# Patient Record
Sex: Female | Born: 1935 | Race: White | Hispanic: No | Marital: Married | Smoking: Former smoker
Health system: Southern US, Community
[De-identification: ages and names within clinical notes are randomized; demographics above are authoritative.]

## PROBLEM LIST (undated history)

## (undated) DIAGNOSIS — H269 Unspecified cataract: Secondary | ICD-10-CM

## (undated) DIAGNOSIS — I10 Essential (primary) hypertension: Secondary | ICD-10-CM

## (undated) DIAGNOSIS — F419 Anxiety disorder, unspecified: Secondary | ICD-10-CM

## (undated) DIAGNOSIS — R404 Transient alteration of awareness: Secondary | ICD-10-CM

## (undated) DIAGNOSIS — F0280 Dementia in other diseases classified elsewhere without behavioral disturbance: Secondary | ICD-10-CM

## (undated) DIAGNOSIS — G309 Alzheimer's disease, unspecified: Secondary | ICD-10-CM

## (undated) DIAGNOSIS — F02818 Dementia in other diseases classified elsewhere, unspecified severity, with other behavioral disturbance: Secondary | ICD-10-CM

## (undated) DIAGNOSIS — Z1211 Encounter for screening for malignant neoplasm of colon: Secondary | ICD-10-CM

## (undated) DIAGNOSIS — F329 Major depressive disorder, single episode, unspecified: Secondary | ICD-10-CM

## (undated) DIAGNOSIS — G4700 Insomnia, unspecified: Secondary | ICD-10-CM

## (undated) DIAGNOSIS — F32A Depression, unspecified: Secondary | ICD-10-CM

## (undated) DIAGNOSIS — M1990 Unspecified osteoarthritis, unspecified site: Secondary | ICD-10-CM

## (undated) DIAGNOSIS — F0281 Dementia in other diseases classified elsewhere with behavioral disturbance: Secondary | ICD-10-CM

## (undated) HISTORY — DX: Dementia in other diseases classified elsewhere with behavioral disturbance: F02.81

## (undated) HISTORY — PX: SMALL INTESTINE SURGERY: SHX150

## (undated) HISTORY — DX: Dementia in other diseases classified elsewhere, unspecified severity, with other behavioral disturbance: F02.818

## (undated) HISTORY — DX: Transient alteration of awareness: R40.4

## (undated) HISTORY — PX: CHOLECYSTECTOMY: SHX55

## (undated) HISTORY — PX: OTHER SURGICAL HISTORY: SHX169

## (undated) HISTORY — DX: Insomnia, unspecified: G47.00

## (undated) HISTORY — DX: Unspecified osteoarthritis, unspecified site: M19.90

## (undated) HISTORY — DX: Major depressive disorder, single episode, unspecified: F32.9

## (undated) HISTORY — DX: Anxiety disorder, unspecified: F41.9

## (undated) HISTORY — DX: Unspecified cataract: H26.9

## (undated) HISTORY — DX: Encounter for screening for malignant neoplasm of colon: Z12.11

## (undated) HISTORY — DX: Depression, unspecified: F32.A

## (undated) HISTORY — DX: Essential (primary) hypertension: I10

---

## 2011-06-15 DIAGNOSIS — M2040 Other hammer toe(s) (acquired), unspecified foot: Secondary | ICD-10-CM | POA: Diagnosis not present

## 2011-06-15 DIAGNOSIS — Q828 Other specified congenital malformations of skin: Secondary | ICD-10-CM | POA: Diagnosis not present

## 2011-06-15 DIAGNOSIS — R262 Difficulty in walking, not elsewhere classified: Secondary | ICD-10-CM | POA: Diagnosis not present

## 2011-06-15 DIAGNOSIS — S93609A Unspecified sprain of unspecified foot, initial encounter: Secondary | ICD-10-CM | POA: Diagnosis not present

## 2011-06-15 DIAGNOSIS — M25579 Pain in unspecified ankle and joints of unspecified foot: Secondary | ICD-10-CM | POA: Diagnosis not present

## 2011-07-19 DIAGNOSIS — F0280 Dementia in other diseases classified elsewhere without behavioral disturbance: Secondary | ICD-10-CM | POA: Diagnosis not present

## 2011-07-19 DIAGNOSIS — G309 Alzheimer's disease, unspecified: Secondary | ICD-10-CM | POA: Diagnosis not present

## 2011-07-27 DIAGNOSIS — N3941 Urge incontinence: Secondary | ICD-10-CM | POA: Diagnosis not present

## 2011-07-27 DIAGNOSIS — R3915 Urgency of urination: Secondary | ICD-10-CM | POA: Diagnosis not present

## 2011-07-27 DIAGNOSIS — N390 Urinary tract infection, site not specified: Secondary | ICD-10-CM | POA: Diagnosis not present

## 2011-07-27 DIAGNOSIS — R300 Dysuria: Secondary | ICD-10-CM | POA: Diagnosis not present

## 2011-07-27 DIAGNOSIS — N8110 Cystocele, unspecified: Secondary | ICD-10-CM | POA: Diagnosis not present

## 2011-08-08 DIAGNOSIS — R300 Dysuria: Secondary | ICD-10-CM | POA: Diagnosis not present

## 2011-08-08 DIAGNOSIS — M25529 Pain in unspecified elbow: Secondary | ICD-10-CM | POA: Diagnosis not present

## 2011-08-08 DIAGNOSIS — R3915 Urgency of urination: Secondary | ICD-10-CM | POA: Diagnosis not present

## 2011-08-08 DIAGNOSIS — I498 Other specified cardiac arrhythmias: Secondary | ICD-10-CM | POA: Diagnosis not present

## 2011-08-08 DIAGNOSIS — S0003XA Contusion of scalp, initial encounter: Secondary | ICD-10-CM | POA: Diagnosis not present

## 2011-08-08 DIAGNOSIS — S0990XA Unspecified injury of head, initial encounter: Secondary | ICD-10-CM | POA: Diagnosis not present

## 2011-08-08 DIAGNOSIS — N3941 Urge incontinence: Secondary | ICD-10-CM | POA: Diagnosis not present

## 2011-08-08 DIAGNOSIS — I440 Atrioventricular block, first degree: Secondary | ICD-10-CM | POA: Diagnosis not present

## 2011-08-08 DIAGNOSIS — R9431 Abnormal electrocardiogram [ECG] [EKG]: Secondary | ICD-10-CM | POA: Diagnosis not present

## 2011-08-08 DIAGNOSIS — W19XXXA Unspecified fall, initial encounter: Secondary | ICD-10-CM | POA: Diagnosis not present

## 2011-08-08 DIAGNOSIS — N390 Urinary tract infection, site not specified: Secondary | ICD-10-CM | POA: Diagnosis not present

## 2011-08-08 DIAGNOSIS — S1093XA Contusion of unspecified part of neck, initial encounter: Secondary | ICD-10-CM | POA: Diagnosis not present

## 2011-08-08 DIAGNOSIS — M5030 Other cervical disc degeneration, unspecified cervical region: Secondary | ICD-10-CM | POA: Diagnosis not present

## 2011-08-10 DIAGNOSIS — S069X9A Unspecified intracranial injury with loss of consciousness of unspecified duration, initial encounter: Secondary | ICD-10-CM | POA: Diagnosis not present

## 2011-09-07 DIAGNOSIS — F0280 Dementia in other diseases classified elsewhere without behavioral disturbance: Secondary | ICD-10-CM | POA: Diagnosis not present

## 2011-09-21 DIAGNOSIS — N8110 Cystocele, unspecified: Secondary | ICD-10-CM | POA: Diagnosis not present

## 2011-09-21 DIAGNOSIS — R3915 Urgency of urination: Secondary | ICD-10-CM | POA: Diagnosis not present

## 2011-09-21 DIAGNOSIS — N3941 Urge incontinence: Secondary | ICD-10-CM | POA: Diagnosis not present

## 2011-10-27 DIAGNOSIS — H26499 Other secondary cataract, unspecified eye: Secondary | ICD-10-CM | POA: Diagnosis not present

## 2011-12-18 DIAGNOSIS — G309 Alzheimer's disease, unspecified: Secondary | ICD-10-CM | POA: Diagnosis not present

## 2011-12-18 DIAGNOSIS — F0280 Dementia in other diseases classified elsewhere without behavioral disturbance: Secondary | ICD-10-CM | POA: Diagnosis not present

## 2011-12-25 DIAGNOSIS — L259 Unspecified contact dermatitis, unspecified cause: Secondary | ICD-10-CM | POA: Diagnosis not present

## 2012-01-08 DIAGNOSIS — N8110 Cystocele, unspecified: Secondary | ICD-10-CM | POA: Diagnosis not present

## 2012-01-08 DIAGNOSIS — N390 Urinary tract infection, site not specified: Secondary | ICD-10-CM | POA: Diagnosis not present

## 2012-01-08 DIAGNOSIS — R3915 Urgency of urination: Secondary | ICD-10-CM | POA: Diagnosis not present

## 2012-01-08 DIAGNOSIS — N3941 Urge incontinence: Secondary | ICD-10-CM | POA: Diagnosis not present

## 2012-01-08 DIAGNOSIS — R300 Dysuria: Secondary | ICD-10-CM | POA: Diagnosis not present

## 2012-01-30 DIAGNOSIS — R3915 Urgency of urination: Secondary | ICD-10-CM | POA: Diagnosis not present

## 2012-01-30 DIAGNOSIS — N3941 Urge incontinence: Secondary | ICD-10-CM | POA: Diagnosis not present

## 2012-02-13 DIAGNOSIS — F0280 Dementia in other diseases classified elsewhere without behavioral disturbance: Secondary | ICD-10-CM | POA: Diagnosis not present

## 2012-02-26 DIAGNOSIS — L98499 Non-pressure chronic ulcer of skin of other sites with unspecified severity: Secondary | ICD-10-CM | POA: Diagnosis not present

## 2012-02-26 DIAGNOSIS — D485 Neoplasm of uncertain behavior of skin: Secondary | ICD-10-CM | POA: Diagnosis not present

## 2012-02-26 DIAGNOSIS — L259 Unspecified contact dermatitis, unspecified cause: Secondary | ICD-10-CM | POA: Diagnosis not present

## 2012-02-26 DIAGNOSIS — L568 Other specified acute skin changes due to ultraviolet radiation: Secondary | ICD-10-CM | POA: Diagnosis not present

## 2012-03-15 DIAGNOSIS — S62309A Unspecified fracture of unspecified metacarpal bone, initial encounter for closed fracture: Secondary | ICD-10-CM | POA: Diagnosis not present

## 2012-03-18 DIAGNOSIS — IMO0002 Reserved for concepts with insufficient information to code with codable children: Secondary | ICD-10-CM | POA: Diagnosis not present

## 2012-04-01 DIAGNOSIS — S62309A Unspecified fracture of unspecified metacarpal bone, initial encounter for closed fracture: Secondary | ICD-10-CM | POA: Diagnosis not present

## 2012-04-15 DIAGNOSIS — N3941 Urge incontinence: Secondary | ICD-10-CM | POA: Diagnosis not present

## 2012-04-15 DIAGNOSIS — R3915 Urgency of urination: Secondary | ICD-10-CM | POA: Diagnosis not present

## 2012-05-14 DIAGNOSIS — G309 Alzheimer's disease, unspecified: Secondary | ICD-10-CM | POA: Diagnosis not present

## 2012-05-14 DIAGNOSIS — F0280 Dementia in other diseases classified elsewhere without behavioral disturbance: Secondary | ICD-10-CM | POA: Diagnosis not present

## 2012-07-24 DIAGNOSIS — R351 Nocturia: Secondary | ICD-10-CM | POA: Diagnosis not present

## 2012-07-24 DIAGNOSIS — N3941 Urge incontinence: Secondary | ICD-10-CM | POA: Diagnosis not present

## 2012-07-27 DIAGNOSIS — IMO0001 Reserved for inherently not codable concepts without codable children: Secondary | ICD-10-CM | POA: Diagnosis not present

## 2012-07-27 DIAGNOSIS — F329 Major depressive disorder, single episode, unspecified: Secondary | ICD-10-CM | POA: Diagnosis not present

## 2012-07-27 DIAGNOSIS — R41841 Cognitive communication deficit: Secondary | ICD-10-CM | POA: Diagnosis not present

## 2012-07-27 DIAGNOSIS — F0280 Dementia in other diseases classified elsewhere without behavioral disturbance: Secondary | ICD-10-CM | POA: Diagnosis not present

## 2012-07-27 DIAGNOSIS — G309 Alzheimer's disease, unspecified: Secondary | ICD-10-CM | POA: Diagnosis not present

## 2012-07-31 DIAGNOSIS — I10 Essential (primary) hypertension: Secondary | ICD-10-CM | POA: Diagnosis not present

## 2012-07-31 DIAGNOSIS — F411 Generalized anxiety disorder: Secondary | ICD-10-CM | POA: Diagnosis not present

## 2012-07-31 DIAGNOSIS — G319 Degenerative disease of nervous system, unspecified: Secondary | ICD-10-CM | POA: Diagnosis not present

## 2012-07-31 DIAGNOSIS — K5900 Constipation, unspecified: Secondary | ICD-10-CM | POA: Diagnosis not present

## 2012-08-06 DIAGNOSIS — IMO0001 Reserved for inherently not codable concepts without codable children: Secondary | ICD-10-CM | POA: Diagnosis not present

## 2012-08-12 DIAGNOSIS — IMO0001 Reserved for inherently not codable concepts without codable children: Secondary | ICD-10-CM | POA: Diagnosis not present

## 2012-08-12 DIAGNOSIS — R41841 Cognitive communication deficit: Secondary | ICD-10-CM | POA: Diagnosis not present

## 2012-08-12 DIAGNOSIS — I10 Essential (primary) hypertension: Secondary | ICD-10-CM | POA: Diagnosis not present

## 2012-08-12 DIAGNOSIS — R32 Unspecified urinary incontinence: Secondary | ICD-10-CM | POA: Diagnosis not present

## 2012-08-12 DIAGNOSIS — F329 Major depressive disorder, single episode, unspecified: Secondary | ICD-10-CM | POA: Diagnosis not present

## 2012-08-12 DIAGNOSIS — R404 Transient alteration of awareness: Secondary | ICD-10-CM | POA: Diagnosis not present

## 2012-08-12 DIAGNOSIS — F0280 Dementia in other diseases classified elsewhere without behavioral disturbance: Secondary | ICD-10-CM | POA: Diagnosis not present

## 2012-08-15 ENCOUNTER — Ambulatory Visit (INDEPENDENT_AMBULATORY_CARE_PROVIDER_SITE_OTHER): Payer: Medicare Other | Admitting: Family Medicine

## 2012-08-15 VITALS — BP 162/64 | HR 62 | Temp 97.1°F | Resp 16 | Ht 60.0 in | Wt 137.0 lb

## 2012-08-15 DIAGNOSIS — S058X9A Other injuries of unspecified eye and orbit, initial encounter: Secondary | ICD-10-CM | POA: Diagnosis not present

## 2012-08-15 DIAGNOSIS — H01009 Unspecified blepharitis unspecified eye, unspecified eyelid: Secondary | ICD-10-CM | POA: Diagnosis not present

## 2012-08-15 DIAGNOSIS — H01003 Unspecified blepharitis right eye, unspecified eyelid: Secondary | ICD-10-CM

## 2012-08-15 MED ORDER — ERYTHROMYCIN 5 MG/GM OP OINT: TOPICAL_OINTMENT | OPHTHALMIC | Status: DC

## 2012-08-15 NOTE — Progress Notes (Signed)
  Subjective:    Patient ID: Charlotte Kelly, female    DOB: 11-28-1929, 78 y.o.   MRN: [id number]  HPI Charlotte Kelly is a 79 y.o. female Hx of dementia. Here with caregiver, and husband.   Complains of R eye redness, itchy - starting past 2 days. Noticed by caregivers this  New to the area. Primary care: [facility].   No change in vision subjectively. Difficultwith testing with dementis.   Review of Systems No fever, no discharge. No reported change in visual acuity or photophobia.     Objective:   Physical Exam  Vitals reviewed. Constitutional: She appears well-developed and well-nourished.  HENT:  Head: Normocephalic and atraumatic.  Right Ear: External ear normal.  Left Ear: External ear normal.  Mouth/Throat: Oropharynx is clear and moist.  No facial rash. No nasal rash.   Eyes: EOM are normal. Pupils are equal, round, and reactive to light. No foreign bodies found. Right eye exhibits no discharge, no exudate and no hordeolum. No foreign body present in the right eye. Right conjunctiva is injected (minimal injection.). Right conjunctiva has no hemorrhage.        Assessment & Plan:  Charlotte Kelly is a 79 y.o. female Blepharitis of right eye - Plan: erythromycin (ROMYCIN) ophthalmic ointment, Ambulatory referral to Ophthalmology  Corneal abrasion, right, initial encounter - Plan: Ambulatory referral to Ophthalmology   Suspected intial allergic irritation vs blepharitis of R eyelid then abrasion with scratching.  Tolerated testing ok, cooperative with exam.  Will have followed up with opthalmology tomorrow, or rtc with me to recheck this weekend if unable to get there d/t weather. Caregiver and husband verified understanding.   Meds ordered this encounter  Medications  . donepezil (ARICEPT) 10 MG tablet    Sig: Take 10 mg by mouth at bedtime as needed.  Marland Kitchen lisinopril (PRINIVIL,ZESTRIL) 10 MG tablet    Sig: Take 10 mg by mouth daily.  . memantine (NAMENDA) 10 MG  tablet    Sig: Take 10 mg by mouth 2 (two) times daily.  . sertraline (ZOLOFT) 100 MG tablet    Sig: Take 100 mg by mouth daily.  . trospium (SANCTURA) 20 MG tablet    Sig: Take 20 mg by mouth 2 (two) times daily.  Marland Kitchen erythromycin [facility]) ophthalmic ointment    Sig: 1cm ribbon to lower conjunctival sac every 6 hours for 1 week.    Dispense:  3.5 g    Refill:  0  .

## 2012-08-15 NOTE — Patient Instructions (Signed)
  Discussed with husband - see a/p. Unable to print before left office.

## 2012-08-21 DIAGNOSIS — R351 Nocturia: Secondary | ICD-10-CM | POA: Diagnosis not present

## 2012-08-21 DIAGNOSIS — N3941 Urge incontinence: Secondary | ICD-10-CM | POA: Diagnosis not present

## 2012-08-23 DIAGNOSIS — Z961 Presence of intraocular lens: Secondary | ICD-10-CM | POA: Diagnosis not present

## 2012-08-23 DIAGNOSIS — S058X9A Other injuries of unspecified eye and orbit, initial encounter: Secondary | ICD-10-CM | POA: Diagnosis not present

## 2012-08-27 ENCOUNTER — Encounter: Payer: Self-pay | Admitting: Nurse Practitioner

## 2012-08-27 ENCOUNTER — Ambulatory Visit (INDEPENDENT_AMBULATORY_CARE_PROVIDER_SITE_OTHER): Payer: Medicare Other | Admitting: Nurse Practitioner

## 2012-08-27 VITALS — BP 122/86 | HR 68 | Temp 97.6°F | Ht 60.0 in | Wt 140.8 lb

## 2012-08-27 DIAGNOSIS — F411 Generalized anxiety disorder: Secondary | ICD-10-CM

## 2012-08-27 DIAGNOSIS — G4700 Insomnia, unspecified: Secondary | ICD-10-CM | POA: Diagnosis not present

## 2012-08-27 DIAGNOSIS — F0281 Dementia in other diseases classified elsewhere with behavioral disturbance: Secondary | ICD-10-CM

## 2012-08-27 DIAGNOSIS — F419 Anxiety disorder, unspecified: Secondary | ICD-10-CM | POA: Insufficient documentation

## 2012-08-27 MED ORDER — QUETIAPINE FUMARATE 25 MG PO TABS: ORAL_TABLET | ORAL | Status: DC

## 2012-08-27 NOTE — Progress Notes (Signed)
  Subjective:    Patient ID: Charlotte Kelly, female    DOB: [birth year], 79 y.o.   MRN: [id number]  HPI 79 year old female was started on Risperdal due to behaviors and sleep distances however after 1-2 weeks into taking medication pt became more confused and could not finish a thought but did help her sleep. Now that she is off medication she is clearer during the day but is not sleeping at night.  Still with crying spells, meltdowns and agitation during the day. PRN ativan given which helps with this.    Review of Systems  Constitutional: Negative.   Respiratory: Negative.   Cardiovascular: Negative.   Gastrointestinal: Negative for diarrhea and constipation (has been taking colace daily with good results).       Acid reflux   Neurological: Negative for speech difficulty, light-headedness and headaches. Dizziness: when pt has to be redirected she has a tendency to hit and increased agitation   Psychiatric/Behavioral: Positive for behavioral problems (when pt has to be redirected she has a tendency to be aggressive with caregiver and husband. Also with increased agitation ), confusion and agitation. The patient is nervous/anxious.        Objective:   Physical Exam  Constitutional: She appears well-developed and well-nourished. No distress.  HENT:  Head: Normocephalic and atraumatic.  Eyes: Pupils are equal, round, and reactive to light.  Neck: Normal range of motion. Neck supple.  Cardiovascular: Normal rate and regular rhythm.   Skin: She is not diaphoretic.  Psychiatric: Her speech is normal. Her mood appears anxious. Cognition and memory are impaired. She expresses inappropriate judgment. She exhibits abnormal recent memory and abnormal remote memory.          Assessment & Plan:  Dementia with behaviors- Risperdal was stopped due to side effects. At this time will start Seroquel 25mg  qhs and encourage a good sleep wake cycle. To cont to use ativan PRN

## 2012-08-27 NOTE — Patient Instructions (Signed)
Pt to follow up in 2 weeks with Dr Renato Gails if possible  To take Seroquel 73mins-1 hour before bedtime.

## 2012-09-02 ENCOUNTER — Other Ambulatory Visit: Payer: Self-pay | Admitting: Geriatric Medicine

## 2012-09-02 ENCOUNTER — Other Ambulatory Visit: Payer: Self-pay | Admitting: Nurse Practitioner

## 2012-09-02 MED ORDER — DONEPEZIL HCL 10 MG PO TABS: ORAL_TABLET | ORAL | Status: DC

## 2012-09-06 ENCOUNTER — Other Ambulatory Visit: Payer: Self-pay | Admitting: *Deleted

## 2012-09-06 DIAGNOSIS — F3289 Other specified depressive episodes: Secondary | ICD-10-CM

## 2012-09-06 DIAGNOSIS — F0280 Dementia in other diseases classified elsewhere without behavioral disturbance: Secondary | ICD-10-CM

## 2012-09-06 DIAGNOSIS — I10 Essential (primary) hypertension: Secondary | ICD-10-CM

## 2012-09-06 DIAGNOSIS — F329 Major depressive disorder, single episode, unspecified: Secondary | ICD-10-CM

## 2012-09-12 ENCOUNTER — Ambulatory Visit (INDEPENDENT_AMBULATORY_CARE_PROVIDER_SITE_OTHER): Payer: Medicare Other | Admitting: Internal Medicine

## 2012-09-12 ENCOUNTER — Encounter: Payer: Self-pay | Admitting: Internal Medicine

## 2012-09-12 VITALS — BP 116/68 | HR 49 | Temp 98.1°F | Resp 14 | Ht 59.0 in | Wt 136.0 lb

## 2012-09-12 DIAGNOSIS — F02818 Dementia in other diseases classified elsewhere, unspecified severity, with other behavioral disturbance: Secondary | ICD-10-CM | POA: Diagnosis not present

## 2012-09-12 DIAGNOSIS — F0281 Dementia in other diseases classified elsewhere with behavioral disturbance: Secondary | ICD-10-CM | POA: Diagnosis not present

## 2012-09-12 DIAGNOSIS — IMO0001 Reserved for inherently not codable concepts without codable children: Secondary | ICD-10-CM

## 2012-09-12 DIAGNOSIS — F411 Generalized anxiety disorder: Secondary | ICD-10-CM

## 2012-09-12 DIAGNOSIS — M791 Myalgia, unspecified site: Secondary | ICD-10-CM

## 2012-09-12 DIAGNOSIS — N390 Urinary tract infection, site not specified: Secondary | ICD-10-CM

## 2012-09-12 DIAGNOSIS — F419 Anxiety disorder, unspecified: Secondary | ICD-10-CM

## 2012-09-12 DIAGNOSIS — G4700 Insomnia, unspecified: Secondary | ICD-10-CM

## 2012-09-12 MED ORDER — LISINOPRIL 10 MG PO TABS: 10.0000 mg | ORAL_TABLET | Freq: Every day | ORAL | Status: DC

## 2012-09-12 NOTE — Assessment & Plan Note (Signed)
On zoloft for periods of tearfulness and to help decrease her anxiety levels, improve her mood.  Also on prn ativan at [facility]--dosage not known (somehow not entered upon arrival today).

## 2012-09-12 NOTE — Assessment & Plan Note (Signed)
Improved with seroquel 25mg  po qhs.

## 2012-09-12 NOTE — Progress Notes (Signed)
Patient ID: Charlotte Kelly, female   DOB: 1929/10/26, 79 y.o.   MRN: [id number]  Urine dipstick with 75 wbc, cloudy, neg nitrite, 30 protein, 5-10 blood--doubt UTI, but urine c+s was sent due to increased incontinence and worsened confusion.

## 2012-09-12 NOTE — Assessment & Plan Note (Signed)
Moderate to severe stage.  Is still able to do her adls with prompting, eat on her own, does communicate some verbally and able to ambulate without assistive device though unsteady.  Is on aricept and namenda.  Is on seroquel for her insomnia with benefit.  Is on ativan for her periods of agitation, but this is making her sleepy and unlikely to help her balance.  Discussed that if her cognition is not acutely worse from UTI (if urine cx comes back negative), would add depakote and try to avoid use of ativan.  Hopefully, depakote will make her more redirectable during these periods

## 2012-09-12 NOTE — Patient Instructions (Addendum)
Please bring a copy of your living will and HCPOA.    Use heat and topical agents for right rib muscle pain.    We will let you know the urine culture results.  If they are negative, I may add depakote to her regimen and try to get her off the ativan.

## 2012-09-12 NOTE — Progress Notes (Signed)
Patient ID: Charlotte Kelly, female   DOB: [birth year], 79 y.o.   MRN: [id number] Code Status:  Living will--no extraordinary efforts if cannot be reasonably prolonged, will bring copy of living will and HCPOA which is daughter;  They think she has DNR code status.    No Known Allergies  Chief Complaint  Patient presents with  . Urinary Tract Infection    right side and back pain  . Memory Loss    agitated, combative, resistant, and depressed    HPI: Patient is a 79 y.o. Caucasian female seen in the office today for possible UTI and dementia behaviors.  Lives at [facility] with her husband.    Back hurts around the left side and into the back.  Pt unable to provide good history.  Had been rummaging through shelves up high in the kitchen yesterday off to the right side.  Pain just started last night and this am.    Dislikes water, and aide encourages her.  Does ok if it has a little splenda in it.    Has had a few bouts of bowel and bladder incontinence.    Denies abdominal pain.  Colace working well for bowels--has large stools.    Seroquel 25mg  is helping sleep considerably.  Still working on time table to get her to sleep.  Going to give about 30 mins before expect her to go to bed.  Will try to fight through it.  Aide notes strong constitution.    Does not feel sad, but is sometimes tearful.  Happened over weekend.    Combativeness remains problematic at times.  [facility] not do well at [employer]--laid down on ground.  Needed two girls to help her get to the car.  Is angry and agitated at times.  Ativan has been helping her be more redirectable.  Wants to take people's purses, walkers, etc.  Ativan only lasts about 3.5 hrs.   No paranoia.  Has had a couple of episodes of hallucinations.  Was first diagnosed 4+ years ago with dementia.  Was part of a drug trial and was on placebo for it.  Was then eligible for drug--got two infusions---apiluximab.  Now in another trial since last year...took  bid--did not tolerate it.  Had not been on any meds except aricept (started at outset) and namenda started about a year ago.  Has just made urine sample.  Pt has living will.  Asked them to provide a copy please.  Daughter works at [employer].    Balance is poor.  Wonder about vertigo.    Review of Systems:  Review of Systems  Constitutional: Negative for fever, chills and weight loss.  HENT: Negative for congestion.   Eyes: Negative for blurred vision.  Respiratory: Negative for cough and shortness of breath.   Cardiovascular: Negative for chest pain, palpitations and leg swelling.  Gastrointestinal: Negative for heartburn, nausea, vomiting and abdominal pain.  Genitourinary: Positive for urgency, frequency and flank pain. Negative for dysuria.       Increased incontinence  Musculoskeletal: Positive for myalgias and back pain.       Pain over ribs on right  Skin: Negative for rash.  Neurological: Negative for dizziness, tremors, focal weakness, weakness and headaches.  Endo/Heme/Allergies: Does not bruise/bleed easily.  Psychiatric/Behavioral: Positive for depression, hallucinations and memory loss. The patient is nervous/anxious and has insomnia.        Insomnia improved with seroquel     Past Medical History  Diagnosis Date  .  Arthritis   . Depression   . Cataract   . Dementia in conditions classified elsewhere with behavioral disturbance(294.11)   . Anxiety   . Insomnia    Past Surgical History  Procedure Laterality Date  . Gallblader     Social History:   reports that she has quit smoking. She does not have any smokeless tobacco history on file. She reports that she does not drink alcohol or use illicit drugs.  Family History  Problem Relation Age of Onset  . Heart failure Father     Medications: Patient's Medications  New Prescriptions   No medications on file  Previous Medications   ASPIRIN 81 MG TABLET    Take 81 mg by mouth daily. Take one tablet  once daily to prevent stroke   B COMPLEX-C (SUPER B COMPLEX PO)    Take by mouth. Take one tablet once daily   CHOLECALCIFEROL (VITAMIN D) 2000 UNITS TABLET    Take 2,000 Units by mouth daily. Take one tablet once daily for vitamin d   CRANBERRY 250 MG CAPS    Take by mouth. Take one tablet once daily to prevent UTI   DOCUSATE SODIUM (COLACE) 100 MG CAPSULE    Take 100 mg by mouth 2 (two) times daily. Take one tablet once daily for irregularity   DONEPEZIL (ARICEPT) 10 MG TABLET    Take one tablet by mouth daily for memory.   FISH OIL-OMEGA-3 FATTY ACIDS 1000 MG CAPSULE    Take 2 g by mouth daily.   MEMANTINE (NAMENDA) 10 MG TABLET    Take 10 mg by mouth 2 (two) times daily.   MULTIVITAMIN-IRON-MINERALS-FOLIC ACID (CENTRUM) CHEWABLE TABLET    Chew 1 tablet by mouth daily. Take one tablet once daily for supplement   QUETIAPINE (SEROQUEL) 25 MG TABLET    Take 1 tablet at night by mouth for sleep and behaviors   SERTRALINE (ZOLOFT) 100 MG TABLET    Take 100 mg by mouth daily.   SERTRALINE (ZOLOFT) 25 MG TABLET    Take 25 mg by mouth daily. Take one tablet once daily for depression   TROSPIUM (SANCTURA) 20 MG TABLET    Take 20 mg by mouth 2 (two) times daily.   VITAMIN B-12 (CYANOCOBALAMIN) 500 MCG TABLET    Take 500 mcg by mouth daily. Take one tablet once daily for vitamin B12  Modified Medications   Modified Medication Previous Medication   LISINOPRIL (PRINIVIL,ZESTRIL) 10 MG TABLET lisinopril (PRINIVIL,ZESTRIL) 10 MG tablet      Take 1 tablet (10 mg total) by mouth daily.    Take 10 mg by mouth daily.  Discontinued Medications   No medications on file     Physical Exam:  Filed Vitals:   09/12/12 1148  BP: 116/68  Pulse: 49  Temp: 98.1 F (36.7 C)  TempSrc: Oral  Resp: 14  Height: 4\' 11"  (1.499 m)  Weight: 136 lb (61.689 kg)  SpO2: 97%   Physical Exam  Constitutional: She appears well-developed and well-nourished. No distress.  HENT:  Head: Normocephalic and atraumatic.  Eyes:  EOM are normal. Pupils are equal, round, and reactive to light.  Cardiovascular: Normal rate, regular rhythm, normal heart sounds and intact distal pulses.  Exam reveals no gallop and no friction rub.   No murmur heard. Pulmonary/Chest: Effort normal and breath sounds normal. No respiratory distress.  Abdominal: Soft. Bowel sounds are normal. She exhibits no distension and no mass. There is no tenderness.  Genitourinary:  No clear  CVA tenderness  Musculoskeletal: Normal range of motion.  Tender over right lower ribs   Neurological: She is alert. No cranial nerve deficit. Coordination abnormal.  Skin: Skin is warm and dry. No rash noted. She is not diaphoretic.  Psychiatric: Her mood appears not anxious. Her affect is inappropriate. She is actively hallucinating. Thought content is not paranoid and not delusional. Cognition and memory are impaired. She expresses impulsivity and inappropriate judgment. She does not exhibit a depressed mood. She exhibits abnormal recent memory.  Appears to see things at times Speech not always clear Pleasant today, not agitated during visit, but is ready to get up and go quite often She is inattentive.    Labs reviewed: Cbc nl TSH 5.050 Glucose 114 Cr 0.7   Assessment/Plan Dementia in conditions classified elsewhere with behavioral disturbance(294.11) Moderate to severe stage.  Is still able to do her adls with prompting, eat on her own, does communicate some verbally and able to ambulate without assistive device though unsteady.  Is on aricept and namenda.  Is on seroquel for her insomnia with benefit.  Is on ativan for her periods of agitation, but this is making her sleepy and unlikely to help her balance.  Discussed that if her cognition is not acutely worse from UTI (if urine cx comes back negative), would add depakote and try to avoid use of ativan.  Hopefully, depakote will make her more redirectable during these periods  Anxiety On zoloft for  periods of tearfulness and to help decrease her anxiety levels, improve her mood.  Also on prn ativan at [facility]--dosage not known (somehow not entered upon arrival today).    Insomnia Improved with seroquel 25mg  po qhs.  Muscle pain:  Over right ribs--advised to use heat, icy hot/aspercreme/muscle rub and tylenol as needed not to exceed 3g  husband will bring in living will/hcpoa paperwork for her.    Labs/tests ordered:  None at present.

## 2012-09-13 ENCOUNTER — Other Ambulatory Visit: Payer: Self-pay | Admitting: Nurse Practitioner

## 2012-09-16 LAB — URINE CULTURE

## 2012-09-17 ENCOUNTER — Other Ambulatory Visit: Payer: Self-pay | Admitting: Geriatric Medicine

## 2012-09-17 ENCOUNTER — Other Ambulatory Visit: Payer: Medicare Other

## 2012-09-17 DIAGNOSIS — F0280 Dementia in other diseases classified elsewhere without behavioral disturbance: Secondary | ICD-10-CM

## 2012-09-17 DIAGNOSIS — F329 Major depressive disorder, single episode, unspecified: Secondary | ICD-10-CM

## 2012-09-17 DIAGNOSIS — I10 Essential (primary) hypertension: Secondary | ICD-10-CM | POA: Diagnosis not present

## 2012-09-17 MED ORDER — QUETIAPINE FUMARATE 25 MG PO TABS: ORAL_TABLET | ORAL | Status: DC

## 2012-09-17 MED ORDER — SACCHAROMYCES BOULARDII 250 MG PO CAPS: ORAL_CAPSULE | ORAL | Status: DC

## 2012-09-17 MED ORDER — CIPROFLOXACIN HCL 500 MG PO TABS: ORAL_TABLET | ORAL | Status: DC

## 2012-09-18 LAB — COMPREHENSIVE METABOLIC PANEL
ALT: 17 IU/L (ref 0–32)
AST: 16 IU/L (ref 0–40)
Albumin/Globulin Ratio: 1.3 (ref 1.1–2.5)
CO2: 25 mmol/L (ref 19–28)
Calcium: 9.3 mg/dL (ref 8.6–10.2)
Creatinine, Ser: 0.84 mg/dL (ref 0.57–1.00)
GFR calc non Af Amer: 66 mL/min/{1.73_m2} (ref >59)
Globulin, Total: 3.1 g/dL (ref 1.5–4.5)
Glucose: 137 mg/dL — ABNORMAL HIGH (ref 65–99)
Total Protein: 7.2 g/dL (ref 6.0–8.5)

## 2012-09-18 LAB — TSH: TSH: 3.93 u[IU]/mL (ref 0.450–4.500)

## 2012-09-18 LAB — CBC WITH DIFFERENTIAL/PLATELET
Basophils Absolute: 0 10*3/uL (ref 0.0–0.2)
Basos: 0 % (ref 0–3)
HCT: 40.3 % (ref 34.0–46.6)
Hemoglobin: 12.9 g/dL (ref 11.1–15.9)
Lymphocytes Absolute: 1.8 10*3/uL (ref 0.7–3.1)
Lymphs: 26 % (ref 14–46)
MCH: 29.7 pg (ref 26.6–33.0)
MCHC: 32 g/dL (ref 31.5–35.7)
Monocytes: 7 % (ref 4–12)
Neutrophils Absolute: 4.6 10*3/uL (ref 1.4–7.0)
RBC: 4.35 x10E6/uL (ref 3.77–5.28)

## 2012-09-18 LAB — LIPID PANEL
Cholesterol, Total: 233 mg/dL — ABNORMAL HIGH (ref 100–199)
HDL: 37 mg/dL — ABNORMAL LOW (ref >39)
LDL Calculated: 149 mg/dL — ABNORMAL HIGH (ref 0–99)
Triglycerides: 236 mg/dL — ABNORMAL HIGH (ref 0–149)
VLDL Cholesterol Cal: 47 mg/dL — ABNORMAL HIGH (ref 5–40)

## 2012-09-19 ENCOUNTER — Encounter: Payer: Self-pay | Admitting: Nurse Practitioner

## 2012-09-19 ENCOUNTER — Ambulatory Visit (INDEPENDENT_AMBULATORY_CARE_PROVIDER_SITE_OTHER): Payer: Medicare Other | Admitting: Nurse Practitioner

## 2012-09-19 VITALS — BP 144/86 | HR 72 | Temp 98.1°F | Resp 16 | Ht 60.0 in | Wt 135.0 lb

## 2012-09-19 DIAGNOSIS — E785 Hyperlipidemia, unspecified: Secondary | ICD-10-CM

## 2012-09-19 DIAGNOSIS — N390 Urinary tract infection, site not specified: Secondary | ICD-10-CM | POA: Insufficient documentation

## 2012-09-19 DIAGNOSIS — F0281 Dementia in other diseases classified elsewhere with behavioral disturbance: Secondary | ICD-10-CM | POA: Diagnosis not present

## 2012-09-19 DIAGNOSIS — F411 Generalized anxiety disorder: Secondary | ICD-10-CM

## 2012-09-19 DIAGNOSIS — I10 Essential (primary) hypertension: Secondary | ICD-10-CM | POA: Diagnosis not present

## 2012-09-19 DIAGNOSIS — G4700 Insomnia, unspecified: Secondary | ICD-10-CM

## 2012-09-19 DIAGNOSIS — F02818 Dementia in other diseases classified elsewhere, unspecified severity, with other behavioral disturbance: Secondary | ICD-10-CM

## 2012-09-19 DIAGNOSIS — F419 Anxiety disorder, unspecified: Secondary | ICD-10-CM

## 2012-09-19 DIAGNOSIS — Z1211 Encounter for screening for malignant neoplasm of colon: Secondary | ICD-10-CM

## 2012-09-19 HISTORY — DX: Encounter for screening for malignant neoplasm of colon: Z12.11

## 2012-09-19 MED ORDER — SIMVASTATIN 20 MG PO TABS: 20.0000 mg | ORAL_TABLET | Freq: Every day | ORAL | Status: DC

## 2012-09-19 MED ORDER — NITROFURANTOIN MONOHYD MACRO 100 MG PO CAPS: 100.0000 mg | ORAL_CAPSULE | Freq: Every day | ORAL | Status: DC

## 2012-09-19 NOTE — Assessment & Plan Note (Signed)
Cont cipro until treatments complete. Then will start macrodantin 100mg  hs and increase hydration to prevent future UTI

## 2012-09-19 NOTE — Assessment & Plan Note (Signed)
Improved now that she is on Seroquel

## 2012-09-19 NOTE — Patient Instructions (Addendum)
After finish cipro use macrodantin 100mg  at night to prevent future urinary track infection Increase water for good hydration and to flush bladder Labs were not fasting- to come back and redraw fasting lab   Cardiac Diet This diet can help prevent heart disease and stroke. Many factors influence your heart health, including eating and exercise habits. Coronary risk rises a lot with abnormal blood fat (lipid) levels. Cardiac meal planning includes limiting unhealthy fats, increasing healthy fats, and making other small dietary changes. General guidelines are as follows:  Adjust calorie intake to reach and maintain desirable body weight.  Limit total fat intake to less than 30% of total calories. Saturated fat should be less than 7% of calories.  Saturated fats are found in animal products and in some vegetable products. Saturated vegetable fats are found in coconut oil, cocoa butter, palm oil, and palm kernel oil. Read labels carefully to avoid these products as much as possible. Use butter in moderation. Choose tub margarines and oils that have 2 grams of fat or less. Good cooking oils are canola and olive oils.  Practice low-fat cooking techniques. Do not fry food. Instead, broil, bake, boil, steam, grill, roast on a rack, stir-fry, or microwave it. Other fat reducing suggestions include:  Remove the skin from poultry.  Remove all visible fat from meats.  Skim the fat off stews, soups, and gravies before serving them.  Steam vegetables in water or broth instead of sauting them in fat.  Avoid foods with trans fat (or hydrogenated oils), such as commercially fried foods and commercially baked goods. Commercial shortening and deep-frying fats will contain trans fat.  Increase intake of fruits, vegetables, whole grains, and legumes to replace foods high in fat.  Increase consumption of nuts, legumes, and seeds to at least 4 servings weekly. One serving of a legume equals  cup, and 1  serving of nuts or seeds equals  cup.  Choose whole grains more often. Have 3 servings per day (a serving is 1 ounce [oz]).  Eat 4 to 5 servings of vegetables per day. A serving of vegetables is 1 cup of raw leafy vegetables;  cup of raw or cooked cut-up vegetables;  cup of vegetable juice.  Eat 4 to 5 servings of fruit per day. A serving of fruit is 1 medium whole fruit;  cup of dried fruit;  cup of fresh, frozen, or canned fruit;  cup of 100% fruit juice.  Increase your intake of dietary fiber to 20 to 30 grams per day. Insoluble fiber may help lower your risk of heart disease and may help curb your appetite. Soluble fiber binds cholesterol to be removed from the blood. Foods high in soluble fiber are dried beans, citrus fruits, oats, apples, bananas, broccoli, Brussels sprouts, and eggplant.  Try to include foods fortified with plant sterols or stanols, such as yogurt, breads, juices, or margarines. Choose several fortified foods to achieve a daily intake of 2 to 3 grams of plant sterols or stanols.  Foods with omega-3 fats can help reduce your risk of heart disease. Aim to have a 3.5 oz portion of fatty fish twice per week, such as salmon, mackerel, albacore tuna, sardines, lake trout, or herring. If you wish to take a fish oil supplement, choose one that contains 1 gram of both DHA and EPA.  Limit processed meats to 2 servings (3 oz portion) weekly.  Limit the sodium in your diet to 1500 milligrams (mg) per day. If you have high blood pressure,  talk to a registered dietitian about a DASH (Dietary Approaches to Stop Hypertension) eating plan.  Limit sweets and beverages with added sugar, such as soda, to no more than 5 servings per week. One serving is:   1 tablespoon sugar.  1 tablespoon jelly or jam.   cup sorbet.  1 cup lemonade.   cup regular soda. CHOOSING FOODS Starches  Allowed: Breads: All kinds (wheat, rye, raisin, white, oatmeal, [place], [place], and English  muffin bread). Low-fat rolls: English muffins, frankfurter and hamburger buns, bagels, pita bread, tortillas (not fried). Pancakes, waffles, biscuits, and muffins made with recommended oil.  Avoid: Products made with saturated or trans fats, oils, or whole milk products. Butter rolls, cheese breads, croissants. Commercial doughnuts, muffins, sweet rolls, biscuits, waffles, pancakes, store-bought mixes. Crackers  Allowed: Low-fat crackers and snacks: Animal, graham, rye, saltine (with recommended oil, no lard), oyster, and matzo crackers. Bread sticks, melba toast, rusks, flatbread, pretzels, and light popcorn.  Avoid: High-fat crackers: cheese crackers, butter crackers, and those made with coconut, palm oil, or trans fat (hydrogenated oils). Buttered popcorn. Cereals  Allowed: Hot or cold whole-grain cereals.  Avoid: Cereals containing coconut, hydrogenated vegetable fat, or animal fat. Potatoes / Pasta / Rice  Allowed: All kinds of potatoes, rice, and pasta (such as macaroni, spaghetti, and noodles).  Avoid: Pasta or rice prepared with cream sauce or high-fat cheese. Chow mein noodles, [place] fries. Vegetables  Allowed: All vegetables and vegetable juices.  Avoid: Fried vegetables. Vegetables in cream, butter, or high-fat cheese sauces. Limit coconut. Fruit in cream or custard. Protein  Allowed: Limit your intake of meat, seafood, and poultry to no more than 6 oz (cooked weight) per day. All lean, well-trimmed beef, veal, pork, and lamb. All chicken and [place] without skin. All fish and shellfish. Wild game: wild duck, rabbit, pheasant, and venison. Egg whites or low-cholesterol egg substitutes may be used as desired. Meatless dishes: recipes with dried beans, peas, lentils, and tofu (soybean curd). Seeds and nuts: all seeds and most nuts.  Avoid: Prime grade and other heavily marbled and fatty meats, such as short ribs, spare ribs, rib eye roast or steak, frankfurters, sausage, bacon,  and high-fat luncheon meats, mutton. Caviar. Commercially fried fish. Domestic duck, goose, venison sausage. Organ meats: liver, gizzard, heart, chitterlings, brains, kidney, sweetbreads. Dairy  Allowed: Low-fat cheeses: nonfat or low-fat cottage cheese (1% or 2% fat), cheeses made with part skim milk, such as mozzarella, farmers, string, or ricotta. (Cheeses should be labeled no more than 2 to 6 grams fat per oz.). Skim (or 1%) milk: liquid, powdered, or evaporated. Buttermilk made with low-fat milk. Drinks made with skim or low-fat milk or cocoa. Chocolate milk or cocoa made with skim or low-fat (1%) milk. Nonfat or low-fat yogurt.  Avoid: Whole milk cheeses, including colby, cheddar, [place], [address], [place], [place], [place], [address], [place], and blue. Creamed cottage cheese, cream cheese. Whole milk and whole milk products, including buttermilk or yogurt made from whole milk, drinks made from whole milk. Condensed milk, evaporated whole milk, and 2% milk. Soups and Combination Foods  Allowed: Low-fat low-sodium soups: broth, dehydrated soups, homemade broth, soups with the fat removed, homemade cream soups made with skim or low-fat milk. Low-fat spaghetti, lasagna, chili, and Spanish rice if low-fat ingredients and low-fat cooking techniques are used.  Avoid: Cream soups made with whole milk, cream, or high-fat cheese. All other soups. Desserts and Sweets  Allowed: Sherbet, fruit ices, gelatins, meringues, and angel food cake. Homemade desserts with recommended  fats, oils, and milk products. Jam, jelly, honey, marmalade, sugars, and syrups. Pure sugar candy, such as gum drops, hard candy, jelly beans, marshmallows, mints, and small amounts of dark chocolate.  Avoid: Commercially prepared cakes, pies, cookies, frosting, pudding, or mixes for these products. Desserts containing whole milk products, chocolate, coconut, lard, palm oil, or palm kernel oil. Ice cream or ice cream drinks. Candy  that contains chocolate, coconut, butter, hydrogenated fat, or unknown ingredients. Buttered syrups. Fats and Oils  Allowed: Vegetable oils: safflower, sunflower, corn, soybean, cottonseed, sesame, canola, olive, or peanut. Non-hydrogenated margarines. Salad dressing or mayonnaise: homemade or commercial, made with a recommended oil. Low or nonfat salad dressing or mayonnaise.  Limit added fats and oils to 6 to 8 tsp per day (includes fats used in cooking, baking, salads, and spreads on bread). Remember to count the "hidden fats" in foods.  Avoid: Solid fats and shortenings: butter, lard, salt pork, bacon drippings. Gravy containing meat fat, shortening, or suet. Cocoa butter, coconut. Coconut oil, palm oil, palm kernel oil, or hydrogenated oils: these ingredients are often used in bakery products, nondairy creamers, whipped toppings, candy, and commercially fried foods. Read labels carefully. Salad dressings made of unknown oils, sour cream, or cheese, such as blue cheese and [place]. Cream, all kinds: half-and-half, light, heavy, or whipping. Sour cream or cream cheese (even if "light" or low-fat). Nondairy cream substitutes: coffee creamers and sour cream substitutes made with palm, palm kernel, hydrogenated oils, or coconut oil. Beverages  Allowed: Coffee (regular or decaffeinated), tea. Diet carbonated beverages, mineral water. Alcohol: Check with your caregiver. Moderation is recommended.  Avoid: Whole milk, regular sodas, and juice drinks with added sugar. Condiments  Allowed: All seasonings and condiments. Cocoa powder. "Cream" sauces made with recommended ingredients.  Avoid: Carob powder made with hydrogenated fats. SAMPLE MENU Breakfast   cup orange juice   cup oatmeal  1 slice toast  1 tsp margarine  1 cup skim milk Lunch  [place] sandwich with 2 oz [place], 2 slices bread  Lettuce and tomato slices  Fresh fruit  Carrot sticks  Coffee or tea Snack  Fresh fruit  or low-fat crackers Dinner  3 oz lean ground beef  1 baked potato  1 tsp margarine   cup asparagus  Lettuce salad  1 tbs non-creamy dressing   cup peach slices  1 cup skim milk Document Released: 03/07/2008 Document Revised: 11/28/2011 Document Reviewed: 08/22/2011 [facility] Patient Information 2013 [place], [place].

## 2012-09-19 NOTE — Assessment & Plan Note (Signed)
Labs were not fasting-- husband will bring her back for fasting labs in the future before next visit.

## 2012-09-19 NOTE — Assessment & Plan Note (Signed)
Behaviors have Improved now she has been treated for UTI. Will cont current medications

## 2012-09-19 NOTE — Assessment & Plan Note (Signed)
Patient is stable; continue current regimen. Will monitor and make changes as necessary.  

## 2012-09-19 NOTE — Progress Notes (Signed)
Patient ID: Charlotte Kelly, female   DOB: Jul 31, 1929, 79 y.o.   MRN: [id number] Code status: unknown -- needs to clarify with daughter   No Known Allergies  Chief Complaint  Patient presents with  . Annual Exam  . acute UTI's--wants a preventive    HPI: Patient is a 79 y.o. female seen in the office today for extended visit. Currently being treated for UTI- aid reports she has chronic UTIs and since she has bene treated it has been like night and day. Able to comprehend information better. More cooperative. Sleeping better with Seroquel. Would like to have something preventative  Taking lorazepam 0.5 mg 2-3 times daily as needed Currently without complaints Review of Systems: Review of Systems  Unable to perform ROS: dementia  MMSE score of 5 today    Past Medical History  Diagnosis Date  . Arthritis   . Depression   . Cataract   . Dementia in conditions classified elsewhere with behavioral disturbance(294.11)   . Anxiety   . Insomnia   . Insomnia, unspecified   . Other alteration of consciousness   . Hypertension    Past Surgical History  Procedure Laterality Date  . Gallblader    . Small intestine surgery     Social History:   reports that she has quit smoking. She does not have any smokeless tobacco history on file. She reports that she does not drink alcohol or use illicit drugs.  Family History  Problem Relation Age of Onset  . Heart failure Father     Medications: Patient's Medications  New Prescriptions   NITROFURANTOIN, MACROCRYSTAL-MONOHYDRATE, (MACROBID) 100 MG CAPSULE    Take 1 capsule (100 mg total) by mouth at bedtime.  Previous Medications   ASPIRIN 81 MG TABLET    Take 81 mg by mouth daily. Take one tablet once daily to prevent stroke   B COMPLEX-C (SUPER B COMPLEX PO)    Take by mouth. Take one tablet once daily   CHOLECALCIFEROL (VITAMIN D) 2000 UNITS TABLET    Take 2,000 Units by mouth daily. Take one tablet once daily for vitamin d   CIPROFLOXACIN  (CIPRO) 500 MG TABLET    Take one tablet by mouth twice daily for 7 days.   CRANBERRY 250 MG CAPS    Take by mouth. Take one tablet once daily to prevent UTI   DOCUSATE SODIUM (COLACE) 100 MG CAPSULE    Take 100 mg by mouth 2 (two) times daily. Take one tablet once daily for irregularity   DONEPEZIL (ARICEPT) 10 MG TABLET    Take one tablet by mouth daily for memory.   FISH OIL-OMEGA-3 FATTY ACIDS 1000 MG CAPSULE    Take 2 g by mouth daily.   LISINOPRIL (PRINIVIL,ZESTRIL) 10 MG TABLET    Take 1 tablet (10 mg total) by mouth daily.   LORAZEPAM (ATIVAN) 0.5 MG TABLET    Take 0.5 mg by mouth every 8 (eight) hours as needed for anxiety.   MEMANTINE (NAMENDA) 10 MG TABLET    Take 10 mg by mouth 2 (two) times daily.   MULTIVITAMIN-IRON-MINERALS-FOLIC ACID (CENTRUM) CHEWABLE TABLET    Chew 1 tablet by mouth daily. Take one tablet once daily for supplement   QUETIAPINE (SEROQUEL) 25 MG TABLET    Take one tablet by mouth at night for sleep and behaviors.   SACCHAROMYCES BOULARDII (FLORASTOR) 250 MG CAPSULE    Take  One tablet by mouth twice daily for 10 days to prevent antibiotic associated diarrhea.  SERTRALINE (ZOLOFT) 100 MG TABLET    Take 100 mg by mouth daily.   SERTRALINE (ZOLOFT) 25 MG TABLET    Take 25 mg by mouth daily. Take one tablet once daily for depression   TROSPIUM (SANCTURA) 20 MG TABLET    Take 20 mg by mouth 2 (two) times daily.   VITAMIN B-12 (CYANOCOBALAMIN) 500 MCG TABLET    Take 500 mcg by mouth daily. Take one tablet once daily for vitamin B12  Modified Medications   No medications on file  Discontinued Medications   No medications on file     Physical Exam: Physical Exam  Vitals reviewed. Constitutional: She appears well-developed and well-nourished.  HENT:  Head: Normocephalic and atraumatic.  Right Ear: External ear normal.  Left Ear: External ear normal.  Nose: Nose normal.  Mouth/Throat: Oropharynx is clear and moist. No oropharyngeal exudate.  Eyes: Conjunctivae  and EOM are normal. Pupils are equal, round, and reactive to light.  Neck: Normal range of motion. Neck supple.  Cardiovascular: Normal rate, regular rhythm, normal heart sounds and intact distal pulses.   Pulmonary/Chest: Effort normal and breath sounds normal.  Abdominal: Soft. Bowel sounds are normal. She exhibits no distension. There is no tenderness.  Genitourinary: Guaiac negative stool (also will send home stool test).  Pelvic exam declined   Musculoskeletal: Normal range of motion. She exhibits no edema and no tenderness.  Neurological: She is alert. She has normal reflexes.  Oriented to self only  Skin: Skin is warm and dry.  Psychiatric: She has a normal mood and affect.    Filed Vitals:   09/19/12 0937  BP: 144/86  Pulse: 72  Temp: 98.1 F (36.7 C)  TempSrc: Oral  Resp: 16  Height: 5' (1.524 m)  Weight: 135 lb (61.236 kg)      Labs reviewed: Basic Metabolic Panel:  Recent Labs  16/10/96 0948  NA 144  K 4.8  CL 103  CO2 25  GLUCOSE 137*  BUN 17  CREATININE 0.84  CALCIUM 9.3  TSH 3.930   Liver Function Tests:  Recent Labs  09/17/12 0948  AST 16  ALT 17  ALKPHOS 107  BILITOT 0.3  PROT 7.2   CBC:  Recent Labs  09/17/12 0948  WBC 7.0  NEUTROABS 4.6  HGB 12.9  HCT 40.3  MCV 93   Lipid Panel:  Recent Labs  09/17/12 0948  HDL 37*  LDLCALC 149*  TRIG 236*  CHOLHDL 6.3*    Assessment/Plan HTN (hypertension) Patient is stable; continue current regimen. Will monitor and make changes as necessary.   Dementia in conditions classified elsewhere with behavioral disturbance(294.11) Behaviors have Improved now she has been treated for UTI. Will cont current medications  UTI (urinary tract infection) Cont cipro until treatments complete. Then will start macrodantin 100mg  hs and increase hydration to prevent future UTI   Anxiety Patient is stable; continue current regimen. Will monitor and make changes as  necessary.   Insomnia Improved now that she is on Seroquel   Other and unspecified hyperlipidemia Labs were not fasting-- husband will bring her back for fasting labs in the future before next visit.

## 2012-09-26 ENCOUNTER — Other Ambulatory Visit: Payer: Self-pay | Admitting: Nurse Practitioner

## 2012-09-26 DIAGNOSIS — Z1211 Encounter for screening for malignant neoplasm of colon: Secondary | ICD-10-CM | POA: Diagnosis not present

## 2012-09-26 DIAGNOSIS — R195 Other fecal abnormalities: Secondary | ICD-10-CM | POA: Diagnosis not present

## 2012-09-26 MED ORDER — DIVALPROEX SODIUM 125 MG PO CPSP: 250.0000 mg | ORAL_CAPSULE | Freq: Three times a day (TID) | ORAL | Status: DC

## 2012-09-26 MED ORDER — VALPROIC ACID 250 MG PO CAPS: 250.0000 mg | ORAL_CAPSULE | Freq: Three times a day (TID) | ORAL | Status: DC

## 2012-09-26 NOTE — Progress Notes (Signed)
pts caregiver comes to the clinic to drop off stool sample and reports Charlotte Kelly has completed cipro and is now more alert but also more combative and agitated. Going into places that she is not supposed to go ie peoples apartments and is not able to be redirected. Dr Bufford Spikes recommended Depakote sprinkles and they would like to try treatment with this at this time. Will give Rx and follow up in clinic in 4 weeks for follow up and labs

## 2012-09-30 ENCOUNTER — Encounter: Payer: Self-pay | Admitting: *Deleted

## 2012-10-01 ENCOUNTER — Other Ambulatory Visit: Payer: Medicare Other

## 2012-10-01 DIAGNOSIS — E785 Hyperlipidemia, unspecified: Secondary | ICD-10-CM | POA: Diagnosis not present

## 2012-10-02 LAB — LIPID PANEL
Chol/HDL Ratio: 4.8 ratio units — ABNORMAL HIGH (ref 0.0–4.4)
LDL Calculated: 115 mg/dL — ABNORMAL HIGH (ref 0–99)

## 2012-10-23 ENCOUNTER — Other Ambulatory Visit: Payer: Self-pay | Admitting: *Deleted

## 2012-10-23 MED ORDER — LORAZEPAM 0.5 MG PO TABS: ORAL_TABLET | ORAL | Status: DC

## 2012-10-28 ENCOUNTER — Other Ambulatory Visit: Payer: Self-pay | Admitting: Nurse Practitioner

## 2012-10-28 ENCOUNTER — Ambulatory Visit: Payer: Medicare Other | Admitting: Nurse Practitioner

## 2012-11-06 ENCOUNTER — Encounter: Payer: Self-pay | Admitting: Nurse Practitioner

## 2012-11-06 ENCOUNTER — Ambulatory Visit (INDEPENDENT_AMBULATORY_CARE_PROVIDER_SITE_OTHER): Payer: Medicare Other | Admitting: Nurse Practitioner

## 2012-11-06 VITALS — BP 120/64 | HR 57 | Temp 98.1°F | Resp 14 | Ht 60.0 in | Wt 128.0 lb

## 2012-11-06 DIAGNOSIS — F02818 Dementia in other diseases classified elsewhere, unspecified severity, with other behavioral disturbance: Secondary | ICD-10-CM

## 2012-11-06 DIAGNOSIS — G4700 Insomnia, unspecified: Secondary | ICD-10-CM

## 2012-11-06 DIAGNOSIS — F411 Generalized anxiety disorder: Secondary | ICD-10-CM

## 2012-11-06 DIAGNOSIS — F0281 Dementia in other diseases classified elsewhere with behavioral disturbance: Secondary | ICD-10-CM

## 2012-11-06 DIAGNOSIS — I10 Essential (primary) hypertension: Secondary | ICD-10-CM | POA: Diagnosis not present

## 2012-11-06 DIAGNOSIS — F419 Anxiety disorder, unspecified: Secondary | ICD-10-CM

## 2012-11-06 MED ORDER — QUETIAPINE FUMARATE 25 MG PO TABS: ORAL_TABLET | ORAL | Status: DC

## 2012-11-06 MED ORDER — DIVALPROEX SODIUM 125 MG PO CPSP: 250.0000 mg | ORAL_CAPSULE | Freq: Three times a day (TID) | ORAL | Status: DC

## 2012-11-06 NOTE — Assessment & Plan Note (Signed)
Behaviors have worsen- see anxiety for detail on changes in medications

## 2012-11-06 NOTE — Progress Notes (Signed)
Patient ID: Charlotte Kelly, female   DOB: [birth year], 79 y.o.   MRN: [id number]   No Known Allergies  Chief Complaint  Patient presents with  . Follow up  medication    HPI: Patient is a 79 y.o. female seen in the office today for medication follow up. Reports behaviors have been worse. When Depakote was originally started things were going good but now the medication is only lasting for a short period of time and she is having outburst in between doses. Behaviors include taking off her closes in the facility lobby, throwing food and drinks at other people, being combative towards caregivers. Caregiver and pts husband report there is always an 11 am outburst. Pt has also been seeing Erick Blinks MD who is a geriatric physician who does home visit Originally when she was taking her medication she was too tired so [facility] decreased it to 375 mg twice daily per caregiver. She is taking Depakote when she wakes up and then again around 1 and she takes ativan scheduled. She is also taking 1 1/2 tablet seroquel at night which keeps her asleep all night.   Review of Systems:  Review of Systems  Constitutional: Negative for malaise/fatigue.  Respiratory: Negative for cough and shortness of breath.   Cardiovascular: Negative for chest pain and leg swelling.  Gastrointestinal: Negative for abdominal pain, diarrhea and constipation.  Genitourinary: Negative for dysuria and urgency.  Musculoskeletal: Negative for myalgias.  Neurological: Negative for weakness.  Psychiatric/Behavioral: Positive for memory loss. Negative for hallucinations. The patient is nervous/anxious. The patient does not have insomnia.      Past Medical History  Diagnosis Date  . Arthritis   . Depression   . Cataract   . Dementia in conditions classified elsewhere with behavioral disturbance(294.11)   . Anxiety   . Insomnia   . Insomnia, unspecified   . Other alteration of consciousness   . Hypertension    Past Surgical  History  Procedure Laterality Date  . Gallblader    . Small intestine surgery     Social History:   reports that she has quit smoking. She does not have any smokeless tobacco history on file. She reports that she does not drink alcohol or use illicit drugs.  Family History  Problem Relation Age of Onset  . Heart failure Father     Medications: Patient's Medications  New Prescriptions   DIVALPROEX (DEPAKOTE SPRINKLES) 125 MG CAPSULE    Take 2 capsules (250 mg total) by mouth 3 (three) times daily.   QUETIAPINE (SEROQUEL) 25 MG TABLET    1/2 tablet mid morning and 1 and 1/2 at bedtime for behaviors and sleep  Previous Medications   ASPIRIN 81 MG TABLET    Take 81 mg by mouth daily. Take one tablet once daily to prevent stroke   B COMPLEX-C (SUPER B COMPLEX PO)    Take by mouth. Take one tablet once daily   CHOLECALCIFEROL (VITAMIN D) 2000 UNITS TABLET    Take 2,000 Units by mouth daily. Take one tablet once daily for vitamin d   CRANBERRY 250 MG CAPS    Take by mouth. Take one tablet once daily to prevent UTI   DOCUSATE SODIUM (COLACE) 100 MG CAPSULE    Take 100 mg by mouth 2 (two) times daily. Take one tablet once daily for irregularity   DONEPEZIL (ARICEPT) 10 MG TABLET    Take one tablet by mouth daily for memory.   FISH OIL-OMEGA-3 FATTY ACIDS 1000 MG CAPSULE  Take 2 g by mouth daily.   LISINOPRIL (PRINIVIL,ZESTRIL) 10 MG TABLET    Take 1 tablet (10 mg total) by mouth daily.   MEMANTINE (NAMENDA) 10 MG TABLET    Take 10 mg by mouth 2 (two) times daily.   MULTIVITAMIN-IRON-MINERALS-FOLIC ACID (CENTRUM) CHEWABLE TABLET    Chew 1 tablet by mouth daily. Take one tablet once daily for supplement   NITROFURANTOIN, MACROCRYSTAL-MONOHYDRATE, (MACROBID) 100 MG CAPSULE    Take 1 capsule (100 mg total) by mouth at bedtime.   SACCHAROMYCES BOULARDII (FLORASTOR) 250 MG CAPSULE    Take  One tablet by mouth twice daily for 10 days to prevent antibiotic associated diarrhea.   SERTRALINE (ZOLOFT)  100 MG TABLET    Take 100 mg by mouth daily.   SERTRALINE (ZOLOFT) 25 MG TABLET    TAKE 1 TABLET BY MOUTH DAILY WITH 100MG  TABLET   TROSPIUM (SANCTURA) 20 MG TABLET    Take 20 mg by mouth 2 (two) times daily.   VITAMIN B-12 (CYANOCOBALAMIN) 500 MCG TABLET    Take 500 mcg by mouth daily. Take one tablet once daily for vitamin B12  Modified Medications   Modified Medication Previous Medication   LORAZEPAM (ATIVAN) 0.5 MG TABLET LORazepam (ATIVAN) 0.5 MG tablet      Take 0.5 mg by mouth every 8 (eight) hours as needed. Take one tablet every 8 hours as needed for anxiety.    Take one tablet every 8 hours as needed for anxiety.  Discontinued Medications   CIPROFLOXACIN (CIPRO) 500 MG TABLET    Take one tablet by mouth twice daily for 7 days.   QUETIAPINE (SEROQUEL) 25 MG TABLET    Take one tablet by mouth at night for sleep and behaviors.   QUETIAPINE (SEROQUEL) 25 MG TABLET    Take 25 mg by mouth at bedtime. Take one and one half tablet by mouth at night for sleep and behaviors.   VALPROIC ACID (DEPAKENE) 250 MG CAPSULE    Take 1 capsule (250 mg total) by mouth 3 (three) times daily.   VALPROIC ACID (DEPAKENE) 250 MG CAPSULE    250 mg. Take 1 capsule three times daily     Physical Exam:  Filed Vitals:   11/06/12 1302  BP: 120/64  Pulse: 57  Temp: 98.1 F (36.7 C)  TempSrc: Oral  Resp: 14  Height: 5' (1.524 m)  Weight: 128 lb (58.06 kg)  SpO2: 98%   Physical Exam  Nursing note and vitals reviewed. Constitutional: She appears well-developed and well-nourished. No distress.  Eyes: EOM are normal. Pupils are equal, round, and reactive to light.  Neck: Normal range of motion. Neck supple.  Cardiovascular: Normal rate, regular rhythm and normal heart sounds.   Pulmonary/Chest: Effort normal and breath sounds normal.  Abdominal: Soft. Bowel sounds are normal. She exhibits no distension. There is no tenderness.  Musculoskeletal: Normal range of motion. She exhibits no edema and no  tenderness.  Neurological: She is alert.  Skin: Skin is warm and dry. She is not diaphoretic. No erythema. No pallor.  Psychiatric: She is hyperactive. She exhibits abnormal recent memory and abnormal remote memory.    Labs reviewed: Basic Metabolic Panel:  Recent Labs  21/30/86 0948  NA 144  K 4.8  CL 103  CO2 25  GLUCOSE 137*  BUN 17  CREATININE 0.84  CALCIUM 9.3  TSH 3.930   Liver Function Tests:  Recent Labs  09/17/12 0948  AST 16  ALT 17  ALKPHOS 107  BILITOT 0.3  PROT 7.2   No results found for this basename: LIPASE, AMYLASE,  in the last 8760 hours No results found for this basename: AMMONIA,  in the last 8760 hours CBC:  Recent Labs  09/17/12 0948  WBC 7.0  NEUTROABS 4.6  HGB 12.9  HCT 40.3  MCV 93   Lipid Panel:  Recent Labs  09/17/12 0948 10/01/12 0959  HDL 37* 40  LDLCALC 149* 115*  TRIG 236* 175*  CHOLHDL 6.3* 4.8*    Assessment/Plan HTN (hypertension) Patient is stable; continue current regimen. Will monitor and make changes as necessary.   Insomnia Stable on current medications- will cont seroquel 37.5 total at night for sleep   Anxiety Worse during the day- will change medications to 1/2 tablet Seroquel at 1030 am and to use Depakote sprinkles 250 mg three times daily as ordinally prescribed.   Dementia in conditions classified elsewhere with behavioral disturbance(294.11) Behaviors have worsen- see anxiety for detail on changes in medications    To follow up in 1 week with Dr Renato Gails. If needed to call our office regarding questions or concerns regarding medications.   Labs/tests ordered  Will get Depakote level, cbc with diff, cmp

## 2012-11-06 NOTE — Assessment & Plan Note (Signed)
Stable on current medications- will cont seroquel 37.5 total at night for sleep

## 2012-11-06 NOTE — Patient Instructions (Signed)
Please follow up in 1 week with Dr Renato Gails  To start taking Depakote sprinkles 250 mg three times daily approximant times are 9 am, 1 pm, and 4:30 pm  To take 1/2 tablet of Seroquel mid-morning (10:30 am)  to prevent 11 am episodes  Cont 1 1/2 tabs of Seroquel for sleep

## 2012-11-06 NOTE — Assessment & Plan Note (Signed)
Worse during the day- will change medications to 1/2 tablet Seroquel at 1030 am and to use Depakote sprinkles 250 mg three times daily as ordinally prescribed.

## 2012-11-06 NOTE — Assessment & Plan Note (Signed)
Patient is stable; continue current regimen. Will monitor and make changes as necessary.  

## 2012-11-07 LAB — COMPREHENSIVE METABOLIC PANEL
Albumin: 3.8 g/dL (ref 3.5–4.7)
Alkaline Phosphatase: 92 IU/L (ref 39–117)
BUN/Creatinine Ratio: 43 — ABNORMAL HIGH (ref 11–26)
BUN: 22 mg/dL (ref 8–27)
CO2: 20 mmol/L (ref 19–28)
Calcium: 9 mg/dL (ref 8.6–10.2)
Chloride: 104 mmol/L (ref 97–108)
Creatinine, Ser: 0.51 mg/dL — ABNORMAL LOW (ref 0.57–1.00)
Globulin, Total: 3 g/dL (ref 1.5–4.5)
Glucose: 147 mg/dL — ABNORMAL HIGH (ref 65–99)

## 2012-11-07 LAB — VALPROIC ACID LEVEL: Valproic Acid Lvl: 94 ug/mL (ref 50–100)

## 2012-11-07 LAB — CBC WITH DIFFERENTIAL/PLATELET
Basophils Absolute: 0 10*3/uL (ref 0.0–0.2)
Eos: 2 % (ref 0–5)
HCT: 36.1 % (ref 34.0–46.6)
Immature Grans (Abs): 0 10*3/uL (ref 0.0–0.1)
Immature Granulocytes: 0 % (ref 0–2)
Lymphs: 27 % (ref 14–46)
MCHC: 33 g/dL (ref 31.5–35.7)
Monocytes: 9 % (ref 4–12)
Neutrophils Relative %: 61 % (ref 40–74)
RDW: 14 % (ref 12.3–15.4)
WBC: 8.2 10*3/uL (ref 3.4–10.8)

## 2012-11-12 ENCOUNTER — Emergency Department (HOSPITAL_COMMUNITY): Payer: Medicare Other

## 2012-11-12 ENCOUNTER — Encounter (HOSPITAL_COMMUNITY): Payer: Self-pay | Admitting: *Deleted

## 2012-11-12 ENCOUNTER — Emergency Department (HOSPITAL_COMMUNITY)
Admission: EM | Admit: 2012-11-12 | Discharge: 2012-11-12 | Disposition: A | Discharge status: Home / Self Care | Payer: Medicare Other | Attending: Emergency Medicine | Admitting: Emergency Medicine

## 2012-11-12 DIAGNOSIS — I10 Essential (primary) hypertension: Secondary | ICD-10-CM | POA: Diagnosis not present

## 2012-11-12 DIAGNOSIS — S79919A Unspecified injury of unspecified hip, initial encounter: Secondary | ICD-10-CM | POA: Insufficient documentation

## 2012-11-12 DIAGNOSIS — S0993XA Unspecified injury of face, initial encounter: Secondary | ICD-10-CM | POA: Diagnosis not present

## 2012-11-12 DIAGNOSIS — Z79899 Other long term (current) drug therapy: Secondary | ICD-10-CM | POA: Insufficient documentation

## 2012-11-12 DIAGNOSIS — M5020 Other cervical disc displacement, unspecified cervical region: Secondary | ICD-10-CM | POA: Diagnosis not present

## 2012-11-12 DIAGNOSIS — R4182 Altered mental status, unspecified: Secondary | ICD-10-CM | POA: Diagnosis not present

## 2012-11-12 DIAGNOSIS — IMO0002 Reserved for concepts with insufficient information to code with codable children: Secondary | ICD-10-CM | POA: Insufficient documentation

## 2012-11-12 DIAGNOSIS — R9431 Abnormal electrocardiogram [ECG] [EKG]: Secondary | ICD-10-CM | POA: Diagnosis not present

## 2012-11-12 DIAGNOSIS — M545 Low back pain: Secondary | ICD-10-CM | POA: Diagnosis not present

## 2012-11-12 DIAGNOSIS — M549 Dorsalgia, unspecified: Secondary | ICD-10-CM

## 2012-11-12 DIAGNOSIS — Z87891 Personal history of nicotine dependence: Secondary | ICD-10-CM | POA: Diagnosis not present

## 2012-11-12 DIAGNOSIS — Y92009 Unspecified place in unspecified non-institutional (private) residence as the place of occurrence of the external cause: Secondary | ICD-10-CM | POA: Insufficient documentation

## 2012-11-12 DIAGNOSIS — F0390 Unspecified dementia without behavioral disturbance: Secondary | ICD-10-CM | POA: Diagnosis not present

## 2012-11-12 DIAGNOSIS — M542 Cervicalgia: Secondary | ICD-10-CM | POA: Diagnosis not present

## 2012-11-12 DIAGNOSIS — Y939 Activity, unspecified: Secondary | ICD-10-CM | POA: Insufficient documentation

## 2012-11-12 DIAGNOSIS — M129 Arthropathy, unspecified: Secondary | ICD-10-CM | POA: Diagnosis not present

## 2012-11-12 DIAGNOSIS — H269 Unspecified cataract: Secondary | ICD-10-CM | POA: Diagnosis not present

## 2012-11-12 DIAGNOSIS — S79929A Unspecified injury of unspecified thigh, initial encounter: Secondary | ICD-10-CM | POA: Diagnosis not present

## 2012-11-12 DIAGNOSIS — Z7982 Long term (current) use of aspirin: Secondary | ICD-10-CM | POA: Diagnosis not present

## 2012-11-12 DIAGNOSIS — W19XXXA Unspecified fall, initial encounter: Secondary | ICD-10-CM

## 2012-11-12 DIAGNOSIS — G4700 Insomnia, unspecified: Secondary | ICD-10-CM | POA: Diagnosis not present

## 2012-11-12 DIAGNOSIS — F3289 Other specified depressive episodes: Secondary | ICD-10-CM | POA: Insufficient documentation

## 2012-11-12 DIAGNOSIS — M25559 Pain in unspecified hip: Secondary | ICD-10-CM | POA: Diagnosis not present

## 2012-11-12 DIAGNOSIS — R079 Chest pain, unspecified: Secondary | ICD-10-CM | POA: Diagnosis not present

## 2012-11-12 DIAGNOSIS — M25551 Pain in right hip: Secondary | ICD-10-CM

## 2012-11-12 DIAGNOSIS — F411 Generalized anxiety disorder: Secondary | ICD-10-CM | POA: Diagnosis not present

## 2012-11-12 DIAGNOSIS — F329 Major depressive disorder, single episode, unspecified: Secondary | ICD-10-CM | POA: Insufficient documentation

## 2012-11-12 DIAGNOSIS — S199XXA Unspecified injury of neck, initial encounter: Secondary | ICD-10-CM | POA: Insufficient documentation

## 2012-11-12 NOTE — ED Notes (Signed)
PT 's care giver reports Py C/O right side pain and neck pain after fall this morning.

## 2012-11-12 NOTE — ED Notes (Signed)
Pt walked upon d/c in [place]. Dr Bebe Shaggy endorses that pt stable for d/c upon being able to walk. Pt ambulatory upon d/c. Pt caregiver and pt husband given d/c teaching. No further questions upon d/c. NAD noted upon d/c. Pt leaving with caregiver and husband.

## 2012-11-12 NOTE — ED Provider Notes (Signed)
History     CSN: [id number]  Arrival date & time 11/12/12  1056   First MD Initiated Contact with Patient 11/12/12 1117      Chief Complaint  Patient presents with  . Fall  . Hip Pain     Patient is a 79 y.o. female presenting with fall and hip pain. The history is provided by the patient. The history is limited by the condition of the patient.  Fall This is a new problem. Episode onset: just prior to arrival. The problem has been gradually improving. Pertinent negatives include no chest pain. Nothing aggravates the symptoms. Nothing relieves the symptoms.  Hip Pain This is a new problem. Episode onset: just prior to arrival. The problem occurs constantly. The problem has not changed since onset.Pertinent negatives include no chest pain. Nothing aggravates the symptoms. Nothing relieves the symptoms.  pt presents from home s/p fall She lives at home with husband Just prior to arrival of in home caregiver, pt was found in carpeted floor of bedroom.  She was lying on her back.  Husband reports she found her and she was awake/alert and at her baseline and no distress.  Upon standing she reported right hip pain.  She was able to ambulate to the other room Pt has h/o dementia and is poor historian at baseline but she does have balance issues and he feels she likely tripped and fell  Pt also reports neck pain.  It is unclear if she hit her head She also points to low back as area of pain as well She is not on anticoagulants  Past Medical History  Diagnosis Date  . Arthritis   . Depression   . Cataract   . Dementia in conditions classified elsewhere with behavioral disturbance(294.11)   . Anxiety   . Insomnia   . Insomnia, unspecified   . Other alteration of consciousness   . Hypertension     Past Surgical History  Procedure Laterality Date  . Gallblader    . Small intestine surgery    . Cholecystectomy      Family History  Problem Relation Age of Onset  . Heart failure  Father     History  Substance Use Topics  . Smoking status: Former Games developer  . Smokeless tobacco: Not on file  . Alcohol Use: No    OB History   Grav Para Term Preterm Abortions TAB SAB Ect Mult Living                  Review of Systems  Unable to perform ROS: Dementia  Cardiovascular: Negative for chest pain.    Allergies  Review of patient's allergies indicates no known allergies.  Home Medications   Current Outpatient Rx  Name  Route  Sig  Dispense  Refill  . aspirin 81 MG tablet   Oral   Take 81 mg by mouth daily. Take one tablet once daily to prevent stroke         . B Complex-C (SUPER B COMPLEX PO)   Oral   Take by mouth. Take one tablet once daily         . Cholecalciferol (VITAMIN D) 2000 UNITS tablet   Oral   Take 2,000 Units by mouth daily. Take one tablet once daily for vitamin d         . Cranberry 250 MG CAPS   Oral   Take by mouth. Take one tablet once daily to prevent UTI         .  divalproex (DEPAKOTE SPRINKLES) 125 MG capsule   Oral   Take 2 capsules (250 mg total) by mouth 3 (three) times daily.   90 capsule   0   . docusate sodium (COLACE) 100 MG capsule   Oral   Take 100 mg by mouth 2 (two) times daily. Take one tablet once daily for irregularity         . donepezil (ARICEPT) 10 MG tablet      Take one tablet by mouth daily for memory.   30 tablet   3   . fish oil-omega-3 fatty acids 1000 MG capsule   Oral   Take 2 g by mouth daily.         Marland Kitchen lisinopril (PRINIVIL,ZESTRIL) 10 MG tablet   Oral   Take 1 tablet (10 mg total) by mouth daily.   90 tablet   3   . LORazepam (ATIVAN) 0.5 MG tablet   Oral   Take 0.5 mg by mouth every 8 (eight) hours as needed. Take one tablet every 8 hours as needed for anxiety.         . memantine (NAMENDA) 10 MG tablet   Oral   Take 10 mg by mouth 2 (two) times daily.         . multivitamin-iron-minerals-folic acid (CENTRUM) chewable tablet   Oral   Chew 1 tablet by mouth daily.  Take one tablet once daily for supplement         . nitrofurantoin, macrocrystal-monohydrate, (MACROBID) 100 MG capsule   Oral   Take 1 capsule (100 mg total) by mouth at bedtime.   30 capsule   4   . QUEtiapine (SEROQUEL) 25 MG tablet      1/2 tablet mid morning and 1 and 1/2 at bedtime for behaviors and sleep   60 tablet   3   . saccharomyces boulardii (FLORASTOR) 250 MG capsule      Take  One tablet by mouth twice daily for 10 days to prevent antibiotic associated diarrhea.   20 capsule   0   . sertraline (ZOLOFT) 100 MG tablet   Oral   Take 100 mg by mouth daily.         . sertraline (ZOLOFT) 25 MG tablet      TAKE 1 TABLET BY MOUTH DAILY WITH 100MG  TABLET   90 tablet   0   . trospium (SANCTURA) 20 MG tablet   Oral   Take 20 mg by mouth 2 (two) times daily.         . vitamin B-12 (CYANOCOBALAMIN) 500 MCG tablet   Oral   Take 500 mcg by mouth daily. Take one tablet once daily for vitamin B12           BP 146/97  Pulse 81  Temp(Src) 98.4 F (36.9 C) (Oral)  Resp 20  SpO2 98%  Physical Exam CONSTITUTIONAL: Well developed/well nourished HEAD: Normocephalic/atraumatic EYES: EOMI ENMT: Mucous membranes moist, No evidence of facial/nasal trauma NECK: supple no meningeal signs SPINE:No bruising/crepitance/stepoffs noted to spine She points to neck and low back as areas of pain CV: S1/S2 noted, no murmurs/rubs/gallops noted LUNGS: Lungs are clear to auscultation bilaterally, no apparent distress ABDOMEN: soft, nontender, no rebound or guarding GU:no cva tenderness NEURO: Pt is awake/alert, moves all extremitiesx4, pleasantly demented, no distress EXTREMITIES: pulses normal, full ROM.  Mild tenderness with ROM of right hip No deformity All other extremities/joints palpated/ranged and nontender SKIN: warm, color normal, no bruising noted   ED  Course  Procedures  12:10 PM Pt presents s/p fall at home It is unclear if she hit her head and she is  poor historian at baseline.  She did report neck pain and I can not reliably clinically clear her cspine due to dementia.  Also unclear if she has head injury, therefore CT head/cspine ordered She also reported low back pain - lumbar spine imaged Also reported right hip pain, right hip will be imaged  She had otherwise been at her baseline, no vomiting reported.  Will defer lab evaluation as I suspect this was mechanical fall  1:03 PM Pt improved Ambulatory at her baseline without any issue Stable for d/c with husband and caregiver MDM  Nursing notes including past medical history and social history reviewed and considered in documentation xrays reviewed and considered      Date: 11/12/2012  Rate: 82  Rhythm: normal sinus rhythm  QRS Axis: left  Intervals: normal  ST/T Wave abnormalities: nonspecific ST changes  Conduction Disutrbances:none PVCs noted      Joya Gaskins, MD 11/12/12 1304

## 2012-11-12 NOTE — ED Notes (Signed)
Pt had an unwitnessed fall this am and [facility] stated patients color did not look good this morning but her vitals were fine.  Pt has dementia.  Pt was complaining of right hip pain

## 2012-11-12 NOTE — ED Notes (Signed)
PT ambulated with assist tol well no pain .

## 2012-11-12 NOTE — ED Notes (Signed)
Pt states neck hurts too

## 2012-11-14 ENCOUNTER — Ambulatory Visit (INDEPENDENT_AMBULATORY_CARE_PROVIDER_SITE_OTHER): Payer: Medicare Other | Admitting: Internal Medicine

## 2012-11-14 ENCOUNTER — Encounter: Payer: Self-pay | Admitting: Internal Medicine

## 2012-11-14 VITALS — BP 120/72 | HR 70 | Temp 98.0°F | Resp 18 | Ht 60.0 in | Wt 125.0 lb

## 2012-11-14 DIAGNOSIS — F0280 Dementia in other diseases classified elsewhere without behavioral disturbance: Secondary | ICD-10-CM

## 2012-11-14 DIAGNOSIS — F0281 Dementia in other diseases classified elsewhere with behavioral disturbance: Secondary | ICD-10-CM | POA: Diagnosis not present

## 2012-11-14 DIAGNOSIS — F410 Panic disorder [episodic paroxysmal anxiety] without agoraphobia: Secondary | ICD-10-CM

## 2012-11-14 DIAGNOSIS — F02818 Dementia in other diseases classified elsewhere, unspecified severity, with other behavioral disturbance: Secondary | ICD-10-CM

## 2012-11-14 DIAGNOSIS — G309 Alzheimer's disease, unspecified: Secondary | ICD-10-CM

## 2012-11-14 NOTE — Progress Notes (Signed)
Patient ID: Charlotte Kelly, female   DOB: 06-09-1930, 79 y.o.   MRN: [id number] Code Status: DNR   No Known Allergies  Chief Complaint  Patient presents with  . medication follow up    review med changes made last week - increased anxiety, confusion, and tremors  . fall    Monday 11/11/12 - went to ER    HPI: Patient is a 79 y.o. white female seen in the office today for f/u from ED visit and to review meds.  depakote 250mg  tid seroquel 25mg  1.5 at bedtime and 1/2 at 1030am ativan prn  No positive change with the medication changes Is more confused and a lot more anxious--not agitated, but so anxious she turns gray.  Is breathing hard, heart beating fast.   Has dry mouth--eats hard candy.   Has left-sided shaking.   Fell Monday at foot of bed.  C/o neck and back pain.  Went to [facility]--had CT head and neck, xrays of hip and neck fine.    Review of Systems:  Review of Systems  Constitutional: Positive for weight loss. Negative for fever.  HENT: Negative for hearing loss.   Eyes: Negative for blurred vision.  Respiratory: Negative for shortness of breath.   Cardiovascular: Negative for chest pain.  Gastrointestinal: Negative for abdominal pain.  Genitourinary: Negative for dysuria.  Musculoskeletal: Positive for falls.  Skin: Negative for rash.  Neurological: Positive for dizziness and tremors. Negative for seizures, loss of consciousness and headaches.  Psychiatric/Behavioral: Positive for memory loss. The patient is nervous/anxious.     Past Medical History  Diagnosis Date  . Arthritis   . Depression   . Cataract   . Dementia in conditions classified elsewhere with behavioral disturbance(294.11)   . Anxiety   . Insomnia   . Insomnia, unspecified   . Other alteration of consciousness   . Hypertension    Past Surgical History  Procedure Laterality Date  . Gallblader    . Small intestine surgery    . Cholecystectomy     Social History:   reports that she has quit  smoking. She does not have any smokeless tobacco history on file. She reports that she does not drink alcohol or use illicit drugs.  Family History  Problem Relation Age of Onset  . Heart failure Father     Medications: Patient's Medications  New Prescriptions   No medications on file  Previous Medications   ASPIRIN 81 MG TABLET    Take 81 mg by mouth daily. Take one tablet once daily to prevent stroke   B COMPLEX-C (SUPER B COMPLEX PO)    Take by mouth. Take one tablet once daily   CHOLECALCIFEROL (VITAMIN D) 2000 UNITS TABLET    Take 2,000 Units by mouth daily. Take one tablet once daily for vitamin d   CRANBERRY 250 MG CAPS    Take by mouth. Take one tablet once daily to prevent UTI   DIVALPROEX (DEPAKOTE SPRINKLES) 125 MG CAPSULE    Take 2 capsules (250 mg total) by mouth 3 (three) times daily.   DOCUSATE SODIUM (COLACE) 100 MG CAPSULE    Take 100 mg by mouth daily. Take one tablet once daily for irregularity   DONEPEZIL (ARICEPT) 10 MG TABLET    Take one tablet by mouth daily for memory.   FISH OIL-OMEGA-3 FATTY ACIDS 1000 MG CAPSULE    Take 2 g by mouth daily.   LISINOPRIL (PRINIVIL,ZESTRIL) 10 MG TABLET    Take 1 tablet (10 mg  total) by mouth daily.   LORAZEPAM (ATIVAN) 0.5 MG TABLET    Take 0.5 mg by mouth every 8 (eight) hours as needed. Take one tablet every 8 hours as needed for anxiety.   MEMANTINE (NAMENDA) 10 MG TABLET    Take 10 mg by mouth 2 (two) times daily.   MULTIVITAMIN-IRON-MINERALS-FOLIC ACID (CENTRUM) CHEWABLE TABLET    Chew 1 tablet by mouth daily. Take one tablet once daily for supplement   NITROFURANTOIN, MACROCRYSTAL-MONOHYDRATE, (MACROBID) 100 MG CAPSULE    Take 1 capsule (100 mg total) by mouth at bedtime.   QUETIAPINE (SEROQUEL) 25 MG TABLET    1/2 tablet mid morning and 1 and 1/2 at bedtime for behaviors and sleep   SERTRALINE (ZOLOFT) 100 MG TABLET    Take 150 mg by mouth daily.    TROSPIUM (SANCTURA) 20 MG TABLET    Take 20 mg by mouth 2 (two) times daily.    VITAMIN B-12 (CYANOCOBALAMIN) 500 MCG TABLET    Take 500 mcg by mouth daily. Take one tablet once daily for vitamin B12  Modified Medications   No medications on file  Discontinued Medications   No medications on file     Physical Exam: Filed Vitals:   11/14/12 1524  BP: 120/72  Pulse: 70  Temp: 98 F (36.7 C)  TempSrc: Oral  Resp: 18  Height: 5' (1.524 m)  Weight: 125 lb (56.7 kg)  SpO2: 97%  Physical Exam  Constitutional:  Frail white female, confused, pleasant, speech illogical  HENT:  Head: Normocephalic and atraumatic.  Cardiovascular: Normal rate, regular rhythm, normal heart sounds and intact distal pulses.   Pulmonary/Chest: Effort normal and breath sounds normal. No respiratory distress.  Abdominal: Soft. Bowel sounds are normal. She exhibits no distension. There is no tenderness.  Musculoskeletal: Normal range of motion. She exhibits no edema and no tenderness.  Stooped posture/kyphosis, shuffling gait  Skin: Skin is warm and dry.  Bruise over eye   Labs reviewed: Basic Metabolic Panel:  Recent Labs  16/10/96 0948 11/06/12 1425  NA 144 144  K 4.8 4.1  CL 103 104  CO2 25 20  GLUCOSE 137* 147*  BUN 17 22  CREATININE 0.84 0.51*  CALCIUM 9.3 9.0  TSH 3.930  --    Liver Function Tests:  Recent Labs  09/17/12 0948 11/06/12 1425  AST 16 13  ALT 17 12  ALKPHOS 107 92  BILITOT 0.3 0.2  PROT 7.2 6.8  CBC:  Recent Labs  09/17/12 0948 11/06/12 1425  WBC 7.0 8.2  NEUTROABS 4.6 5.0  HGB 12.9 11.9  HCT 40.3 36.1  MCV 93 90   Lipid Panel:  Recent Labs  09/17/12 0948 10/01/12 0959  HDL 37* 40  LDLCALC 149* 115*  TRIG 236* 175*  CHOLHDL 6.3* 4.8*   Assessment/Plan 1. Panic attacks -cont ativan for these  2. Dementia in conditions classified elsewhere with behavioral disturbance(294.11) -declining gait, more falls, question whether seroquel should be continued with increased tremor and falls  3. Alzheimer's disease -progressive--is  moderate to severe with worsening aphasia, weight loss, overlapping anxiety with panic attacks - Valproic Acid Level; Future - Basic metabolic panel; Future  Labs/tests ordered  Bmp, depakote level

## 2012-11-20 ENCOUNTER — Encounter: Payer: Self-pay | Admitting: *Deleted

## 2012-12-02 ENCOUNTER — Other Ambulatory Visit: Payer: Self-pay | Admitting: *Deleted

## 2012-12-02 ENCOUNTER — Other Ambulatory Visit: Payer: Medicare Other

## 2012-12-02 DIAGNOSIS — N390 Urinary tract infection, site not specified: Secondary | ICD-10-CM

## 2012-12-03 LAB — URINALYSIS
Bilirubin, UA: NEGATIVE
Glucose, UA: NEGATIVE
Ketones, UA: NEGATIVE
Nitrite, UA: NEGATIVE
Protein, UA: NEGATIVE
RBC, UA: NEGATIVE
Specific Gravity, UA: 1.017 (ref 1.005–1.030)
Urobilinogen, Ur: 0.2 mg/dL (ref 0.0–1.9)
pH, UA: 6.5 (ref 5.0–7.5)

## 2012-12-03 LAB — URINE CULTURE: Organism ID, Bacteria: NO GROWTH

## 2012-12-08 ENCOUNTER — Other Ambulatory Visit: Payer: Self-pay | Admitting: Nurse Practitioner

## 2012-12-11 ENCOUNTER — Other Ambulatory Visit: Payer: Medicare Other

## 2012-12-11 DIAGNOSIS — F0281 Dementia in other diseases classified elsewhere with behavioral disturbance: Secondary | ICD-10-CM | POA: Diagnosis not present

## 2012-12-11 DIAGNOSIS — F410 Panic disorder [episodic paroxysmal anxiety] without agoraphobia: Secondary | ICD-10-CM

## 2012-12-11 DIAGNOSIS — F0280 Dementia in other diseases classified elsewhere without behavioral disturbance: Secondary | ICD-10-CM

## 2012-12-11 DIAGNOSIS — G309 Alzheimer's disease, unspecified: Secondary | ICD-10-CM | POA: Diagnosis not present

## 2012-12-12 ENCOUNTER — Other Ambulatory Visit: Payer: Medicare Other

## 2012-12-12 LAB — BASIC METABOLIC PANEL
BUN/Creatinine Ratio: 31 — ABNORMAL HIGH (ref 11–26)
BUN: 19 mg/dL (ref 8–27)
CO2: 22 mmol/L (ref 18–29)
Calcium: 8.9 mg/dL (ref 8.6–10.2)
Chloride: 98 mmol/L (ref 97–108)
Creatinine, Ser: 0.61 mg/dL (ref 0.57–1.00)
GFR calc Af Amer: 98 mL/min/{1.73_m2} (ref >59)
GFR calc non Af Amer: 85 mL/min/{1.73_m2} (ref >59)
Glucose: 194 mg/dL — ABNORMAL HIGH (ref 65–99)
Potassium: 4.4 mmol/L (ref 3.5–5.2)
Sodium: 137 mmol/L (ref 134–144)

## 2012-12-12 LAB — VALPROIC ACID LEVEL: Valproic Acid Lvl: 74 ug/mL (ref 50–100)

## 2012-12-16 ENCOUNTER — Ambulatory Visit: Payer: Medicare Other | Admitting: Nurse Practitioner

## 2012-12-16 ENCOUNTER — Encounter: Payer: Self-pay | Admitting: Nurse Practitioner

## 2012-12-16 ENCOUNTER — Ambulatory Visit (INDEPENDENT_AMBULATORY_CARE_PROVIDER_SITE_OTHER): Payer: Medicare Other | Admitting: Nurse Practitioner

## 2012-12-16 VITALS — BP 132/68 | HR 71 | Temp 97.9°F | Resp 14 | Ht 60.0 in | Wt 134.8 lb

## 2012-12-16 DIAGNOSIS — F0281 Dementia in other diseases classified elsewhere with behavioral disturbance: Secondary | ICD-10-CM

## 2012-12-16 DIAGNOSIS — E785 Hyperlipidemia, unspecified: Secondary | ICD-10-CM | POA: Diagnosis not present

## 2012-12-16 DIAGNOSIS — F411 Generalized anxiety disorder: Secondary | ICD-10-CM

## 2012-12-16 DIAGNOSIS — F419 Anxiety disorder, unspecified: Secondary | ICD-10-CM

## 2012-12-16 DIAGNOSIS — R739 Hyperglycemia, unspecified: Secondary | ICD-10-CM

## 2012-12-16 DIAGNOSIS — N390 Urinary tract infection, site not specified: Secondary | ICD-10-CM

## 2012-12-16 DIAGNOSIS — R7309 Other abnormal glucose: Secondary | ICD-10-CM

## 2012-12-16 NOTE — Progress Notes (Signed)
Patient ID: Charlotte Kelly, female   DOB: Jun 24, 1929, 79 y.o.   MRN: [id number]   No Known Allergies  Chief Complaint  Patient presents with  . Medical Managment of Chronic Issues    HPI: Patient is a 79 y.o. female seen in the office today for routine follow up.  No new concerns per family. Labs reviewed  Still seeing Dr Leanor Rubenstein but she does not change medication or prescribe- she is doing ongoing education. Caregiver and husband with pt today during the visit.  Reports everything going good with current medication changes.  Anxiety and behaviors have improved  Appetite has improved- weight gain since last visit  Review of Systems:  Provided by caregiver  Review of Systems  Constitutional: Negative for fever, chills, weight loss and malaise/fatigue.  Respiratory: Negative for cough and shortness of breath.   Cardiovascular: Negative for chest pain.  Gastrointestinal: Positive for heartburn (once in a while, but a tums helps). Negative for diarrhea and constipation.  Genitourinary:       Incontinence at times   Musculoskeletal: Negative.   Skin: Negative.   Psychiatric/Behavioral: Negative for depression. The patient is not nervous/anxious.      Past Medical History  Diagnosis Date  . Arthritis   . Depression   . Cataract   . Dementia in conditions classified elsewhere with behavioral disturbance(294.11)   . Anxiety   . Insomnia   . Insomnia, unspecified   . Other alteration of consciousness   . Hypertension   . Encounter for screening fecal occult blood testing 09/19/2012   Past Surgical History  Procedure Laterality Date  . Gallblader    . Small intestine surgery    . Cholecystectomy     Social History:   reports that she has quit smoking. She does not have any smokeless tobacco history on file. She reports that she does not drink alcohol or use illicit drugs.  Family History  Problem Relation Age of Onset  . Heart failure Father     Medications: Patient's  Medications  New Prescriptions   No medications on file  Previous Medications   ASPIRIN 81 MG TABLET    Take 81 mg by mouth daily. Take one tablet once daily to prevent stroke   B COMPLEX-C (SUPER B COMPLEX PO)    Take by mouth. Take one tablet once daily   CHOLECALCIFEROL (VITAMIN D) 2000 UNITS TABLET    Take 2,000 Units by mouth daily. Take one tablet once daily for vitamin d   CRANBERRY 250 MG CAPS    Take by mouth. Take one tablet once daily to prevent UTI   DIVALPROEX (DEPAKOTE SPRINKLES) 125 MG CAPSULE    Take 2 capsules (250 mg total) by mouth 3 (three) times daily.   DOCUSATE SODIUM (COLACE) 100 MG CAPSULE    Take 100 mg by mouth daily. Take one tablet once daily for irregularity   DONEPEZIL (ARICEPT) 10 MG TABLET    TAKE 1 TABLET BY MOUTH ONCE DAILY FOR MEMORY   FISH OIL-OMEGA-3 FATTY ACIDS 1000 MG CAPSULE    Take 2 g by mouth daily.   LISINOPRIL (PRINIVIL,ZESTRIL) 10 MG TABLET    Take 1 tablet (10 mg total) by mouth daily.   LORAZEPAM (ATIVAN) 0.5 MG TABLET    Take 0.5 mg by mouth every 8 (eight) hours as needed. Take one tablet every 8 hours as needed for anxiety.   MEMANTINE (NAMENDA) 10 MG TABLET    Take 10 mg by mouth 2 (two) times daily.  MULTIVITAMIN-IRON-MINERALS-FOLIC ACID (CENTRUM) CHEWABLE TABLET    Chew 1 tablet by mouth daily. Take one tablet once daily for supplement   NITROFURANTOIN, MACROCRYSTAL-MONOHYDRATE, (MACROBID) 100 MG CAPSULE    Take 1 capsule (100 mg total) by mouth at bedtime.   QUETIAPINE (SEROQUEL) 25 MG TABLET    1/2 tablet mid morning and 1 and 1/2 at bedtime for behaviors and sleep   SERTRALINE (ZOLOFT) 100 MG TABLET    Take 100 mg by mouth daily.   SERTRALINE (ZOLOFT) 50 MG TABLET    Take 50 mg by mouth daily.   TROSPIUM (SANCTURA) 20 MG TABLET    Take 20 mg by mouth 2 (two) times daily.   VITAMIN B-12 (CYANOCOBALAMIN) 500 MCG TABLET    Take 500 mcg by mouth daily. Take one tablet once daily for vitamin B12  Modified Medications   No medications on  file  Discontinued Medications   SERTRALINE (ZOLOFT) 100 MG TABLET    Take 150 mg by mouth daily.      Physical Exam:  Filed Vitals:   12/16/12 1321  BP: 132/68  Pulse: 71  Temp: 97.9 F (36.6 C)  TempSrc: Oral  Resp: 14  Height: 5' (1.524 m)  Weight: 134 lb 12.8 oz (61.145 kg)    Physical Exam  Constitutional: She is well-developed, well-nourished, and in no distress. No distress.  HENT:  Head: Normocephalic and atraumatic.  Eyes: Conjunctivae and EOM are normal. Pupils are equal, round, and reactive to light.  Neck: Normal range of motion. Neck supple.  Cardiovascular: Normal rate, regular rhythm and normal heart sounds.   Pulmonary/Chest: Effort normal and breath sounds normal.  Abdominal: Soft. Bowel sounds are normal.  Musculoskeletal: Normal range of motion. She exhibits no edema.  Neurological: She is alert.  Skin: Skin is warm and dry. She is not diaphoretic.     Labs reviewed: Basic Metabolic Panel:  Recent Labs  78/46/96 0948 11/06/12 1425 12/11/12 1409  NA 144 144 137  K 4.8 4.1 4.4  CL 103 104 98  CO2 25 20 22   GLUCOSE 137* 147* 194*  BUN 17 22 19   CREATININE 0.84 0.51* 0.61  CALCIUM 9.3 9.0 8.9  TSH 3.930  --   --    Liver Function Tests:  Recent Labs  09/17/12 0948 11/06/12 1425  AST 16 13  ALT 17 12  ALKPHOS 107 92  BILITOT 0.3 0.2  PROT 7.2 6.8   No results found for this basename: LIPASE, AMYLASE,  in the last 8760 hours No results found for this basename: AMMONIA,  in the last 8760 hours CBC:  Recent Labs  09/17/12 0948 11/06/12 1425  WBC 7.0 8.2  NEUTROABS 4.6 5.0  HGB 12.9 11.9  HCT 40.3 36.1  MCV 93 90   Lipid Panel:  Recent Labs  09/17/12 0948 10/01/12 0959  HDL 37* 40  LDLCALC 149* 115*  TRIG 236* 175*  CHOLHDL 6.3* 4.8*     Assessment/Plan  1.   Hyperglycemia 790.29      will follow up a1c before next visit    2.   Other and unspecified hyperlipidemia   Pt currently taking simvastatin which I was  unaware that she was taking this (pts husband did  not wish pt to be on this due to conversation with previous PCP) will stop simvastatin and  recheck lipids in 3 months     3.   Recurrent UTI 599.0    Stable on macroBID- Most recent urine neg  4.   Dementia in conditions classified elsewhere with behavioral disturbance  Behaviors have improved; to cont current medications     Family thinking about an elderly day care- number given for [facility] which they will  investigate     5.   Anxiety   Improved with recent medication change

## 2012-12-16 NOTE — Patient Instructions (Signed)
To follow up in 3 months with fasting lab work before Will recheck cholesterol and sugar    [facility] an [facility] [address] [place], [place] [address] [phone]

## 2012-12-17 ENCOUNTER — Ambulatory Visit: Payer: Medicare Other | Admitting: Nurse Practitioner

## 2012-12-17 NOTE — Progress Notes (Signed)
Spoke with patient's husband and informed him of normal labs.

## 2012-12-19 ENCOUNTER — Ambulatory Visit (INDEPENDENT_AMBULATORY_CARE_PROVIDER_SITE_OTHER): Payer: Medicare Other | Admitting: Nurse Practitioner

## 2012-12-19 ENCOUNTER — Encounter: Payer: Self-pay | Admitting: Nurse Practitioner

## 2012-12-19 ENCOUNTER — Ambulatory Visit: Payer: Medicare Other | Admitting: Internal Medicine

## 2012-12-19 VITALS — BP 130/80 | HR 76 | Temp 97.4°F | Resp 16 | Ht 60.0 in | Wt 132.8 lb

## 2012-12-19 DIAGNOSIS — F0281 Dementia in other diseases classified elsewhere with behavioral disturbance: Secondary | ICD-10-CM

## 2012-12-19 DIAGNOSIS — F29 Unspecified psychosis not due to a substance or known physiological condition: Secondary | ICD-10-CM | POA: Diagnosis not present

## 2012-12-19 DIAGNOSIS — W19XXXS Unspecified fall, sequela: Secondary | ICD-10-CM | POA: Diagnosis not present

## 2012-12-19 DIAGNOSIS — R410 Disorientation, unspecified: Secondary | ICD-10-CM

## 2012-12-19 NOTE — Progress Notes (Signed)
Patient ID: Charlotte Kelly, female   DOB: 1930-01-10, 79 y.o.   MRN: [id number]   No Known Allergies  Chief Complaint  Patient presents with  . Acute Visit    fell from bed, gait off,     HPI: Patient is a 79 y.o. female seen in the office today for follow up on fall- pt has been having a altered gait since Wednesday- on Wednesday morning she fell after pulling her pants down before she got to the bathroom- feel on her left side on her thigh; called EMS who evaluated her; no complaints of pain, bruising, or altered skin intergity however since then she can not walk without assistance     Each day she has improved with gait but not at 100% Yesterday she required feeding; but today she was able to feed herself Better spirits today per caregiver  Discussion of neurology consult between husband, caregiver, and daughter-- would like to have her evaluated at this time. Review of Systems:  Review of Systems  Constitutional: Negative for fever, chills and malaise/fatigue.  Respiratory: Negative for shortness of breath.   Cardiovascular: Negative for chest pain.  Gastrointestinal: Negative for abdominal pain, diarrhea and constipation.  Musculoskeletal: Positive for falls. Negative for myalgias, back pain and joint pain.  Neurological: Negative for weakness.       Reports pt is off balance and cautious  Psychiatric/Behavioral: Negative for depression.     Past Medical History  Diagnosis Date  . Arthritis   . Depression   . Cataract   . Dementia in conditions classified elsewhere with behavioral disturbance(294.11)   . Anxiety   . Insomnia   . Insomnia, unspecified   . Other alteration of consciousness   . Hypertension   . Encounter for screening fecal occult blood testing 09/19/2012   Past Surgical History  Procedure Laterality Date  . Gallblader    . Small intestine surgery    . Cholecystectomy     Social History:   reports that she has quit smoking. She does not have any  smokeless tobacco history on file. She reports that she does not drink alcohol or use illicit drugs.  Family History  Problem Relation Age of Onset  . Heart failure Father     Medications: Patient's Medications  New Prescriptions   No medications on file  Previous Medications   ASPIRIN 81 MG TABLET    Take 81 mg by mouth daily. Take one tablet once daily to prevent stroke   B COMPLEX-C (SUPER B COMPLEX PO)    Take by mouth. Take one tablet once daily   CHOLECALCIFEROL (VITAMIN D) 2000 UNITS TABLET    Take 2,000 Units by mouth daily. Take one tablet once daily for vitamin d   CRANBERRY 250 MG CAPS    Take by mouth. Take one tablet once daily to prevent UTI   DIVALPROEX (DEPAKOTE SPRINKLES) 125 MG CAPSULE    Take 2 capsules (250 mg total) by mouth 3 (three) times daily.   DOCUSATE SODIUM (COLACE) 100 MG CAPSULE    Take 100 mg by mouth daily. Take one tablet once daily for irregularity   DONEPEZIL (ARICEPT) 10 MG TABLET    TAKE 1 TABLET BY MOUTH ONCE DAILY FOR MEMORY   FISH OIL-OMEGA-3 FATTY ACIDS 1000 MG CAPSULE    Take 2 g by mouth daily.   LISINOPRIL (PRINIVIL,ZESTRIL) 10 MG TABLET    Take 1 tablet (10 mg total) by mouth daily.   LORAZEPAM (ATIVAN) 0.5 MG TABLET  Take 0.5 mg by mouth every 8 (eight) hours as needed. Take one tablet every 8 hours as needed for anxiety.   MEMANTINE (NAMENDA) 10 MG TABLET    Take 10 mg by mouth 2 (two) times daily.   MULTIVITAMIN-IRON-MINERALS-FOLIC ACID (CENTRUM) CHEWABLE TABLET    Chew 1 tablet by mouth daily. Take one tablet once daily for supplement   NITROFURANTOIN, MACROCRYSTAL-MONOHYDRATE, (MACROBID) 100 MG CAPSULE    Take 1 capsule (100 mg total) by mouth at bedtime.   QUETIAPINE (SEROQUEL) 25 MG TABLET    1/2 tablet mid morning and 1 and 1/2 at bedtime for behaviors and sleep   SERTRALINE (ZOLOFT) 100 MG TABLET    Take 100 mg by mouth daily.   SERTRALINE (ZOLOFT) 50 MG TABLET    Take 50 mg by mouth daily.   TROSPIUM (SANCTURA) 20 MG TABLET    Take  20 mg by mouth 2 (two) times daily.   VITAMIN B-12 (CYANOCOBALAMIN) 500 MCG TABLET    Take 500 mcg by mouth daily. Take one tablet once daily for vitamin B12  Modified Medications   No medications on file  Discontinued Medications   No medications on file     Physical Exam:  Filed Vitals:   12/19/12 1124  BP: 130/80  Pulse: 76  Temp: 97.4 F (36.3 C)  TempSrc: Oral  Resp: 16  Height: 5' (1.524 m)  Weight: 132 lb 12.8 oz (60.238 kg)  SpO2: 95%    Physical Exam  Constitutional: She is well-developed, well-nourished, and in no distress. No distress.  HENT:  Head: Normocephalic and atraumatic.  Mouth/Throat: Oropharynx is clear and moist. No oropharyngeal exudate.  Eyes: Conjunctivae and EOM are normal. Pupils are equal, round, and reactive to light.  Neck: Normal range of motion. Neck supple. No thyromegaly present.  Cardiovascular: Normal rate and regular rhythm.   Pulmonary/Chest: Effort normal and breath sounds normal. No respiratory distress.  Abdominal: Soft. Bowel sounds are normal. She exhibits no distension.  Musculoskeletal: Normal range of motion. She exhibits no edema and no tenderness.  Able to get up without assistance and move to the exam table without difficulty.  Neurological: She is alert.  Skin: Skin is warm and dry. No rash noted. She is not diaphoretic. No erythema.  Psychiatric: Affect normal.     Labs reviewed: Basic Metabolic Panel:  Recent Labs  16/10/96 0948 11/06/12 1425 12/11/12 1409  NA 144 144 137  K 4.8 4.1 4.4  CL 103 104 98  CO2 25 20 22   GLUCOSE 137* 147* 194*  BUN 17 22 19   CREATININE 0.84 0.51* 0.61  CALCIUM 9.3 9.0 8.9  TSH 3.930  --   --    Liver Function Tests:  Recent Labs  09/17/12 0948 11/06/12 1425  AST 16 13  ALT 17 12  ALKPHOS 107 92  BILITOT 0.3 0.2  PROT 7.2 6.8   No results found for this basename: LIPASE, AMYLASE,  in the last 8760 hours No results found for this basename: AMMONIA,  in the last 8760  hours CBC:  Recent Labs  09/17/12 0948 11/06/12 1425  WBC 7.0 8.2  NEUTROABS 4.6 5.0  HGB 12.9 11.9  HCT 40.3 36.1  MCV 93 90   Lipid Panel:  Recent Labs  09/17/12 0948 10/01/12 0959  HDL 37* 40  LDLCALC 149* 115*  TRIG 236* 175*  CHOLHDL 6.3* 4.8*         Assessment/Plan   1.   Confusion 298.9   -  will get cbc and cmp since she has had a fall and family notes some acute changes since last blood work was done   2.   Dementia in conditions classified elsewhere with behavioral disturbance(294.11) 294.11   - unchanged; family would like neurology referral. Will get referral at this time   3.   Status post fall- doing better today; able to get up and move without difficulty; moves all joints and extremities easily and without pain

## 2012-12-20 LAB — CBC WITH DIFFERENTIAL
Eos: 1 % (ref 0–5)
HCT: 38.1 % (ref 34.0–46.6)
Immature Granulocytes: 0 % (ref 0–2)
Lymphocytes Absolute: 1.7 10*3/uL (ref 0.7–3.1)
Lymphs: 20 % (ref 14–46)
MCV: 91 fL (ref 79–97)
Monocytes: 9 % (ref 4–12)
Neutrophils Absolute: 6.1 10*3/uL (ref 1.4–7.0)
RBC: 4.17 x10E6/uL (ref 3.77–5.28)
WBC: 8.8 10*3/uL (ref 3.4–10.8)

## 2012-12-20 LAB — COMPREHENSIVE METABOLIC PANEL
ALT: 16 IU/L (ref 0–32)
Albumin: 3.8 g/dL (ref 3.5–4.7)
BUN: 26 mg/dL (ref 8–27)
CO2: 26 mmol/L (ref 18–29)
Calcium: 9.2 mg/dL (ref 8.6–10.2)
Chloride: 101 mmol/L (ref 97–108)
Glucose: 165 mg/dL — ABNORMAL HIGH (ref 65–99)
Total Protein: 6.8 g/dL (ref 6.0–8.5)

## 2012-12-20 NOTE — Progress Notes (Signed)
Patient aware.

## 2013-01-03 DIAGNOSIS — D2370 Other benign neoplasm of skin of unspecified lower limb, including hip: Secondary | ICD-10-CM | POA: Diagnosis not present

## 2013-01-03 DIAGNOSIS — M2010 Hallux valgus (acquired), unspecified foot: Secondary | ICD-10-CM | POA: Diagnosis not present

## 2013-01-03 DIAGNOSIS — M79609 Pain in unspecified limb: Secondary | ICD-10-CM | POA: Diagnosis not present

## 2013-01-05 ENCOUNTER — Other Ambulatory Visit: Payer: Self-pay | Admitting: Internal Medicine

## 2013-01-06 ENCOUNTER — Ambulatory Visit: Payer: Medicare Other | Admitting: Neurology

## 2013-01-13 ENCOUNTER — Other Ambulatory Visit: Payer: Self-pay | Admitting: Internal Medicine

## 2013-01-16 ENCOUNTER — Encounter (HOSPITAL_COMMUNITY): Payer: Self-pay | Admitting: *Deleted

## 2013-01-16 ENCOUNTER — Emergency Department (INDEPENDENT_AMBULATORY_CARE_PROVIDER_SITE_OTHER)
Admission: EM | Admit: 2013-01-16 | Discharge: 2013-01-16 | Disposition: A | Discharge status: Home / Self Care | Payer: Medicare Other | Source: Home / Self Care | Attending: Family Medicine | Admitting: Family Medicine

## 2013-01-16 DIAGNOSIS — S99929A Unspecified injury of unspecified foot, initial encounter: Secondary | ICD-10-CM

## 2013-01-16 DIAGNOSIS — Z0000 Encounter for general adult medical examination without abnormal findings: Secondary | ICD-10-CM

## 2013-01-16 DIAGNOSIS — S8990XA Unspecified injury of unspecified lower leg, initial encounter: Secondary | ICD-10-CM | POA: Diagnosis not present

## 2013-01-16 DIAGNOSIS — S99919A Unspecified injury of unspecified ankle, initial encounter: Secondary | ICD-10-CM

## 2013-01-16 HISTORY — DX: Dementia in other diseases classified elsewhere, unspecified severity, without behavioral disturbance, psychotic disturbance, mood disturbance, and anxiety: F02.80

## 2013-01-16 HISTORY — DX: Alzheimer's disease, unspecified: G30.9

## 2013-01-16 NOTE — ED Notes (Signed)
Spouse states he heard a "thud", went into living room and noticed the "very heavy glass globe" decoration was on the floor, and pt was standing next to it.  Spouse tried to determine if either of her feet or toes were injured, but pt inconsistent in response.  Both feet appear unremarkable.  Spouse states she ambulated without any difficulty at home following incident.

## 2013-01-20 ENCOUNTER — Emergency Department (HOSPITAL_COMMUNITY)
Admission: EM | Admit: 2013-01-20 | Discharge: 2013-01-20 | Disposition: A | Discharge status: Home / Self Care | Payer: Medicare Other | Attending: Emergency Medicine | Admitting: Emergency Medicine

## 2013-01-20 ENCOUNTER — Encounter (HOSPITAL_COMMUNITY): Payer: Self-pay | Admitting: Emergency Medicine

## 2013-01-20 ENCOUNTER — Emergency Department (HOSPITAL_COMMUNITY): Payer: Medicare Other

## 2013-01-20 DIAGNOSIS — M129 Arthropathy, unspecified: Secondary | ICD-10-CM | POA: Diagnosis not present

## 2013-01-20 DIAGNOSIS — R279 Unspecified lack of coordination: Secondary | ICD-10-CM | POA: Insufficient documentation

## 2013-01-20 DIAGNOSIS — M25469 Effusion, unspecified knee: Secondary | ICD-10-CM | POA: Diagnosis not present

## 2013-01-20 DIAGNOSIS — Z79899 Other long term (current) drug therapy: Secondary | ICD-10-CM | POA: Diagnosis not present

## 2013-01-20 DIAGNOSIS — F411 Generalized anxiety disorder: Secondary | ICD-10-CM | POA: Diagnosis not present

## 2013-01-20 DIAGNOSIS — Y929 Unspecified place or not applicable: Secondary | ICD-10-CM | POA: Insufficient documentation

## 2013-01-20 DIAGNOSIS — I10 Essential (primary) hypertension: Secondary | ICD-10-CM | POA: Insufficient documentation

## 2013-01-20 DIAGNOSIS — Z87891 Personal history of nicotine dependence: Secondary | ICD-10-CM | POA: Diagnosis not present

## 2013-01-20 DIAGNOSIS — F0281 Dementia in other diseases classified elsewhere with behavioral disturbance: Secondary | ICD-10-CM | POA: Diagnosis not present

## 2013-01-20 DIAGNOSIS — M169 Osteoarthritis of hip, unspecified: Secondary | ICD-10-CM | POA: Diagnosis not present

## 2013-01-20 DIAGNOSIS — F0280 Dementia in other diseases classified elsewhere without behavioral disturbance: Secondary | ICD-10-CM | POA: Diagnosis not present

## 2013-01-20 DIAGNOSIS — G4700 Insomnia, unspecified: Secondary | ICD-10-CM | POA: Insufficient documentation

## 2013-01-20 DIAGNOSIS — M25569 Pain in unspecified knee: Secondary | ICD-10-CM | POA: Insufficient documentation

## 2013-01-20 DIAGNOSIS — S8990XA Unspecified injury of unspecified lower leg, initial encounter: Secondary | ICD-10-CM | POA: Diagnosis not present

## 2013-01-20 DIAGNOSIS — W19XXXA Unspecified fall, initial encounter: Secondary | ICD-10-CM | POA: Insufficient documentation

## 2013-01-20 DIAGNOSIS — H269 Unspecified cataract: Secondary | ICD-10-CM | POA: Diagnosis not present

## 2013-01-20 DIAGNOSIS — F329 Major depressive disorder, single episode, unspecified: Secondary | ICD-10-CM | POA: Insufficient documentation

## 2013-01-20 DIAGNOSIS — Z7982 Long term (current) use of aspirin: Secondary | ICD-10-CM | POA: Insufficient documentation

## 2013-01-20 DIAGNOSIS — G309 Alzheimer's disease, unspecified: Secondary | ICD-10-CM | POA: Insufficient documentation

## 2013-01-20 DIAGNOSIS — F3289 Other specified depressive episodes: Secondary | ICD-10-CM | POA: Insufficient documentation

## 2013-01-20 DIAGNOSIS — M25561 Pain in right knee: Secondary | ICD-10-CM

## 2013-01-20 DIAGNOSIS — M1710 Unilateral primary osteoarthritis, unspecified knee: Secondary | ICD-10-CM | POA: Diagnosis not present

## 2013-01-20 DIAGNOSIS — Y939 Activity, unspecified: Secondary | ICD-10-CM | POA: Insufficient documentation

## 2013-01-20 DIAGNOSIS — S99919A Unspecified injury of unspecified ankle, initial encounter: Secondary | ICD-10-CM | POA: Diagnosis not present

## 2013-01-20 DIAGNOSIS — F02818 Dementia in other diseases classified elsewhere, unspecified severity, with other behavioral disturbance: Secondary | ICD-10-CM | POA: Insufficient documentation

## 2013-01-20 NOTE — ED Provider Notes (Signed)
CSN: [id number]     Arrival date & time 01/20/13  1142 History     First MD Initiated Contact with Patient 01/20/13 1152     Chief Complaint  Patient presents with  . Knee Injury   (Consider location/radiation/quality/duration/timing/severity/associated sxs/prior Treatment) Patient is a 79 y.o. female presenting with fall. The history is provided by the spouse and a caregiver.  Fall This is a new problem. The current episode started yesterday. The problem occurs rarely. The problem has been unchanged. The symptoms are aggravated by bending, standing and walking. She has tried rest and lying down for the symptoms. The treatment provided mild relief.    Past Medical History  Diagnosis Date  . Arthritis   . Depression   . Cataract   . Dementia in conditions classified elsewhere with behavioral disturbance(294.11)   . Anxiety   . Insomnia   . Insomnia, unspecified   . Other alteration of consciousness   . Hypertension   . Encounter for screening fecal occult blood testing 09/19/2012  . Alzheimer disease    Past Surgical History  Procedure Laterality Date  . Gallblader    . Small intestine surgery    . Cholecystectomy     Family History  Problem Relation Age of Onset  . Heart failure Father    History  Substance Use Topics  . Smoking status: Former Games developer  . Smokeless tobacco: Not on file  . Alcohol Use: No   OB History   Grav Para Term Preterm Abortions TAB SAB Ect Mult Living                 Review of Systems  Unable to perform ROS: Dementia  Eyes: Negative.     Allergies  Review of patient's allergies indicates no known allergies.  Home Medications   Current Outpatient Rx  Name  Route  Sig  Dispense  Refill  . aspirin 81 MG tablet   Oral   Take 81 mg by mouth daily. Take one tablet once daily to prevent stroke         . B Complex-C (SUPER B COMPLEX PO)   Oral   Take by mouth. Take one tablet once daily         . Cholecalciferol (VITAMIN D) 2000  UNITS tablet   Oral   Take 2,000 Units by mouth daily. Take one tablet once daily for vitamin d         . Cranberry 250 MG CAPS   Oral   Take by mouth. Take one tablet once daily to prevent UTI         . divalproex (DEPAKOTE SPRINKLES) 125 MG capsule   Oral   Take 2 capsules (250 mg total) by mouth 3 (three) times daily.   90 capsule   0   . docusate sodium (COLACE) 100 MG capsule   Oral   Take 100 mg by mouth daily. Take one tablet once daily for irregularity         . donepezil (ARICEPT) 10 MG tablet      TAKE 1 TABLET BY MOUTH EVERY DAY FOR MEMORY   30 tablet   0   . fish oil-omega-3 fatty acids 1000 MG capsule   Oral   Take 2 g by mouth daily.         Marland Kitchen lisinopril (PRINIVIL,ZESTRIL) 10 MG tablet   Oral   Take 1 tablet (10 mg total) by mouth daily.   90 tablet   3   .  LORazepam (ATIVAN) 0.5 MG tablet   Oral   Take 0.5 mg by mouth every 8 (eight) hours as needed. Take one tablet every 8 hours as needed for anxiety.         . memantine (NAMENDA) 10 MG tablet   Oral   Take 10 mg by mouth 2 (two) times daily.         . multivitamin-iron-minerals-folic acid (CENTRUM) chewable tablet   Oral   Chew 1 tablet by mouth daily. Take one tablet once daily for supplement         . nitrofurantoin, macrocrystal-monohydrate, (MACROBID) 100 MG capsule   Oral   Take 1 capsule (100 mg total) by mouth at bedtime.   30 capsule   4   . QUEtiapine (SEROQUEL) 25 MG tablet      1/2 tablet mid morning and 1 and 1/2 at bedtime for behaviors and sleep   60 tablet   3   . sertraline (ZOLOFT) 100 MG tablet   Oral   Take 100 mg by mouth daily.         . sertraline (ZOLOFT) 25 MG tablet      TAKE 1 TABLET BY MOUTH DAILY WITH THE 100 MG TABLET   90 tablet   0   . sertraline (ZOLOFT) 50 MG tablet   Oral   Take 50 mg by mouth daily.         . trospium (SANCTURA) 20 MG tablet   Oral   Take 20 mg by mouth 2 (two) times daily.         . vitamin B-12  (CYANOCOBALAMIN) 500 MCG tablet   Oral   Take 500 mcg by mouth daily. Take one tablet once daily for vitamin B12          BP 131/70  Pulse 63  Temp(Src) 98.1 F (36.7 C) (Oral)  Resp 16  SpO2 98% Physical Exam  Nursing note and vitals reviewed. Constitutional: She appears well-developed and well-nourished. No distress.  HENT:  Head: Normocephalic and atraumatic.  Right Ear: External ear normal.  Left Ear: External ear normal.  Nose: Nose normal.  Mouth/Throat: Oropharynx is clear and moist. No oropharyngeal exudate.  Eyes: Conjunctivae and EOM are normal. Pupils are equal, round, and reactive to light. Right eye exhibits no discharge. Left eye exhibits no discharge.  Neck: Normal range of motion. Neck supple. No JVD present. No tracheal deviation present. No thyromegaly present.  Cardiovascular: Normal rate, regular rhythm, normal heart sounds and intact distal pulses.  Exam reveals no gallop and no friction rub.   No murmur heard. Pulmonary/Chest: Effort normal and breath sounds normal. No respiratory distress. She has no wheezes. She has no rales. She exhibits no tenderness.  Abdominal: Soft. Bowel sounds are normal. She exhibits no distension. There is no tenderness. There is no rebound and no guarding.  Musculoskeletal: Normal range of motion.       Right hip: She exhibits normal range of motion, normal strength, no tenderness, no bony tenderness and no swelling.       Right knee: She exhibits swelling. She exhibits no effusion, no ecchymosis, no deformity, no laceration, no erythema, no LCL laxity, normal patellar mobility and no MCL laxity. Tenderness (over the lateral tibial plateau) found.  Lymphadenopathy:    She has no cervical adenopathy.  Neurological: She is alert. No cranial nerve deficit.  Skin: Skin is warm. No rash noted. She is not diaphoretic.    ED Course   Procedures (including  critical care time)  Labs Reviewed - No data to display Dg Hip Complete  Right  01/20/2013   *RADIOLOGY REPORT*  Clinical Data: Right hip pain  RIGHT HIP - COMPLETE 2+ VIEW  Comparison: None.  Findings: No fracture or dislocation is noted.  No significant degenerative change is noted in right hip. Degenerative change of pubic symphysis is noted.  IMPRESSION: Normal right hip. Degenerative change of pubic symphysis is noted.   Original Report Authenticated By: Lupita Raider.,  M.D.   Dg Knee 2 Views Right  01/20/2013   *RADIOLOGY REPORT*  Clinical Data: Right knee pain  RIGHT KNEE - 1-2 VIEW  Comparison: None.  Findings: No fracture or dislocation is noted.  No joint effusion is noted. Minimal osteophyte formation is noted laterally as well as minimal spurring of tibial spines. No significant joint space narrowing is noted.  IMPRESSION: Minimal degenerative joint disease.   Original Report Authenticated By: Lupita Raider.,  M.D.   1. Fall, initial encounter   2. Right knee pain     MDM  79 yr old F pt with advanced dementia who at baseline is having worsening ataxia and is not communicative presents after having fall last night. Fall was witness by husband who is at bedside. He said she was walking and lost her balance and fell on the right knee. There is not apparent pain to the right hip or ankle but patient does not seem to communicate pain well. Normal ROM without pain. Swelling over lateral lower knee. No joint laxity. Will get xrays.  Imaging is grossly normal. No large knee effusion.  After speaking with patient and family they are still worried about occult fxr. CT was ordered which was normal and patient was safely discharged.  Case discussed with Dr. Benjamin Stain, MD 01/20/13 1247  Sherryl Manges, MD 01/22/13 [phone]

## 2013-01-20 NOTE — ED Notes (Signed)
Spoke with Dr Lew Dawes verbal order to give patient a [place] sandwich, apple sauce, and water.

## 2013-01-20 NOTE — ED Provider Notes (Signed)
CSN: [id number]     Arrival date & time 01/16/13  1840 History     First MD Initiated Contact with Patient 01/16/13 1937     Chief Complaint  Patient presents with  . Foot Injury   (Consider location/radiation/quality/duration/timing/severity/associated sxs/prior Treatment) HPI Comments: 79 y/o female with h/o alzheimer's dementia. Here with husband and care taker concerned as patient was found close to a table and a big heavy glass ball has been dropped to the floor. Husband concerned the ball could have hit her foot. Patient does not answer specific questions as per base line. There has not been otherwise any changes in her behaviors the could signal pain or discomfort. No swelling redness or wounds. No limping. Patient gate remains at base line.     Past Medical History  Diagnosis Date  . Arthritis   . Depression   . Cataract   . Dementia in conditions classified elsewhere with behavioral disturbance(294.11)   . Anxiety   . Insomnia   . Insomnia, unspecified   . Other alteration of consciousness   . Hypertension   . Encounter for screening fecal occult blood testing 09/19/2012  . Alzheimer disease    Past Surgical History  Procedure Laterality Date  . Gallblader    . Small intestine surgery    . Cholecystectomy     Family History  Problem Relation Age of Onset  . Heart failure Father    History  Substance Use Topics  . Smoking status: Former Games developer  . Smokeless tobacco: Not on file  . Alcohol Use: No   OB History   Grav Para Term Preterm Abortions TAB SAB Ect Mult Living                 Review of Systems  Constitutional: Negative for diaphoresis, activity change and appetite change.  Gastrointestinal: Negative for vomiting.  Skin: Negative for color change, rash and wound.  Neurological: Negative for seizures and syncope.    Allergies  Review of patient's allergies indicates no known allergies.  Home Medications   Current Outpatient Rx  Name  Route  Sig   Dispense  Refill  . aspirin 81 MG tablet   Oral   Take 81 mg by mouth daily. Take one tablet once daily to prevent stroke         . B Complex-C (SUPER B COMPLEX PO)   Oral   Take by mouth. Take one tablet once daily         . Cholecalciferol (VITAMIN D) 2000 UNITS tablet   Oral   Take 2,000 Units by mouth daily. Take one tablet once daily for vitamin d         . Cranberry 250 MG CAPS   Oral   Take by mouth. Take one tablet once daily to prevent UTI         . divalproex (DEPAKOTE SPRINKLES) 125 MG capsule   Oral   Take 2 capsules (250 mg total) by mouth 3 (three) times daily.   90 capsule   0   . docusate sodium (COLACE) 100 MG capsule   Oral   Take 100 mg by mouth daily. Take one tablet once daily for irregularity         . donepezil (ARICEPT) 10 MG tablet      TAKE 1 TABLET BY MOUTH EVERY DAY FOR MEMORY   30 tablet   0   . fish oil-omega-3 fatty acids 1000 MG capsule   Oral   Take  2 g by mouth daily.         Marland Kitchen lisinopril (PRINIVIL,ZESTRIL) 10 MG tablet   Oral   Take 1 tablet (10 mg total) by mouth daily.   90 tablet   3   . LORazepam (ATIVAN) 0.5 MG tablet   Oral   Take 0.5 mg by mouth every 8 (eight) hours as needed. Take one tablet every 8 hours as needed for anxiety.         . memantine (NAMENDA) 10 MG tablet   Oral   Take 10 mg by mouth 2 (two) times daily.         . multivitamin-iron-minerals-folic acid (CENTRUM) chewable tablet   Oral   Chew 1 tablet by mouth daily. Take one tablet once daily for supplement         . QUEtiapine (SEROQUEL) 25 MG tablet      1/2 tablet mid morning and 1 and 1/2 at bedtime for behaviors and sleep   60 tablet   3   . sertraline (ZOLOFT) 25 MG tablet      TAKE 1 TABLET BY MOUTH DAILY WITH THE 100 MG TABLET   90 tablet   0   . trospium (SANCTURA) 20 MG tablet   Oral   Take 20 mg by mouth 2 (two) times daily.         . vitamin B-12 (CYANOCOBALAMIN) 500 MCG tablet   Oral   Take 500 mcg by  mouth daily. Take one tablet once daily for vitamin B12         . nitrofurantoin, macrocrystal-monohydrate, (MACROBID) 100 MG capsule   Oral   Take 1 capsule (100 mg total) by mouth at bedtime.   30 capsule   4   . sertraline (ZOLOFT) 100 MG tablet   Oral   Take 100 mg by mouth daily.         . sertraline (ZOLOFT) 50 MG tablet   Oral   Take 50 mg by mouth daily.          BP 157/78  Pulse 60  Temp(Src) 97.4 F (36.3 C) (Oral)  Resp 16  SpO2 98% Physical Exam  Nursing note and vitals reviewed. Constitutional: She is oriented to person, place, and time. She appears well-developed and well-nourished.  Pleasant smiles  HENT:  Head: Normocephalic and atraumatic.  Eyes: Conjunctivae are normal. Pupils are equal, round, and reactive to light.  Neck: Neck supple.  Cardiovascular: Normal heart sounds.   Abdominal: Soft.  Musculoskeletal: Normal range of motion. She exhibits no edema and no tenderness.  Feet: No deformities, swelling, ecchymosis, hematomas or wounds. No obvious signs of of injury.   Neurological: She is alert and oriented to person, place, and time.  Skin: No rash noted. She is not diaphoretic. No erythema.  Psychiatric:  Unable to understand and answer questions appropriately.     ED Course   Procedures (including critical care time)  Labs Reviewed - No data to display No results found. 1. Normal physical exam   2. Injury, foot, unspecified laterality, initial encounter     MDM  reccommended to look for any inflammatory changes or changes in behavior or activity specially gait problems and return if any. Continue regular care as prior.   Sharin Grave, MD 01/20/13 [phone]

## 2013-01-20 NOTE — ED Notes (Signed)
Patient transported to X-ray 

## 2013-01-20 NOTE — ED Notes (Signed)
Husband stated, she was walking and fell yesterday and hurt her rt. Knee. She has Alzheimer's disease pt.

## 2013-01-23 NOTE — ED Provider Notes (Signed)
I saw and evaluated the patient, reviewed the resident's note and I agree with the findings and plan. Patient with knee pain after fall. Has advanced dementia. CT done due to continued pain. No fracture. Discharge home  Juliet Rude. Rubin Payor, MD 01/23/13 [phone]

## 2013-01-30 ENCOUNTER — Encounter (HOSPITAL_COMMUNITY): Payer: Self-pay | Admitting: *Deleted

## 2013-01-30 ENCOUNTER — Emergency Department (HOSPITAL_COMMUNITY)
Admission: EM | Admit: 2013-01-30 | Discharge: 2013-01-30 | Disposition: A | Discharge status: Home / Self Care | Payer: Medicare Other | Attending: Emergency Medicine | Admitting: Emergency Medicine

## 2013-01-30 ENCOUNTER — Emergency Department (HOSPITAL_COMMUNITY): Payer: Medicare Other

## 2013-01-30 DIAGNOSIS — F02818 Dementia in other diseases classified elsewhere, unspecified severity, with other behavioral disturbance: Secondary | ICD-10-CM | POA: Insufficient documentation

## 2013-01-30 DIAGNOSIS — F0280 Dementia in other diseases classified elsewhere without behavioral disturbance: Secondary | ICD-10-CM | POA: Diagnosis not present

## 2013-01-30 DIAGNOSIS — I10 Essential (primary) hypertension: Secondary | ICD-10-CM | POA: Diagnosis not present

## 2013-01-30 DIAGNOSIS — Z79899 Other long term (current) drug therapy: Secondary | ICD-10-CM | POA: Insufficient documentation

## 2013-01-30 DIAGNOSIS — G4700 Insomnia, unspecified: Secondary | ICD-10-CM | POA: Insufficient documentation

## 2013-01-30 DIAGNOSIS — M129 Arthropathy, unspecified: Secondary | ICD-10-CM | POA: Diagnosis not present

## 2013-01-30 DIAGNOSIS — Y9389 Activity, other specified: Secondary | ICD-10-CM | POA: Insufficient documentation

## 2013-01-30 DIAGNOSIS — F411 Generalized anxiety disorder: Secondary | ICD-10-CM | POA: Insufficient documentation

## 2013-01-30 DIAGNOSIS — S0990XA Unspecified injury of head, initial encounter: Secondary | ICD-10-CM | POA: Diagnosis not present

## 2013-01-30 DIAGNOSIS — Z7982 Long term (current) use of aspirin: Secondary | ICD-10-CM | POA: Diagnosis not present

## 2013-01-30 DIAGNOSIS — F329 Major depressive disorder, single episode, unspecified: Secondary | ICD-10-CM | POA: Diagnosis not present

## 2013-01-30 DIAGNOSIS — Z87891 Personal history of nicotine dependence: Secondary | ICD-10-CM | POA: Insufficient documentation

## 2013-01-30 DIAGNOSIS — F0281 Dementia in other diseases classified elsewhere with behavioral disturbance: Secondary | ICD-10-CM | POA: Insufficient documentation

## 2013-01-30 DIAGNOSIS — Y9289 Other specified places as the place of occurrence of the external cause: Secondary | ICD-10-CM | POA: Insufficient documentation

## 2013-01-30 DIAGNOSIS — G309 Alzheimer's disease, unspecified: Secondary | ICD-10-CM | POA: Insufficient documentation

## 2013-01-30 DIAGNOSIS — F3289 Other specified depressive episodes: Secondary | ICD-10-CM | POA: Insufficient documentation

## 2013-01-30 DIAGNOSIS — R51 Headache: Secondary | ICD-10-CM | POA: Diagnosis not present

## 2013-01-30 DIAGNOSIS — W2209XA Striking against other stationary object, initial encounter: Secondary | ICD-10-CM | POA: Insufficient documentation

## 2013-01-30 MED ORDER — ACETAMINOPHEN 325 MG PO TABS
650.0000 mg | ORAL_TABLET | Freq: Once | ORAL | Status: AC
  Administered 2013-01-30: 650 mg via ORAL
  Filled 2013-01-30: qty 2

## 2013-01-30 NOTE — ED Provider Notes (Signed)
CSN: [id number]     Arrival date & time 01/30/13  1022 History     First MD Initiated Contact with Patient 01/30/13 1039     Chief Complaint  Patient presents with  . Head Injury   (Consider location/radiation/quality/duration/timing/severity/associated sxs/prior Treatment) HPI Comments: 79 year old female with a history of Alzheimer's dementia presents with caregiver after a head injury last night. Per the caregiver the patient opened a closet door and struck herself in the temple the accident. There is no loss of consciousness. This morning when the caregiver saw her she was acting a low more sluggish than normal was complaining of pain so she brought her in for an evaluation. I am unable to give further history due to her dementia , except the patient is on only a baby aspirin as far as blood thinners.  Patient is a 79 y.o. female presenting with head injury. The history is provided by a caregiver.  Head Injury Location:  R temporal Mechanism of injury: direct blow   Pain details:    Quality:  Unable to specify Chronicity:  New Relieved by:  None tried Associated symptoms: headache   Associated symptoms: no vomiting     Past Medical History  Diagnosis Date  . Arthritis   . Depression   . Cataract   . Dementia in conditions classified elsewhere with behavioral disturbance(294.11)   . Anxiety   . Insomnia   . Insomnia, unspecified   . Other alteration of consciousness   . Hypertension   . Encounter for screening fecal occult blood testing 09/19/2012  . Alzheimer disease    Past Surgical History  Procedure Laterality Date  . Gallblader    . Small intestine surgery    . Cholecystectomy     Family History  Problem Relation Age of Onset  . Heart failure Father    History  Substance Use Topics  . Smoking status: Former Games developer  . Smokeless tobacco: Not on file  . Alcohol Use: No   OB History   Grav Para Term Preterm Abortions TAB SAB Ect Mult Living                 Review of Systems  Unable to perform ROS: Dementia  Gastrointestinal: Negative for vomiting.  Neurological: Positive for headaches.    Allergies  Review of patient's allergies indicates no known allergies.  Home Medications   Current Outpatient Rx  Name  Route  Sig  Dispense  Refill  . aspirin 81 MG tablet   Oral   Take 81 mg by mouth daily.          . B Complex-C (SUPER B COMPLEX PO)   Oral   Take 1 tablet by mouth daily.          . Cholecalciferol (VITAMIN D) 2000 UNITS tablet   Oral   Take 2,000 Units by mouth daily.          . Cranberry 250 MG CAPS   Oral   Take 1 capsule by mouth daily. daily to prevent UTI         . divalproex (DEPAKOTE SPRINKLES) 125 MG capsule   Oral   Take 2 capsules (250 mg total) by mouth 3 (three) times daily.   90 capsule   0   . docusate sodium (COLACE) 100 MG capsule   Oral   Take 100 mg by mouth daily.          Marland Kitchen donepezil (ARICEPT) 10 MG tablet   Oral  Take 10 mg by mouth at bedtime.         . fish oil-omega-3 fatty acids 1000 MG capsule   Oral   Take 2 g by mouth daily.         Marland Kitchen lisinopril (PRINIVIL,ZESTRIL) 10 MG tablet   Oral   Take 1 tablet (10 mg total) by mouth daily.   90 tablet   3   . LORazepam (ATIVAN) 0.5 MG tablet   Oral   Take 0.5 mg by mouth every 8 (eight) hours as needed. Take one tablet every 8 hours as needed for anxiety.         . memantine (NAMENDA) 10 MG tablet   Oral   Take 10 mg by mouth 2 (two) times daily.         . multivitamin-iron-minerals-folic acid (CENTRUM) chewable tablet   Oral   Chew 1 tablet by mouth daily. Take one tablet once daily for supplement         . nitrofurantoin, macrocrystal-monohydrate, (MACROBID) 100 MG capsule   Oral   Take 1 capsule (100 mg total) by mouth at bedtime.   30 capsule   4   . QUEtiapine (SEROQUEL) 25 MG tablet   Oral   Take 12.5-37.5 mg by mouth 2 (two) times daily. 1/2 tablet mid morning and 1 and 1/2 at bedtime for  behaviors and sleep         . sertraline (ZOLOFT) 100 MG tablet   Oral   Take 100 mg by mouth daily. Take daily along with 50mg  tab         . sertraline (ZOLOFT) 50 MG tablet   Oral   Take 50 mg by mouth daily. Take daily along with 100mg  tab         . trospium (SANCTURA) 20 MG tablet   Oral   Take 20 mg by mouth 2 (two) times daily.         . vitamin B-12 (CYANOCOBALAMIN) 500 MCG tablet   Oral   Take 500 mcg by mouth daily.           BP 148/66  Pulse 66  Temp(Src) 98 F (36.7 C) (Oral)  Resp 20  Ht 5' (1.524 m)  Wt 132 lb (59.875 kg)  BMI 25.78 kg/m2  SpO2 97% Physical Exam  Nursing note and vitals reviewed. Constitutional: She appears well-developed and well-nourished.  HENT:  Head: Normocephalic and atraumatic.    Right Ear: Tympanic membrane and external ear normal. No hemotympanum.  Left Ear: Tympanic membrane and external ear normal. No hemotympanum.  Nose: Nose normal.  Eyes: EOM are normal. Pupils are equal, round, and reactive to light. Right eye exhibits no discharge. Left eye exhibits no discharge.  Cardiovascular: Normal rate, regular rhythm and normal heart sounds.   Pulmonary/Chest: Effort normal and breath sounds normal.  Abdominal: Soft. There is no tenderness.  Neurological: She is alert. She is disoriented.  Grossly moves all 4 extremities but is poor at following commands  Skin: Skin is warm and dry.    ED Course   Procedures (including critical care time)  Labs Reviewed - No data to display Ct Head Wo Contrast  01/30/2013   *RADIOLOGY REPORT*  Clinical Data: Pain post trauma; Alzheimer's disease  CT HEAD WITHOUT CONTRAST  Technique:  Contiguous axial images were obtained from the base of the skull through the vertex without contrast. Study was obtained within 24 hours of patient arrival at the emergency department.  Comparison: Moderate diffuse  atrophy, somewhat greater in the temporal lobes bilaterally and elsewhere, is stable.   There is no mass, hemorrhage, extra-axial fluid collection, or midline shift. There is mild small vessel disease in the centra semiovale bilaterally, stable.  There is no acute infarct seen on this study.  The bony calvarium appears intact.  The mastoid air cells are clear.  There is a bony overgrowth with arthropathy in the temporomandibular joint region on the left, stable.  IMPRESSION:   Diffuse atrophy with mild small vessel disease.  No mass, hemorrhage, or acute appearing infarct.  Arthropathy in the left temporomandibular joint region is stable.   Original Report Authenticated By: Bretta Bang, M.D.   1. Closed head injury, initial encounter     MDM  79 year old female with history of dementia presents over 12 hours after injuring herself head with a closet door. No LOC or vomiting. She has been slightly less active but otherwise the family has not noticed any trouble speaking or focal neurologic deficits. CT head negative. Discussed head injury precautions with the family and will discharge her home.  Audree Camel, MD 01/30/13 [phone]

## 2013-01-30 NOTE — ED Notes (Signed)
Patient transported to CT 

## 2013-01-30 NOTE — ED Notes (Signed)
Pt with advanced alzheimers & poor historian. Care giver & husband reports pt had hit herself to right side forehead with a door. Incident was witnessed, no LOC. Pt went to bed right afterwards.  Dayshift caregiver states pt c/o h/a & is not acting her normal "peppy" self. No other injuries noted.  Small hematoma noted to right side forehead near eyebrow. Area palpated, pt denies pain at this time.

## 2013-01-30 NOTE — ED Notes (Signed)
PT brought to ED by caregiver for head injury.  Caregiver states night shift said pt hit herself in the head with a closet door (do not know if it was witnessed).  Purple bruising to R temple.  Caregiver states pt isn't her "peppy" self.

## 2013-02-03 ENCOUNTER — Ambulatory Visit (INDEPENDENT_AMBULATORY_CARE_PROVIDER_SITE_OTHER): Payer: Medicare Other | Admitting: Internal Medicine

## 2013-02-03 ENCOUNTER — Encounter: Payer: Self-pay | Admitting: Internal Medicine

## 2013-02-03 VITALS — BP 130/70 | HR 61 | Temp 97.4°F | Resp 18 | Wt 132.0 lb

## 2013-02-03 DIAGNOSIS — N390 Urinary tract infection, site not specified: Secondary | ICD-10-CM | POA: Diagnosis not present

## 2013-02-03 DIAGNOSIS — F410 Panic disorder [episodic paroxysmal anxiety] without agoraphobia: Secondary | ICD-10-CM | POA: Diagnosis not present

## 2013-02-03 DIAGNOSIS — F02818 Dementia in other diseases classified elsewhere, unspecified severity, with other behavioral disturbance: Secondary | ICD-10-CM | POA: Diagnosis not present

## 2013-02-03 DIAGNOSIS — F0281 Dementia in other diseases classified elsewhere with behavioral disturbance: Secondary | ICD-10-CM | POA: Diagnosis not present

## 2013-02-03 DIAGNOSIS — K5900 Constipation, unspecified: Secondary | ICD-10-CM

## 2013-02-03 MED ORDER — MEMANTINE HCL 10 MG PO TABS: 10.0000 mg | ORAL_TABLET | Freq: Two times a day (BID) | ORAL | Status: DC

## 2013-02-03 MED ORDER — SENNA 8.6 MG PO TABS: 1.0000 | ORAL_TABLET | Freq: Every day | ORAL | Status: DC

## 2013-02-03 MED ORDER — DONEPEZIL HCL 10 MG PO TABS: 10.0000 mg | ORAL_TABLET | Freq: Every day | ORAL | Status: DC

## 2013-02-03 MED ORDER — QUETIAPINE FUMARATE 25 MG PO TABS: 25.0000 mg | ORAL_TABLET | Freq: Two times a day (BID) | ORAL | Status: DC

## 2013-02-03 MED ORDER — SERTRALINE HCL 50 MG PO TABS: 50.0000 mg | ORAL_TABLET | Freq: Every day | ORAL | Status: DC

## 2013-02-03 MED ORDER — NITROFURANTOIN MONOHYD MACRO 100 MG PO CAPS: 100.0000 mg | ORAL_CAPSULE | Freq: Every day | ORAL | Status: DC

## 2013-02-03 MED ORDER — SERTRALINE HCL 100 MG PO TABS: 100.0000 mg | ORAL_TABLET | Freq: Every day | ORAL | Status: DC

## 2013-02-03 NOTE — Patient Instructions (Signed)
Use wheelchair out in community.  Use walker at home.  I will prescribe therapy for her.    Continue meds unchanged at this time.

## 2013-02-03 NOTE — Progress Notes (Signed)
Patient ID: Charlotte Kelly, female   DOB: 09-08-1929, 79 y.o.   MRN: [id number] Location:  [facility]  Code Status: DNR   No Known Allergies  Chief Complaint  Patient presents with  . Hospitalization Follow-up    concussion on rt side    HPI: Patient is a 79 y.o. white female with advanced dementia seen in the office today for f/u after a hospitalization.  She was seen in the ED after hitting herself on the right side of her head with a door.  She has been ok since the trauma to her right temple.  Had a large goose egg that since resolved.  Now just has a small bruise in the area.  She did not lose consciousness.     Still has her combative days, but not like they used to be.  Balance is quite poor.   Daughter is going to bring her a walker.   Will order some PT at [facility]  Review of Systems:  Review of Systems  Constitutional: Positive for weight loss. Negative for fever, chills and malaise/fatigue.  Eyes: Negative for blurred vision.  Respiratory: Negative for cough and shortness of breath.   Cardiovascular: Negative for chest pain.  Gastrointestinal: Negative for abdominal pain, diarrhea and constipation.  Genitourinary: Negative for dysuria.  Musculoskeletal: Positive for falls.       Gait is getting worse and leans forward too early when reaching for items like her chair to sit down and sometimes topples over  Skin: Negative for rash.  Neurological: Negative for dizziness, loss of consciousness, weakness and headaches.  Endo/Heme/Allergies: Bruises/bleeds easily.  Psychiatric/Behavioral: Positive for memory loss. Negative for depression.       Sleeping well recently and getting up at a normal hour in the am     Past Medical History  Diagnosis Date  . Arthritis   . Depression   . Cataract   . Dementia in conditions classified elsewhere with behavioral disturbance(294.11)   . Anxiety   . Insomnia   . Insomnia, unspecified   .  Other alteration of consciousness   . Hypertension   . Encounter for screening fecal occult blood testing 09/19/2012  . Alzheimer disease     Past Surgical History  Procedure Laterality Date  . Gallblader    . Small intestine surgery    . Cholecystectomy      Social History:   reports that she has quit smoking. She does not have any smokeless tobacco history on file. She reports that she does not drink alcohol or use illicit drugs.  Family History  Problem Relation Age of Onset  . Heart failure Father     Medications: Patient's Medications  New Prescriptions   No medications on file  Previous Medications   ASPIRIN 81 MG TABLET    Take 81 mg by mouth daily.    B COMPLEX-C (SUPER B COMPLEX PO)    Take 1 tablet by mouth daily.    CHOLECALCIFEROL (VITAMIN D) 2000 UNITS TABLET    Take 2,000 Units by mouth daily.    CRANBERRY 250 MG CAPS    Take 1 capsule by mouth daily. daily to prevent UTI   DIVALPROEX (DEPAKOTE SPRINKLES) 125 MG CAPSULE    Take 2 capsules (250 mg total) by mouth 3 (three) times daily.   DOCUSATE SODIUM (COLACE) 100 MG CAPSULE    Take 100 mg by mouth daily.    DONEPEZIL (ARICEPT) 10 MG TABLET  Take 10 mg by mouth at bedtime.   FISH OIL-OMEGA-3 FATTY ACIDS 1000 MG CAPSULE    Take 2 g by mouth daily.   LISINOPRIL (PRINIVIL,ZESTRIL) 10 MG TABLET    Take 1 tablet (10 mg total) by mouth daily.   LORAZEPAM (ATIVAN) 0.5 MG TABLET    Take 0.5 mg by mouth every 8 (eight) hours as needed. Take one tablet every 8 hours as needed for anxiety.   MEMANTINE (NAMENDA) 10 MG TABLET    Take 10 mg by mouth 2 (two) times daily.   MULTIVITAMIN-IRON-MINERALS-FOLIC ACID (CENTRUM) CHEWABLE TABLET    Chew 1 tablet by mouth daily. Take one tablet once daily for supplement   NITROFURANTOIN, MACROCRYSTAL-MONOHYDRATE, (MACROBID) 100 MG CAPSULE    Take 1 capsule (100 mg total) by mouth at bedtime.   QUETIAPINE (SEROQUEL) 25 MG TABLET    Take 12.5-37.5 mg by mouth 2 (two) times daily. 1/2  tablet mid morning and 1 and 1/2 at bedtime for behaviors and sleep   SERTRALINE (ZOLOFT) 100 MG TABLET    Take 100 mg by mouth daily. Take daily along with 50mg  tab   SERTRALINE (ZOLOFT) 50 MG TABLET    Take 50 mg by mouth daily. Take daily along with 100mg  tab   TROSPIUM (SANCTURA) 20 MG TABLET    Take 20 mg by mouth 2 (two) times daily.   VITAMIN B-12 (CYANOCOBALAMIN) 500 MCG TABLET    Take 500 mcg by mouth daily.   Modified Medications   No medications on file  Discontinued Medications   No medications on file     Physical Exam: Filed Vitals:   02/03/13 1655  BP: 130/70  Pulse: 61  Temp: 97.4 F (36.3 C)  TempSrc: Oral  Resp: 18  Weight: 132 lb (59.875 kg)  SpO2: 97%  Physical Exam  Constitutional:  Frail white female, sits in chair in exam room and talks or sings, speech does not make sense the majority of the visit  HENT:  Head: Normocephalic.  Right Ear: External ear normal.  Left Ear: External ear normal.  Nose: Nose normal.  Mouth/Throat: Oropharynx is clear and moist.  Ecchymotic area on right eyebrow and temple region  Eyes: EOM are normal. Pupils are equal, round, and reactive to light.  Neck: Normal range of motion. Neck supple.  Cardiovascular: Normal rate, regular rhythm, normal heart sounds and intact distal pulses.   Pulmonary/Chest: Effort normal and breath sounds normal. No respiratory distress.  Abdominal: Soft. Bowel sounds are normal. She exhibits no distension.  Musculoskeletal: Normal range of motion.  Shuffling gait, then leans forward as reaches destination  Neurological: She is alert.  Can follow commands, but does better when given by her regular aide than anyone else;  Oriented to self only  Skin: Skin is warm and dry.    Labs reviewed: Basic Metabolic Panel:  Recent Labs  40/98/11 0948 11/06/12 1425 12/11/12 1409 12/19/12 1210  NA 144 144 137 142  K 4.8 4.1 4.4 4.6  CL 103 104 98 101  CO2 25 20 22 26   GLUCOSE 137* 147* 194* 165*   BUN 17 22 19 26   CREATININE 0.84 0.51* 0.61 0.87  CALCIUM 9.3 9.0 8.9 9.2  TSH 3.930  --   --   --    Liver Function Tests:  Recent Labs  09/17/12 0948 11/06/12 1425 12/19/12 1210  AST 16 13 19   ALT 17 12 16   ALKPHOS 107 92 85  BILITOT 0.3 0.2 0.3  PROT 7.2  6.8 6.8  CBC:  Recent Labs  09/17/12 0948 11/06/12 1425 12/19/12 1210  WBC 7.0 8.2 8.8  NEUTROABS 4.6 5.0 6.1  HGB 12.9 11.9 12.2  HCT 40.3 36.1 38.1  MCV 93 90 91  PLT  --   --  190   Lipid Panel:  Recent Labs  09/17/12 0948 10/01/12 0959  HDL 37* 40  LDLCALC 149* 115*  TRIG 236* 175*  CHOLHDL 6.3* 4.8*   Assessment/Plan 1. Recurrent UTIs -continue this and cranberry tablets, encourage water, no active s/s of UTI, just progression of her dementia - nitrofurantoin, macrocrystal-monohydrate, (MACROBID) 100 MG capsule; Take 1 capsule (100 mg total) by mouth at bedtime.  Dispense: 30 capsule; Refill: 4  2. Unspecified constipation -stable at this time--cont senokot - senna (SENOKOT) 8.6 MG TABS tablet; Take 1 tablet (8.6 mg total) by mouth daily.  Dispense: 30 each; Refill: 0  3. Dementia in conditions classified elsewhere with behavioral disturbance(294.11) -is progressing--explained to caregiver and husband that increased difficulty with her gait is a sign of progression of her dementia despite our best efforts with medications  -ordered PT at the facility to work with her and help her to use a walker to keep her balanced and prevent her from leaning forward and falling  4. Panic attacks -has not tolerated elimination of lorazepam previously, cont this for these episodes of anxiety and combativeness  Next appt:  Keep as scheduled

## 2013-02-04 ENCOUNTER — Other Ambulatory Visit: Payer: Self-pay | Admitting: Internal Medicine

## 2013-02-07 ENCOUNTER — Encounter (HOSPITAL_COMMUNITY): Payer: Self-pay | Admitting: Emergency Medicine

## 2013-02-07 ENCOUNTER — Telehealth: Payer: Self-pay

## 2013-02-07 ENCOUNTER — Emergency Department (INDEPENDENT_AMBULATORY_CARE_PROVIDER_SITE_OTHER)
Admission: EM | Admit: 2013-02-07 | Discharge: 2013-02-07 | Disposition: A | Discharge status: Home / Self Care | Payer: Medicare Other | Source: Home / Self Care

## 2013-02-07 DIAGNOSIS — R109 Unspecified abdominal pain: Secondary | ICD-10-CM

## 2013-02-07 NOTE — ED Provider Notes (Signed)
Charlotte Kelly is a 79 y.o. female who presents to Urgent Care today for right-sided abdominal pain. Patient has moderate to severe dementia. Her caregivers noted a few episodes of wincing today. She had some reaction to pain with palpation of her right abdomen. This happened this afternoon. However it has since passed. She no longer appears to be reacting to pain and is in her normal state of health. She is laughing and eating and drinking well and voiding and stooling well. She denies any pain however she often does even when she is in pain. She is otherwise well according to her caregiver.    PMH reviewed. Moderate to severe dementia History  Substance Use Topics  . Smoking status: Former Games developer  . Smokeless tobacco: Not on file  . Alcohol Use: No   ROS as above Medications reviewed. No current facility-administered medications for this encounter.   Current Outpatient Prescriptions  Medication Sig Dispense Refill  . aspirin 81 MG tablet Take 81 mg by mouth daily.       . Cholecalciferol (VITAMIN D) 2000 UNITS tablet Take 2,000 Units by mouth daily.       . divalproex (DEPAKOTE SPRINKLES) 125 MG capsule Take 2 capsules (250 mg total) by mouth 3 (three) times daily.  90 capsule  0  . donepezil (ARICEPT) 10 MG tablet Take 1 tablet (10 mg total) by mouth at bedtime.  30 tablet  5  . donepezil (ARICEPT) 10 MG tablet TAKE 1 TABLET BY MOUTH EVERY DAY FOR MEMORY  30 tablet  0  . lisinopril (PRINIVIL,ZESTRIL) 10 MG tablet Take 1 tablet (10 mg total) by mouth daily.  90 tablet  3  . LORazepam (ATIVAN) 0.5 MG tablet Take 0.5 mg by mouth every 8 (eight) hours as needed. Take one tablet every 8 hours as needed for anxiety.      . memantine (NAMENDA) 10 MG tablet Take 1 tablet (10 mg total) by mouth 2 (two) times daily.  60 tablet  5  . multivitamin-iron-minerals-folic acid (CENTRUM) chewable tablet Chew 1 tablet by mouth daily. Take one tablet once daily for supplement      . nitrofurantoin,  macrocrystal-monohydrate, (MACROBID) 100 MG capsule Take 1 capsule (100 mg total) by mouth at bedtime.  30 capsule  4  . QUEtiapine (SEROQUEL) 25 MG tablet Take 1 tablet (25 mg total) by mouth 2 (two) times daily. 1/2 tablet mid morning and 1 and 1/2 at bedtime for behaviors and sleep  60 tablet  5  . senna (SENOKOT) 8.6 MG TABS tablet Take 1 tablet (8.6 mg total) by mouth daily.  30 each  0  . sertraline (ZOLOFT) 100 MG tablet Take 1 tablet (100 mg total) by mouth daily. Take daily along with 50mg  tab  30 tablet  5  . sertraline (ZOLOFT) 50 MG tablet Take 1 tablet (50 mg total) by mouth daily. Take daily along with 100mg  tab  30 tablet  5  . trospium (SANCTURA) 20 MG tablet Take 20 mg by mouth 2 (two) times daily.        Exam:  BP 128/62  Pulse 68  Temp(Src) 97.6 F (36.4 C) (Oral)  Resp 20  SpO2 97% Gen: Well NAD, nontoxic appearing in a wheelchair HEENT: EOMI,  MMM Lungs: CTABL Nl WOB Heart: RRR no MRG Abd: NABS, NT, ND Exts: Non edematous BL  LE, warm and well perfused.   No results found for this or any previous visit (from the past 24 hour(s)). No results found.  Assessment and Plan: 79 y.o. female with likely gas pain. She certainly does not have any pain on palpation of her abdomen currently. She appears to be well.  We discussed the plan with her husband and her caregivers and they expressed understanding and agreement.  Plan for watchful waiting. If her symptoms return or worsen she will present back to clinic for further evaluation and management.  We discussed the plan to not obtain a urinalysis as she has urinary columnization and frequent UTIs in her symptoms or not currently consistent with UTI.   Will f/u prn Discussed warning signs or symptoms. Please see discharge instructions. Patient expresses understanding.    Rodolph Bong, MD 02/07/13 [phone]

## 2013-02-07 NOTE — ED Notes (Signed)
Right side abdominal pain.  Noted this afternoon.  Caregiver reports patient winced with pain.  No injury.  Urine darker the last few weeks and caregiver pushing liquids.  No cough or cold.

## 2013-02-07 NOTE — Telephone Encounter (Signed)
Patient's caregiver called c/o right lower back that onset at 4:00pm. The area is tender to the touch and constant, patient with no known injury, patient without fever. Patient is prone to UTI's although she is currently on a preventative- marcobid. Caregiver was advised to take patient to Urgent Care for further evaluation to r/o more serious concerns, caregiver agreed to take patient.

## 2013-02-09 ENCOUNTER — Other Ambulatory Visit: Payer: Self-pay | Admitting: Nurse Practitioner

## 2013-02-14 DIAGNOSIS — R269 Unspecified abnormalities of gait and mobility: Secondary | ICD-10-CM | POA: Diagnosis not present

## 2013-02-14 DIAGNOSIS — M6281 Muscle weakness (generalized): Secondary | ICD-10-CM | POA: Diagnosis not present

## 2013-02-14 DIAGNOSIS — R279 Unspecified lack of coordination: Secondary | ICD-10-CM | POA: Diagnosis not present

## 2013-02-17 DIAGNOSIS — R269 Unspecified abnormalities of gait and mobility: Secondary | ICD-10-CM | POA: Diagnosis not present

## 2013-02-17 DIAGNOSIS — R279 Unspecified lack of coordination: Secondary | ICD-10-CM | POA: Diagnosis not present

## 2013-02-17 DIAGNOSIS — M6281 Muscle weakness (generalized): Secondary | ICD-10-CM | POA: Diagnosis not present

## 2013-02-19 DIAGNOSIS — M6281 Muscle weakness (generalized): Secondary | ICD-10-CM | POA: Diagnosis not present

## 2013-02-19 DIAGNOSIS — R279 Unspecified lack of coordination: Secondary | ICD-10-CM | POA: Diagnosis not present

## 2013-02-19 DIAGNOSIS — R269 Unspecified abnormalities of gait and mobility: Secondary | ICD-10-CM | POA: Diagnosis not present

## 2013-02-20 DIAGNOSIS — R269 Unspecified abnormalities of gait and mobility: Secondary | ICD-10-CM | POA: Diagnosis not present

## 2013-02-20 DIAGNOSIS — R279 Unspecified lack of coordination: Secondary | ICD-10-CM | POA: Diagnosis not present

## 2013-02-20 DIAGNOSIS — M6281 Muscle weakness (generalized): Secondary | ICD-10-CM | POA: Diagnosis not present

## 2013-02-21 DIAGNOSIS — R279 Unspecified lack of coordination: Secondary | ICD-10-CM | POA: Diagnosis not present

## 2013-02-21 DIAGNOSIS — M6281 Muscle weakness (generalized): Secondary | ICD-10-CM | POA: Diagnosis not present

## 2013-02-21 DIAGNOSIS — R269 Unspecified abnormalities of gait and mobility: Secondary | ICD-10-CM | POA: Diagnosis not present

## 2013-02-24 DIAGNOSIS — M6281 Muscle weakness (generalized): Secondary | ICD-10-CM | POA: Diagnosis not present

## 2013-02-24 DIAGNOSIS — R279 Unspecified lack of coordination: Secondary | ICD-10-CM | POA: Diagnosis not present

## 2013-02-24 DIAGNOSIS — R269 Unspecified abnormalities of gait and mobility: Secondary | ICD-10-CM | POA: Diagnosis not present

## 2013-02-25 ENCOUNTER — Ambulatory Visit: Payer: Medicare Other | Admitting: Neurology

## 2013-02-27 DIAGNOSIS — R269 Unspecified abnormalities of gait and mobility: Secondary | ICD-10-CM | POA: Diagnosis not present

## 2013-02-27 DIAGNOSIS — R279 Unspecified lack of coordination: Secondary | ICD-10-CM | POA: Diagnosis not present

## 2013-02-27 DIAGNOSIS — M6281 Muscle weakness (generalized): Secondary | ICD-10-CM | POA: Diagnosis not present

## 2013-03-03 DIAGNOSIS — N390 Urinary tract infection, site not specified: Secondary | ICD-10-CM | POA: Diagnosis not present

## 2013-03-03 DIAGNOSIS — N3941 Urge incontinence: Secondary | ICD-10-CM | POA: Diagnosis not present

## 2013-03-04 DIAGNOSIS — M6281 Muscle weakness (generalized): Secondary | ICD-10-CM | POA: Diagnosis not present

## 2013-03-04 DIAGNOSIS — R279 Unspecified lack of coordination: Secondary | ICD-10-CM | POA: Diagnosis not present

## 2013-03-04 DIAGNOSIS — R269 Unspecified abnormalities of gait and mobility: Secondary | ICD-10-CM | POA: Diagnosis not present

## 2013-03-06 DIAGNOSIS — R279 Unspecified lack of coordination: Secondary | ICD-10-CM | POA: Diagnosis not present

## 2013-03-06 DIAGNOSIS — M6281 Muscle weakness (generalized): Secondary | ICD-10-CM | POA: Diagnosis not present

## 2013-03-06 DIAGNOSIS — R269 Unspecified abnormalities of gait and mobility: Secondary | ICD-10-CM | POA: Diagnosis not present

## 2013-03-10 ENCOUNTER — Other Ambulatory Visit: Payer: Self-pay | Admitting: *Deleted

## 2013-03-10 ENCOUNTER — Ambulatory Visit: Payer: Medicare Other | Admitting: Neurology

## 2013-03-11 DIAGNOSIS — M6281 Muscle weakness (generalized): Secondary | ICD-10-CM | POA: Diagnosis not present

## 2013-03-11 DIAGNOSIS — R269 Unspecified abnormalities of gait and mobility: Secondary | ICD-10-CM | POA: Diagnosis not present

## 2013-03-11 DIAGNOSIS — R279 Unspecified lack of coordination: Secondary | ICD-10-CM | POA: Diagnosis not present

## 2013-03-12 ENCOUNTER — Other Ambulatory Visit: Payer: Self-pay | Admitting: *Deleted

## 2013-03-12 DIAGNOSIS — R279 Unspecified lack of coordination: Secondary | ICD-10-CM | POA: Diagnosis not present

## 2013-03-12 DIAGNOSIS — M6281 Muscle weakness (generalized): Secondary | ICD-10-CM | POA: Diagnosis not present

## 2013-03-12 DIAGNOSIS — R269 Unspecified abnormalities of gait and mobility: Secondary | ICD-10-CM | POA: Diagnosis not present

## 2013-03-17 ENCOUNTER — Other Ambulatory Visit: Payer: Medicare Other

## 2013-03-18 ENCOUNTER — Other Ambulatory Visit: Payer: Medicare Other

## 2013-03-18 DIAGNOSIS — E785 Hyperlipidemia, unspecified: Secondary | ICD-10-CM | POA: Diagnosis not present

## 2013-03-18 DIAGNOSIS — R7309 Other abnormal glucose: Secondary | ICD-10-CM | POA: Diagnosis not present

## 2013-03-18 DIAGNOSIS — R739 Hyperglycemia, unspecified: Secondary | ICD-10-CM

## 2013-03-19 ENCOUNTER — Ambulatory Visit (INDEPENDENT_AMBULATORY_CARE_PROVIDER_SITE_OTHER): Payer: Medicare Other | Admitting: Nurse Practitioner

## 2013-03-19 ENCOUNTER — Encounter: Payer: Self-pay | Admitting: Nurse Practitioner

## 2013-03-19 VITALS — BP 122/74 | HR 76 | Temp 98.1°F | Wt 132.0 lb

## 2013-03-19 DIAGNOSIS — Z23 Encounter for immunization: Secondary | ICD-10-CM

## 2013-03-19 DIAGNOSIS — F0281 Dementia in other diseases classified elsewhere with behavioral disturbance: Secondary | ICD-10-CM

## 2013-03-19 DIAGNOSIS — E785 Hyperlipidemia, unspecified: Secondary | ICD-10-CM | POA: Diagnosis not present

## 2013-03-19 DIAGNOSIS — F411 Generalized anxiety disorder: Secondary | ICD-10-CM

## 2013-03-19 DIAGNOSIS — I10 Essential (primary) hypertension: Secondary | ICD-10-CM

## 2013-03-19 DIAGNOSIS — N390 Urinary tract infection, site not specified: Secondary | ICD-10-CM

## 2013-03-19 DIAGNOSIS — F419 Anxiety disorder, unspecified: Secondary | ICD-10-CM

## 2013-03-19 LAB — LIPID PANEL: Chol/HDL Ratio: 4.4 ratio units (ref 0.0–4.4)

## 2013-03-19 LAB — HEMOGLOBIN A1C
Est. average glucose Bld gHb Est-mCnc: 123 mg/dL
Hgb A1c MFr Bld: 5.9 % — ABNORMAL HIGH (ref 4.8–5.6)

## 2013-03-19 MED ORDER — SIMVASTATIN 10 MG PO TABS: 10.0000 mg | ORAL_TABLET | Freq: Every day | ORAL | Status: DC

## 2013-03-19 NOTE — Patient Instructions (Signed)
Will start simvastatin back at 10 mg for cholesterol   Follow up in 4 months

## 2013-03-19 NOTE — Progress Notes (Signed)
Patient ID: Charlotte Kelly, female   DOB: Aug 09, 1929, 79 y.o.   MRN: [id number]   No Known Allergies  Chief Complaint  Patient presents with  . Medical Managment of Chronic Issues    3 month f/u, labs printed    HPI: Patient is a 79 y.o. female seen in the office today for routine follow up Not doing well with PT; pts aide is working with pt every hour to do exercises since she is not as mobile and not walking to prevent pressure and preserve strength and mobility. Still able to feed her self and drink; needing assistance for bathing and dressing.  Behaviors have been stable and more consistent.  Overall doing well and husband and caregiver without concerns.  Labs reviewed; pt off simvastatin- unsure of why (family had taken her off and she had labs to follow up cholesterol)  Review of Systems:  Review of Systems  Constitutional: Negative for fever, chills and malaise/fatigue.  Respiratory: Negative for cough and shortness of breath.   Cardiovascular: Negative for chest pain and leg swelling.  Gastrointestinal: Negative for nausea, vomiting, abdominal pain, diarrhea and constipation.  Genitourinary: Negative for dysuria, urgency and frequency.       Following with urology; checked for UTI- urine was negative.   Musculoskeletal: Negative for joint pain and myalgias.  Skin: Negative.   Neurological: Positive for weakness.  Psychiatric/Behavioral: The patient does not have insomnia.      Past Medical History  Diagnosis Date  . Arthritis   . Depression   . Cataract   . Dementia in conditions classified elsewhere with behavioral disturbance(294.11)   . Anxiety   . Insomnia   . Insomnia, unspecified   . Other alteration of consciousness   . Hypertension   . Encounter for screening fecal occult blood testing 09/19/2012  . Alzheimer disease    Past Surgical History  Procedure Laterality Date  . Gallblader    . Small intestine surgery    . Cholecystectomy     Social History:  reports that she has quit smoking. She does not have any smokeless tobacco history on file. She reports that she does not drink alcohol or use illicit drugs.  Family History  Problem Relation Age of Onset  . Heart failure Father     Medications: Patient's Medications  New Prescriptions   No medications on file  Previous Medications   ASPIRIN 81 MG TABLET    Take 81 mg by mouth daily.    CHOLECALCIFEROL (VITAMIN D) 2000 UNITS TABLET    Take 2,000 Units by mouth daily.    DIVALPROEX (DEPAKOTE SPRINKLES) 125 MG CAPSULE    Take 2 capsules (250 mg total) by mouth 3 (three) times daily.   DONEPEZIL (ARICEPT) 10 MG TABLET    Take 1 tablet (10 mg total) by mouth at bedtime.   DONEPEZIL (ARICEPT) 10 MG TABLET    TAKE 1 TABLET BY MOUTH EVERY DAY FOR MEMORY   LISINOPRIL (PRINIVIL,ZESTRIL) 10 MG TABLET    Take 1 tablet (10 mg total) by mouth daily.   LORAZEPAM (ATIVAN) 0.5 MG TABLET    Take 0.5 mg by mouth every 8 (eight) hours as needed. Take one tablet every 8 hours as needed for anxiety.   MEMANTINE (NAMENDA) 10 MG TABLET    Take 1 tablet (10 mg total) by mouth 2 (two) times daily.   MULTIVITAMIN-IRON-MINERALS-FOLIC ACID (CENTRUM) CHEWABLE TABLET    Chew 1 tablet by mouth daily. Take one tablet once daily for supplement  NITROFURANTOIN, MACROCRYSTAL-MONOHYDRATE, (MACROBID) 100 MG CAPSULE    Take 1 capsule (100 mg total) by mouth at bedtime.   NITROFURANTOIN, MACROCRYSTAL-MONOHYDRATE, (MACROBID) 100 MG CAPSULE    TAKE 1 CAPSULE BY MOUTH AT BEDTIME   QUETIAPINE (SEROQUEL) 25 MG TABLET    Take 1 tablet (25 mg total) by mouth 2 (two) times daily. 1/2 tablet mid morning and 1 and 1/2 at bedtime for behaviors and sleep   SENNA (SENOKOT) 8.6 MG TABS TABLET    Take 1 tablet (8.6 mg total) by mouth daily.   SERTRALINE (ZOLOFT) 100 MG TABLET    Take 1 tablet (100 mg total) by mouth daily. Take daily along with 50mg  tab   SERTRALINE (ZOLOFT) 50 MG TABLET    Take 1 tablet (50 mg total) by mouth daily. Take  daily along with 100mg  tab   TROSPIUM (SANCTURA) 20 MG TABLET    Take 20 mg by mouth 2 (two) times daily.  Modified Medications   No medications on file  Discontinued Medications   No medications on file     Physical Exam:  Filed Vitals:   03/19/13 1426  BP: 122/74  Pulse: 76  Temp: 98.1 F (36.7 C)  TempSrc: Oral  Weight: 132 lb (59.875 kg)  SpO2: 95%    Physical Exam  Vitals reviewed. Constitutional: She is well-developed, well-nourished, and in no distress. No distress.  HENT:  Head: Normocephalic and atraumatic.  Nose: Nose normal.  Mouth/Throat: Oropharynx is clear and moist. No oropharyngeal exudate.  Eyes: Conjunctivae and EOM are normal. Pupils are equal, round, and reactive to light.  Neck: Normal range of motion. Neck supple. No thyromegaly present.  Cardiovascular: Normal rate, regular rhythm and normal heart sounds.   Pulmonary/Chest: Effort normal and breath sounds normal. No respiratory distress.  Abdominal: Soft. Bowel sounds are normal. She exhibits no distension.  Musculoskeletal: She exhibits no edema and no tenderness.  Lymphadenopathy:    She has no cervical adenopathy.  Neurological: She is alert.  Skin: Skin is warm and dry. She is not diaphoretic.     Labs reviewed: Basic Metabolic Panel:  Recent Labs  16/10/96 0948 11/06/12 1425 12/11/12 1409 12/19/12 1210  NA 144 144 137 142  K 4.8 4.1 4.4 4.6  CL 103 104 98 101  CO2 25 20 22 26   GLUCOSE 137* 147* 194* 165*  BUN 17 22 19 26   CREATININE 0.84 0.51* 0.61 0.87  CALCIUM 9.3 9.0 8.9 9.2  TSH 3.930  --   --   --    Liver Function Tests:  Recent Labs  09/17/12 0948 11/06/12 1425 12/19/12 1210  AST 16 13 19   ALT 17 12 16   ALKPHOS 107 92 85  BILITOT 0.3 0.2 0.3  PROT 7.2 6.8 6.8   No results found for this basename: LIPASE, AMYLASE,  in the last 8760 hours No results found for this basename: AMMONIA,  in the last 8760 hours CBC:  Recent Labs  09/17/12 0948 11/06/12 1425  12/19/12 1210  WBC 7.0 8.2 8.8  NEUTROABS 4.6 5.0 6.1  HGB 12.9 11.9 12.2  HCT 40.3 36.1 38.1  MCV 93 90 91  PLT  --   --  190   Lipid Panel:  Recent Labs  09/17/12 0948 10/01/12 0959 03/18/13 0933  HDL 37* 40 47  LDLCALC 149* 115* 114*  TRIG 236* 175* 227*  CHOLHDL 6.3* 4.8* 4.4     Assessment/Plan 1. Need for prophylactic vaccination and inoculation against influenza Vaccine given  2.  Other and unspecified hyperlipidemia - simvastatin (ZOCOR) 10 MG tablet; Take 1 tablet (10 mg total) by mouth at bedtime.  Dispense: 90 tablet; Refill: 3  3. Dementia in conditions classified elsewhere with behavioral disturbance(294.11) -stable on current medications  4. Recurrent UTI -no further symptoms -cont macrobid  5. Anxiety -stable on current medications, unable to stop ativan  6. HTN (hypertension) Patient is stable; continue current regimen. Will monitor and make changes as necessary.  Will have pt follow up in 4 months and as needed before then.

## 2013-03-20 ENCOUNTER — Encounter (HOSPITAL_COMMUNITY): Payer: Self-pay | Admitting: Emergency Medicine

## 2013-03-20 ENCOUNTER — Emergency Department (HOSPITAL_COMMUNITY)
Admission: EM | Admit: 2013-03-20 | Discharge: 2013-03-20 | Disposition: A | Discharge status: Home / Self Care | Payer: Medicare Other | Attending: Emergency Medicine | Admitting: Emergency Medicine

## 2013-03-20 ENCOUNTER — Emergency Department (HOSPITAL_COMMUNITY): Payer: Medicare Other

## 2013-03-20 DIAGNOSIS — F0280 Dementia in other diseases classified elsewhere without behavioral disturbance: Secondary | ICD-10-CM | POA: Insufficient documentation

## 2013-03-20 DIAGNOSIS — F02818 Dementia in other diseases classified elsewhere, unspecified severity, with other behavioral disturbance: Secondary | ICD-10-CM | POA: Insufficient documentation

## 2013-03-20 DIAGNOSIS — I10 Essential (primary) hypertension: Secondary | ICD-10-CM | POA: Insufficient documentation

## 2013-03-20 DIAGNOSIS — Y92009 Unspecified place in unspecified non-institutional (private) residence as the place of occurrence of the external cause: Secondary | ICD-10-CM | POA: Insufficient documentation

## 2013-03-20 DIAGNOSIS — F0281 Dementia in other diseases classified elsewhere with behavioral disturbance: Secondary | ICD-10-CM | POA: Insufficient documentation

## 2013-03-20 DIAGNOSIS — W07XXXA Fall from chair, initial encounter: Secondary | ICD-10-CM | POA: Insufficient documentation

## 2013-03-20 DIAGNOSIS — Z79899 Other long term (current) drug therapy: Secondary | ICD-10-CM | POA: Diagnosis not present

## 2013-03-20 DIAGNOSIS — G4700 Insomnia, unspecified: Secondary | ICD-10-CM | POA: Insufficient documentation

## 2013-03-20 DIAGNOSIS — S0990XA Unspecified injury of head, initial encounter: Secondary | ICD-10-CM | POA: Insufficient documentation

## 2013-03-20 DIAGNOSIS — Y939 Activity, unspecified: Secondary | ICD-10-CM | POA: Insufficient documentation

## 2013-03-20 DIAGNOSIS — F329 Major depressive disorder, single episode, unspecified: Secondary | ICD-10-CM | POA: Diagnosis not present

## 2013-03-20 DIAGNOSIS — Z043 Encounter for examination and observation following other accident: Secondary | ICD-10-CM | POA: Diagnosis not present

## 2013-03-20 DIAGNOSIS — F411 Generalized anxiety disorder: Secondary | ICD-10-CM | POA: Insufficient documentation

## 2013-03-20 DIAGNOSIS — M129 Arthropathy, unspecified: Secondary | ICD-10-CM | POA: Diagnosis not present

## 2013-03-20 DIAGNOSIS — Z8669 Personal history of other diseases of the nervous system and sense organs: Secondary | ICD-10-CM | POA: Insufficient documentation

## 2013-03-20 DIAGNOSIS — Z7982 Long term (current) use of aspirin: Secondary | ICD-10-CM | POA: Diagnosis not present

## 2013-03-20 DIAGNOSIS — W19XXXA Unspecified fall, initial encounter: Secondary | ICD-10-CM

## 2013-03-20 DIAGNOSIS — F3289 Other specified depressive episodes: Secondary | ICD-10-CM | POA: Insufficient documentation

## 2013-03-20 DIAGNOSIS — Z87891 Personal history of nicotine dependence: Secondary | ICD-10-CM | POA: Insufficient documentation

## 2013-03-20 DIAGNOSIS — S0993XA Unspecified injury of face, initial encounter: Secondary | ICD-10-CM | POA: Diagnosis not present

## 2013-03-20 DIAGNOSIS — G309 Alzheimer's disease, unspecified: Secondary | ICD-10-CM | POA: Insufficient documentation

## 2013-03-20 MED ORDER — ACETAMINOPHEN 500 MG PO TABS
1000.0000 mg | ORAL_TABLET | Freq: Once | ORAL | Status: AC
  Administered 2013-03-20: 1000 mg via ORAL
  Filled 2013-03-20: qty 2

## 2013-03-20 NOTE — ED Provider Notes (Signed)
Medical screening examination/treatment/procedure(s) were conducted as a shared visit with non-physician practitioner(s) and myself.  I personally evaluated the patient during the encounter  Patient seen by me. Patient from assisted living status post fall fell out of the wheelchair hitting head on the end table. No loss of consciousness. Patient is demented at baseline. Patient here with caregiver and spouse head CT shows no acute injuries CT of the cervical spine raises some concern for a possible endplate injury. Radiology is recommending outpatient MRI for further evaluation. Patient seems to be not bothered by it at all here clinically care followed by [facility] will be given the information about the CT and judgment with them and the family can determine whether outpatient MRI is needed. Patient in no acute distress nontoxic.  Results for orders placed in visit on 03/18/13  HEMOGLOBIN A1C      Result Value Range   Hemoglobin A1C 5.9 (*) 4.8 - 5.6 %   Estimated average glucose 123    LIPID PANEL      Result Value Range   Cholesterol, Total 206 (*) 100 - 199 mg/dL   Triglycerides 010 (*) 0 - 149 mg/dL   HDL 47  >27 mg/dL   VLDL Cholesterol Cal 45 (*) 5 - 40 mg/dL   LDL Calculated 253 (*) 0 - 99 mg/dL   Chol/HDL Ratio 4.4  0.0 - 4.4 ratio units   Ct Head Wo Contrast  03/20/2013   CLINICAL DATA:  Fall from wheelchair. Head trauma.  EXAM: CT HEAD WITHOUT CONTRAST  CT CERVICAL SPINE WITHOUT CONTRAST  TECHNIQUE: Multidetector CT imaging of the head and cervical spine was performed following the standard protocol without intravenous contrast. Multiplanar CT image reconstructions of the cervical spine were also generated.  COMPARISON:  01/30/2013 head CT.  Cervical spine CT 11/12/2012.  FINDINGS: CT HEAD FINDINGS  No mass lesion, mass effect, midline shift, hydrocephalus, hemorrhage. No acute territorial cortical ischemia/infarct. Atrophy and chronic ischemic white  matter disease is present.The calvarium is intact. Enlarged subarachnoid spaces are present over both frontal lobes. Intracranial atherosclerosis. Paranasal sinuses appear within normal limits.  CT CERVICAL SPINE FINDINGS  Motion artifact is present through C2 and there is also superimposed dental artifact. Allowing for these degrading technical factors, the cervical spinal alignment the is unchanged compared to the others study which was also motion degraded. Anterolisthesis of C3 on C4 and C4 on C5 is present which appears degenerative. Severe degenerative disk disease at C5-C6. There is a T2 superior endplate compression fracture which is age indeterminate and was not seen on the prior examination. There is no retropulsion and loss of height is about 10%. Upper thoracic spondylosis is present. The spinous processes are intact. Degenerative disk and facet disease is present. There is asymmetry at the atlantodental junction probably due to rotation. Incidental visualization of the lungs demonstrates bilateral pulmonary parenchymal apical scarring. Aortic arch and branch vessel atherosclerosis.  IMPRESSION: CT HEAD IMPRESSION  Atrophy and chronic ischemic white matter disease without acute intracranial abnormality.  CT CERVICAL SPINE IMPRESSION  No definite acute osseous abnormality in the cervical spine. Study degraded by motion and dental artifact. Age indeterminate T2 superior endplate pressure fracture may be chronic however this area was obscured by motion artifact on the prior exam. Nonemergent MRI if may be useful in followup to assess for marrow edema if clinically indicated. No retropulsion.   Electronically Signed   By: Andreas Newport M.D.   On: 03/20/2013 15:15  Ct Cervical Spine Wo Contrast  03/20/2013   CLINICAL DATA:  Fall from wheelchair. Head trauma.  EXAM: CT HEAD WITHOUT CONTRAST  CT CERVICAL SPINE WITHOUT CONTRAST  TECHNIQUE: Multidetector CT imaging of the head and cervical spine was  performed following the standard protocol without intravenous contrast. Multiplanar CT image reconstructions of the cervical spine were also generated.  COMPARISON:  01/30/2013 head CT.  Cervical spine CT 11/12/2012.  FINDINGS: CT HEAD FINDINGS  No mass lesion, mass effect, midline shift, hydrocephalus, hemorrhage. No acute territorial cortical ischemia/infarct. Atrophy and chronic ischemic white matter disease is present.The calvarium is intact. Enlarged subarachnoid spaces are present over both frontal lobes. Intracranial atherosclerosis. Paranasal sinuses appear within normal limits.  CT CERVICAL SPINE FINDINGS  Motion artifact is present through C2 and there is also superimposed dental artifact. Allowing for these degrading technical factors, the cervical spinal alignment the is unchanged compared to the others study which was also motion degraded. Anterolisthesis of C3 on C4 and C4 on C5 is present which appears degenerative. Severe degenerative disk disease at C5-C6. There is a T2 superior endplate compression fracture which is age indeterminate and was not seen on the prior examination. There is no retropulsion and loss of height is about 10%. Upper thoracic spondylosis is present. The spinous processes are intact. Degenerative disk and facet disease is present. There is asymmetry at the atlantodental junction probably due to rotation. Incidental visualization of the lungs demonstrates bilateral pulmonary parenchymal apical scarring. Aortic arch and branch vessel atherosclerosis.  IMPRESSION: CT HEAD IMPRESSION  Atrophy and chronic ischemic white matter disease without acute intracranial abnormality.  CT CERVICAL SPINE IMPRESSION  No definite acute osseous abnormality in the cervical spine. Study degraded by motion and dental artifact. Age indeterminate T2 superior endplate pressure fracture may be chronic however this area was obscured by motion artifact on the prior exam. Nonemergent MRI if may be useful in  followup to assess for marrow edema if clinically indicated. No retropulsion.   Electronically Signed   By: Andreas Newport M.D.   On: 03/20/2013 15:15      Shelda Jakes, MD 03/20/13 1540

## 2013-03-20 NOTE — ED Provider Notes (Signed)
CSN: [id number]     Arrival date & time 03/20/13  1239 History   First MD Initiated Contact with Patient 03/20/13 1245     Chief Complaint  Patient presents with  . Fall   (Consider location/radiation/quality/duration/timing/severity/associated sxs/prior Treatment) HPI Comments: Patient is an 79 year old female with history of dementia who presents today after sliding off her leather chair. This fall was witnessed by her caretaker. Her caretaker believes that she slipped because the leather and her pants made a very slippery surface. Another caretaker who was not in the room suggests that she was reaching for something on the nearby table. As she was slipping she hit the back right side of her head on the chair leg. The patient is currently denying any pain. There was no loss of consciousness. The patient is currently at her baseline per the family members. Pt is uncooperative.   The history is provided by the patient. No language interpreter was used.    Past Medical History  Diagnosis Date  . Arthritis   . Depression   . Cataract   . Dementia in conditions classified elsewhere with behavioral disturbance(294.11)   . Anxiety   . Insomnia   . Insomnia, unspecified   . Other alteration of consciousness   . Hypertension   . Encounter for screening fecal occult blood testing 09/19/2012  . Alzheimer disease    Past Surgical History  Procedure Laterality Date  . Gallblader    . Small intestine surgery    . Cholecystectomy     Family History  Problem Relation Age of Onset  . Heart failure Father    History  Substance Use Topics  . Smoking status: Former Games developer  . Smokeless tobacco: Not on file  . Alcohol Use: No   OB History   Grav Para Term Preterm Abortions TAB SAB Ect Mult Living                 Review of Systems  Constitutional: Negative for fever and chills.  Respiratory: Negative for shortness of breath.   Gastrointestinal: Negative for vomiting.  All other systems  reviewed and are negative.    Allergies  Review of patient's allergies indicates no known allergies.  Home Medications   Current Outpatient Rx  Name  Route  Sig  Dispense  Refill  . aspirin 81 MG tablet   Oral   Take 81 mg by mouth daily.          . Cholecalciferol (VITAMIN D) 2000 UNITS tablet   Oral   Take 2,000 Units by mouth daily.          . divalproex (DEPAKOTE SPRINKLES) 125 MG capsule   Oral   Take 2 capsules (250 mg total) by mouth 3 (three) times daily.   90 capsule   0   . donepezil (ARICEPT) 10 MG tablet   Oral   Take 1 tablet (10 mg total) by mouth at bedtime.   30 tablet   5   . lisinopril (PRINIVIL,ZESTRIL) 10 MG tablet   Oral   Take 1 tablet (10 mg total) by mouth daily.   90 tablet   3   . LORazepam (ATIVAN) 0.5 MG tablet   Oral   Take 0.5 mg by mouth every 8 (eight) hours as needed. Take one tablet every 8 hours as needed for anxiety.         . memantine (NAMENDA) 10 MG tablet   Oral   Take 1 tablet (10 mg total) by  mouth 2 (two) times daily.   60 tablet   5   . multivitamin-iron-minerals-folic acid (CENTRUM) chewable tablet   Oral   Chew 1 tablet by mouth daily. Take one tablet once daily for supplement         . nitrofurantoin, macrocrystal-monohydrate, (MACROBID) 100 MG capsule   Oral   Take 1 capsule (100 mg total) by mouth at bedtime.   30 capsule   4   . QUEtiapine (SEROQUEL) 25 MG tablet   Oral   Take 1 tablet (25 mg total) by mouth 2 (two) times daily. 1/2 tablet mid morning and 1 and 1/2 at bedtime for behaviors and sleep   60 tablet   5   . senna (SENOKOT) 8.6 MG TABS tablet   Oral   Take 1 tablet (8.6 mg total) by mouth daily.   30 each   0   . sertraline (ZOLOFT) 100 MG tablet   Oral   Take 1 tablet (100 mg total) by mouth daily. Take daily along with 50mg  tab   30 tablet   5   . sertraline (ZOLOFT) 50 MG tablet   Oral   Take 1 tablet (50 mg total) by mouth daily. Take daily along with 100mg  tab   30  tablet   5   . simvastatin (ZOCOR) 10 MG tablet   Oral   Take 1 tablet (10 mg total) by mouth at bedtime.   90 tablet   3   . trospium (SANCTURA) 20 MG tablet   Oral   Take 20 mg by mouth 2 (two) times daily.          BP 138/98  Pulse 66  Temp(Src) 97.5 F (36.4 C) (Oral)  Resp 19  SpO2 100% Physical Exam  Nursing note and vitals reviewed. Constitutional: She appears well-developed and well-nourished. She is uncooperative. No distress.  HENT:  Head: Normocephalic and atraumatic.  Right Ear: External ear normal.  Left Ear: External ear normal.  Nose: Nose normal.  Mouth/Throat: Oropharynx is clear and moist.  Eyes: Conjunctivae and EOM are normal. Pupils are equal, round, and reactive to light.  Visual tracking normal.   Neck: Normal range of motion.  Cardiovascular: Normal rate, regular rhythm, normal heart sounds, intact distal pulses and normal pulses.   Pulmonary/Chest: Effort normal and breath sounds normal. No stridor. No respiratory distress. She has no wheezes. She has no rales.  Abdominal: Soft. She exhibits no distension.  Musculoskeletal: Normal range of motion.  Neurological: She is alert.  Neuro exam was limited due to patient no cooperating. Family reports being uncooperative is pt's baseline. Able to move all 4 extremities without guarding.   Skin: Skin is warm and dry. She is not diaphoretic. No erythema.  Psychiatric: She has a normal mood and affect. Her behavior is normal.    ED Course  Procedures (including critical care time) Labs Review Labs Reviewed - No data to display Imaging Review Ct Head Wo Contrast  03/20/2013   CLINICAL DATA:  Fall from wheelchair. Head trauma.  EXAM: CT HEAD WITHOUT CONTRAST  CT CERVICAL SPINE WITHOUT CONTRAST  TECHNIQUE: Multidetector CT imaging of the head and cervical spine was performed following the standard protocol without intravenous contrast. Multiplanar CT image reconstructions of the cervical spine were also  generated.  COMPARISON:  01/30/2013 head CT.  Cervical spine CT 11/12/2012.  FINDINGS: CT HEAD FINDINGS  No mass lesion, mass effect, midline shift, hydrocephalus, hemorrhage. No acute territorial cortical ischemia/infarct. Atrophy and chronic ischemic  white matter disease is present.The calvarium is intact. Enlarged subarachnoid spaces are present over both frontal lobes. Intracranial atherosclerosis. Paranasal sinuses appear within normal limits.  CT CERVICAL SPINE FINDINGS  Motion artifact is present through C2 and there is also superimposed dental artifact. Allowing for these degrading technical factors, the cervical spinal alignment the is unchanged compared to the others study which was also motion degraded. Anterolisthesis of C3 on C4 and C4 on C5 is present which appears degenerative. Severe degenerative disk disease at C5-C6. There is a T2 superior endplate compression fracture which is age indeterminate and was not seen on the prior examination. There is no retropulsion and loss of height is about 10%. Upper thoracic spondylosis is present. The spinous processes are intact. Degenerative disk and facet disease is present. There is asymmetry at the atlantodental junction probably due to rotation. Incidental visualization of the lungs demonstrates bilateral pulmonary parenchymal apical scarring. Aortic arch and branch vessel atherosclerosis.  IMPRESSION: CT HEAD IMPRESSION  Atrophy and chronic ischemic white matter disease without acute intracranial abnormality.  CT CERVICAL SPINE IMPRESSION  No definite acute osseous abnormality in the cervical spine. Study degraded by motion and dental artifact. Age indeterminate T2 superior endplate pressure fracture may be chronic however this area was obscured by motion artifact on the prior exam. Nonemergent MRI if may be useful in followup to assess for marrow edema if clinically indicated. No retropulsion.   Electronically Signed   By: Andreas Newport M.D.   On:  03/20/2013 15:15   Ct Cervical Spine Wo Contrast  03/20/2013   CLINICAL DATA:  Fall from wheelchair. Head trauma.  EXAM: CT HEAD WITHOUT CONTRAST  CT CERVICAL SPINE WITHOUT CONTRAST  TECHNIQUE: Multidetector CT imaging of the head and cervical spine was performed following the standard protocol without intravenous contrast. Multiplanar CT image reconstructions of the cervical spine were also generated.  COMPARISON:  01/30/2013 head CT.  Cervical spine CT 11/12/2012.  FINDINGS: CT HEAD FINDINGS  No mass lesion, mass effect, midline shift, hydrocephalus, hemorrhage. No acute territorial cortical ischemia/infarct. Atrophy and chronic ischemic white matter disease is present.The calvarium is intact. Enlarged subarachnoid spaces are present over both frontal lobes. Intracranial atherosclerosis. Paranasal sinuses appear within normal limits.  CT CERVICAL SPINE FINDINGS  Motion artifact is present through C2 and there is also superimposed dental artifact. Allowing for these degrading technical factors, the cervical spinal alignment the is unchanged compared to the others study which was also motion degraded. Anterolisthesis of C3 on C4 and C4 on C5 is present which appears degenerative. Severe degenerative disk disease at C5-C6. There is a T2 superior endplate compression fracture which is age indeterminate and was not seen on the prior examination. There is no retropulsion and loss of height is about 10%. Upper thoracic spondylosis is present. The spinous processes are intact. Degenerative disk and facet disease is present. There is asymmetry at the atlantodental junction probably due to rotation. Incidental visualization of the lungs demonstrates bilateral pulmonary parenchymal apical scarring. Aortic arch and branch vessel atherosclerosis.  IMPRESSION: CT HEAD IMPRESSION  Atrophy and chronic ischemic white matter disease without acute intracranial abnormality.  CT CERVICAL SPINE IMPRESSION  No definite acute osseous  abnormality in the cervical spine. Study degraded by motion and dental artifact. Age indeterminate T2 superior endplate pressure fracture may be chronic however this area was obscured by motion artifact on the prior exam. Nonemergent MRI if may be useful in followup to assess for marrow edema if clinically indicated. No retropulsion.  Electronically Signed   By: Andreas Newport M.D.   On: 03/20/2013 15:15    EKG Interpretation   None       MDM   1. Fall, initial encounter    Patient presents today after having a witnessed fall. Pt at baseline. CT head unremarkable. CT cervical spine shows possible endplate pressure fracture which may be chronic. This result was discussed with the patient as well as possible outpatient MRI in the future. The family expresses understanding. They will follow up with her PCP and a print out of the cervical spine report was given to the family. Dr. Deretha Emory evaluated patient and agrees with plan. Return instructions given to family. Vital signs stable for discharge. Patient / Family / Caregiver informed of clinical course, understand medical decision-making process, and agree with plan.    Mora Bellman, PA-C 03/20/13 [phone]

## 2013-03-20 NOTE — ED Notes (Signed)
Family at bedside and caregiver.

## 2013-03-20 NOTE — ED Notes (Signed)
Pt from home with caregivers c/o fall out of wheel chair; hitting head on end table; no LOC: pt demented at baseline and alert to person only; pt mae

## 2013-03-20 NOTE — ED Notes (Signed)
Pt belongings placed in belongings bag and given to caregiver. Pt also had on a black and gold necklace and a gold necklace and those were given to the caregiver and placed in the caregivers purse.

## 2013-03-20 NOTE — ED Notes (Signed)
Sitter reports that pt fell from seated position to carpeted floor striking L side of head behind L ear no edema or laceration noted skin intact no bruising noted. Sitter denies pt lost consciousness no N/V  Or other symptom and that patient is at her neurologic norm.Pt denies pain

## 2013-03-20 NOTE — ED Notes (Signed)
Pt to CT scan.

## 2013-03-20 NOTE — ED Notes (Signed)
Denies international travel in last 30 days

## 2013-03-24 ENCOUNTER — Ambulatory Visit (INDEPENDENT_AMBULATORY_CARE_PROVIDER_SITE_OTHER): Payer: Medicare Other | Admitting: Neurology

## 2013-03-24 ENCOUNTER — Encounter: Payer: Self-pay | Admitting: Neurology

## 2013-03-24 VITALS — BP 138/80 | HR 34 | Wt 132.0 lb

## 2013-03-24 DIAGNOSIS — F0391 Unspecified dementia with behavioral disturbance: Secondary | ICD-10-CM

## 2013-03-24 NOTE — Patient Instructions (Signed)
I am going to have you follow up with your primary care provider. Your PCP may consider a referral to geriatric psychiatry.

## 2013-03-24 NOTE — Progress Notes (Signed)
Subjective:    Patient ID: Charlotte Kelly is a 79 y.o. female.  HPI  Charlotte Foley, MD, PhD [facility] [address] [address] [place], [place] [address]  Dear Shanda Bumps,   I saw your patient, Charlotte Kelly, upon your kind request in my neurologic clinic today for initial consultation of her dementia with behavioral disturbance, including violent outbursts. The patient is accompanied by her husband and her caretaked, Charlotte Kelly, today. Charlotte Kelly has been working with her daily M-F, 8:30-4:30, then another caregiver comes in from 4:30 PM to 9 PM. As you know, Charlotte Kelly is a very pleasant 79 year old right-handed woman with an underlying medical history of depression, arthritis, cataract, anxiety, insomnia, hypertension, status post cholecystectomy and small intestine surgery who was diagnosed with dementia some 5 years ago. She has had behavioral issues in the past year. She recently had a fall. She fell from her bed and has had problems with her gait. She fell on her way to the bathroom and landed on her left side. EMS was called. She has been on Depakote, Aricept, as well as Namenda. She takes Ativan as needed and Seroquel for behavioral issues as well as Zoloft for depression. She had an MRI in past, while still residing in [facility] and she was enrolled in drug trial in [facility] and had spinal taps and infusions. She has had recent CT head and CT neck after falls or trauma to her head, most recently on 03/20/13: Atrophy and chronic ischemic white matter disease without acute intracranial abnormality. No definite acute osseous abnormality in the cervical spine. Study degraded by motion and dental artifact. Age indeterminate T2 superior endplate pressure fracture may be chronic however this area was obscured by motion artifact on the prior exam. Nonemergent MRI if may be useful in followup to assess for marrow edema if clinically indicated. No retropulsion. She sleeps well at night and does not  wander, but did that before. She has had improvement in her behavioral outbursts with the Depakote. The patient is situated in the clinic wheelchair. She's not providing any history at all. Most of her history is provided by her caretaker.  Her Past Medical History Is Significant For: Past Medical History  Diagnosis Date  . Arthritis   . Depression   . Cataract   . Dementia in conditions classified elsewhere with behavioral disturbance(294.11)   . Anxiety   . Insomnia   . Insomnia, unspecified   . Other alteration of consciousness   . Hypertension   . Encounter for screening fecal occult blood testing 09/19/2012  . Alzheimer disease     Her Past Surgical History Is Significant For: Past Surgical History  Procedure Laterality Date  . Gallblader    . Small intestine surgery    . Cholecystectomy      Her Family History Is Significant For: Family History  Problem Relation Age of Onset  . Heart failure Father     Her Social History Is Significant For: History   Social History  . Marital Status: Married    Spouse Name: N/A    Number of Children: N/A  . Years of Education: N/A   Social History Main Topics  . Smoking status: Former Games developer  . Smokeless tobacco: None  . Alcohol Use: No  . Drug Use: No  . Sexual Activity: Not Currently   Other Topics Concern  . None   Social History Narrative  . None    Her Allergies Are:  No Known Allergies:  Her Current Medications Are:  Outpatient Encounter Prescriptions as of 03/24/2013  Medication Sig Dispense Refill  . aspirin 81 MG tablet Take 81 mg by mouth daily.       . Cholecalciferol (VITAMIN D) 2000 UNITS tablet Take 2,000 Units by mouth daily.       . divalproex (DEPAKOTE SPRINKLES) 125 MG capsule Take 2 capsules (250 mg total) by mouth 3 (three) times daily.  90 capsule  0  . donepezil (ARICEPT) 10 MG tablet Take 1 tablet (10 mg total) by mouth at bedtime.  30 tablet  5  . lisinopril (PRINIVIL,ZESTRIL) 10 MG  tablet Take 1 tablet (10 mg total) by mouth daily.  90 tablet  3  . LORazepam (ATIVAN) 0.5 MG tablet Take 0.5 mg by mouth every 8 (eight) hours as needed. Take one tablet every 8 hours as needed for anxiety.      . memantine (NAMENDA) 10 MG tablet Take 1 tablet (10 mg total) by mouth 2 (two) times daily.  60 tablet  5  . multivitamin-iron-minerals-folic acid (CENTRUM) chewable tablet Chew 1 tablet by mouth daily. Take one tablet once daily for supplement      . nitrofurantoin, macrocrystal-monohydrate, (MACROBID) 100 MG capsule Take 1 capsule (100 mg total) by mouth at bedtime.  30 capsule  4  . QUEtiapine (SEROQUEL) 25 MG tablet Take 1 tablet (25 mg total) by mouth 2 (two) times daily. 1/2 tablet mid morning and 1 and 1/2 at bedtime for behaviors and sleep  60 tablet  5  . senna (SENOKOT) 8.6 MG TABS tablet Take 1 tablet (8.6 mg total) by mouth daily.  30 each  0  . sertraline (ZOLOFT) 100 MG tablet Take 1 tablet (100 mg total) by mouth daily. Take daily along with 50mg  tab  30 tablet  5  . sertraline (ZOLOFT) 50 MG tablet Take 1 tablet (50 mg total) by mouth daily. Take daily along with 100mg  tab  30 tablet  5  . simvastatin (ZOCOR) 10 MG tablet Take 1 tablet (10 mg total) by mouth at bedtime.  90 tablet  3  . trospium (SANCTURA) 20 MG tablet Take 20 mg by mouth 2 (two) times daily.       No facility-administered encounter medications on file as of 03/24/2013.   Review of Systems:  Out of a complete 14 point review of systems, all are reviewed and negative with the exception of these symptoms as listed below:  Review of Systems  Neurological:       Memory loss, Confusion  Hematological: Bruises/bleeds easily.  Psychiatric/Behavioral:       Anxiety    Objective:  Neurologic Exam  Physical Exam Physical Examination:   Filed Vitals:   03/24/13 1325  BP: 138/80  Pulse: 34    General Examination: The patient is a very pleasant 79 y.o. female in no acute distress. She appears frail  and is situated in a clinic WC.   HEENT: Normocephalic, atraumatic, pupils are equal, round and reactive to light and accommodation. Funduscopic exam is not possible. Extraocular tracking is poor. Hearing is grossly intact. Face is symmetric with normal facial animation and normal facial sensation. Speech is clear with no dysarthria noted. There is no hypophonia. There is no lip, neck/head, jaw or voice tremor. Neck is supple with full range of passive and active motion. There are no carotid bruits on auscultation. Oropharynx exam reveals: mild mouth dryness, adequate dental hygiene with dentures in place.   Chest: Clear to auscultation without  wheezing, rhonchi or crackles noted.  Heart: S1+S2+0, regular and normal without murmurs, rubs or gallops noted.   Abdomen: Soft, non-tender and non-distended with normal bowel sounds appreciated on auscultation.  Extremities: There is no pitting edema in the distal lower extremities bilaterally. Pedal pulses are intact.  Skin: Warm and dry without trophic changes noted. There are no varicose veins.  Musculoskeletal: exam reveals no obvious joint deformities, tenderness or joint swelling or erythema.   Neurologically:  Mental status: The patient is awake, alert and oriented in all 4 spheres. Her memory, attention, language and knowledge are significantly impaired. She is not able to answer any questions appropriately. She is not oriented to place, date or time or month or year or situation. She seems to be oriented to self only. She does not name her husband's name. She may have some visual hallucinations. Speech is clear. She is not able to follow even simple commands, even with mimicking.   Cranial nerves are as described above under HEENT exam. In addition, shoulder shrug is normal with equal shoulder height noted. Motor exam: Thin bulk and a strength 4/5, normal tone is noted. There is no drift, tremor or rebound. Romberg is not tested. She does not  have any obvious fine motor dysfunction. She does not have any resting tremor. I did not have her stand or walk for me today as they did not bring her walker. I did not feel it was safe to try to stand or walk her.  Assessment and Plan:  In summary, Anastasya Jewell is an 79 y.o.-year old female with a history of advanced dementia with behavioral disturbance. At this juncture she is on maximum therapy with Aricept and Namenda which she tolerates fine. No side effects are reported by the caregiver or her husband. As far as her sleep and her behavioral concerns or concerns she has been on Zoloft, Seroquel as well as Depakote. She seems to be sleeping well. No history of RBD as reported. She has had no recent behavioral escalations. I am not sure that I can add anything at this juncture for this patient. She has previously had imaging studies and workup for dementia when they was still residing in [place]. She has had balance problems and recent falls. I reviewed her recent CT head reports as well as CT cervical spine report. Down the road if she has further problems with behavioral escalations you can consider a referral to GU reactive psychiatry. I explained to the patient's caretaker and husband that I am not going to be able to add a whole lot at this point. They understood. I will have her followup with you and can see her back on an as-needed basis. They were in agreement.  Thank you very much for allowing me to participate in the care of this patient. If I can be of any further assistance to you please do not hesitate to call me at [phone].  Sincerely,   Charlotte Foley, MD, PhD

## 2013-04-07 ENCOUNTER — Telehealth: Payer: Self-pay | Admitting: *Deleted

## 2013-04-07 NOTE — Telephone Encounter (Signed)
For insurance purpose patient needs an order written for a  Liftchair. Please fax to # [id number] to [facility] Attm: Augusto Gamble

## 2013-04-12 ENCOUNTER — Other Ambulatory Visit: Payer: Self-pay | Admitting: Internal Medicine

## 2013-04-14 DIAGNOSIS — M6281 Muscle weakness (generalized): Secondary | ICD-10-CM | POA: Diagnosis not present

## 2013-04-16 DIAGNOSIS — M6281 Muscle weakness (generalized): Secondary | ICD-10-CM | POA: Diagnosis not present

## 2013-04-21 DIAGNOSIS — M6281 Muscle weakness (generalized): Secondary | ICD-10-CM | POA: Diagnosis not present

## 2013-04-23 DIAGNOSIS — M6281 Muscle weakness (generalized): Secondary | ICD-10-CM | POA: Diagnosis not present

## 2013-04-30 ENCOUNTER — Telehealth: Payer: Self-pay | Admitting: *Deleted

## 2013-04-30 NOTE — Telephone Encounter (Signed)
I agree; may hold medication if behaviors are fine and the medication is causing sedation, to follow up if behaviors worsen or changes occur

## 2013-04-30 NOTE — Telephone Encounter (Signed)
Patient states that there is a change in patient's behavior. States that the Depakote is causing extremely sedation. And her appitite is extremely poor. Charlotte Kelly stated that she hasn't given Depakote yesterday or today or the 1/2 tablet at noon of the Seroquel. States that she is alert after not giving this after 2 days and behavior is fine. Please Advise. Caregiver states that "this woman is sedation and couldn't give her this medication in good conscience"

## 2013-05-01 NOTE — Telephone Encounter (Signed)
Message left on triage voicemail please call Adrienne(patient's caregiver)  I called Hansel Starling and she was questioning if Shanda Bumps responded to message. I discussed with caregiver Jessica's response. Adrienne verbalized understanding of response and asked for information to be shared with patient's daughter, Randa Evens [phone] (call close to 5 pm)   I will call Randa Evens close to 5 pm

## 2013-05-02 NOTE — Telephone Encounter (Signed)
Spoke with patient's daughter, Randa Evens verbalized understanding of Jessica's recommendation

## 2013-05-15 ENCOUNTER — Other Ambulatory Visit: Payer: Self-pay | Admitting: Internal Medicine

## 2013-06-25 ENCOUNTER — Encounter: Payer: Self-pay | Admitting: Nurse Practitioner

## 2013-07-03 ENCOUNTER — Other Ambulatory Visit: Payer: Self-pay | Admitting: Nurse Practitioner

## 2013-07-03 ENCOUNTER — Encounter: Payer: Self-pay | Admitting: Nurse Practitioner

## 2013-07-03 ENCOUNTER — Ambulatory Visit (INDEPENDENT_AMBULATORY_CARE_PROVIDER_SITE_OTHER): Payer: Medicare Other | Admitting: Nurse Practitioner

## 2013-07-03 VITALS — BP 116/68 | HR 82 | Resp 10 | Wt 119.0 lb

## 2013-07-03 DIAGNOSIS — R634 Abnormal weight loss: Secondary | ICD-10-CM | POA: Diagnosis not present

## 2013-07-03 DIAGNOSIS — F0281 Dementia in other diseases classified elsewhere with behavioral disturbance: Secondary | ICD-10-CM | POA: Diagnosis not present

## 2013-07-03 DIAGNOSIS — G4700 Insomnia, unspecified: Secondary | ICD-10-CM | POA: Diagnosis not present

## 2013-07-03 DIAGNOSIS — F02818 Dementia in other diseases classified elsewhere, unspecified severity, with other behavioral disturbance: Secondary | ICD-10-CM | POA: Diagnosis not present

## 2013-07-03 DIAGNOSIS — F411 Generalized anxiety disorder: Secondary | ICD-10-CM

## 2013-07-03 DIAGNOSIS — F419 Anxiety disorder, unspecified: Secondary | ICD-10-CM

## 2013-07-03 MED ORDER — MEMANTINE HCL ER 28 MG PO CP24: 28.0000 mg | ORAL_CAPSULE | Freq: Every day | ORAL | Status: DC

## 2013-07-03 MED ORDER — QUETIAPINE FUMARATE 25 MG PO TABS: ORAL_TABLET | ORAL | Status: DC

## 2013-07-03 NOTE — Patient Instructions (Signed)
Will add the 1/2 tablet of Seroquel back in the morning, and increase bedtime to 2 tablets to help her sleep throughout the night  We will do this for a few weeks and see how this goes

## 2013-07-03 NOTE — Progress Notes (Signed)
Patient ID: Charlotte Kelly, female   DOB: 31-Jan-1930, 80 y.o.   MRN: [id number]    No Known Allergies  Chief Complaint  Patient presents with  . Crying Spells    patient with frequent crying spells, very fearful, and not sleeping through the night     HPI: Patient is a 80 y.o. female seen in the office today for acute visit due to frequent crying and being fearful; was previous on Depakote and seroquel due to behaviors but due to over sedation Depakote was stopped and Seroquel was limited to bedtime only-- pt was very lethargic and had decrease appetite for 2 weeks; lost weight during that time; now she is eating better  She is more ambulatory and more alert; communicates at times and when she wants to  More recently she is not sleeping through the night-- awakens early and is tearful laying in the bed and the family feels like she is fearful of being alone -when she was on Depakote and Seroquel she was sleeping though out the night but sleepy and napping all day  Review of Systems:  Unable to obtain  Past Medical History  Diagnosis Date  . Arthritis   . Depression   . Cataract   . Dementia in conditions classified elsewhere with behavioral disturbance   . Anxiety   . Insomnia   . Insomnia, unspecified   . Other alteration of consciousness   . Hypertension   . Encounter for screening fecal occult blood testing 09/19/2012  . Alzheimer disease    Past Surgical History  Procedure Laterality Date  . Gallblader    . Small intestine surgery    . Cholecystectomy     Social History:   reports that she has quit smoking. She does not have any smokeless tobacco history on file. She reports that she does not drink alcohol or use illicit drugs.  Family History  Problem Relation Age of Onset  . Heart failure Father     Medications: Patient's Medications  New Prescriptions   No medications on file  Previous Medications   ASPIRIN 81 MG TABLET    Take 81 mg by mouth daily.    CHOLECALCIFEROL (VITAMIN D) 2000 UNITS TABLET    Take 2,000 Units by mouth daily.    DONEPEZIL (ARICEPT) 10 MG TABLET    Take 1 tablet (10 mg total) by mouth at bedtime.   LISINOPRIL (PRINIVIL,ZESTRIL) 10 MG TABLET    Take 1 tablet (10 mg total) by mouth daily.   LORAZEPAM (ATIVAN) 0.5 MG TABLET    TAKE 1 TABLET BY MOUTH EVERY 8 HOURS AS NEEDED ANXIETY   MEMANTINE (NAMENDA) 10 MG TABLET    Take 1 tablet (10 mg total) by mouth 2 (two) times daily.   MULTIVITAMIN-IRON-MINERALS-FOLIC ACID (CENTRUM) CHEWABLE TABLET    Chew 1 tablet by mouth daily. Take one tablet once daily for supplement   NITROFURANTOIN, MACROCRYSTAL-MONOHYDRATE, (MACROBID) 100 MG CAPSULE    Take 1 capsule (100 mg total) by mouth at bedtime.   SERTRALINE (ZOLOFT) 100 MG TABLET    Take 1 tablet (100 mg total) by mouth daily. Take daily along with 50mg  tab   SERTRALINE (ZOLOFT) 50 MG TABLET    Take 1 tablet (50 mg total) by mouth daily. Take daily along with 100mg  tab   SIMVASTATIN (ZOCOR) 10 MG TABLET    Take 1 tablet (10 mg total) by mouth at bedtime.   TROSPIUM (SANCTURA) 20 MG TABLET    Take 20 mg by mouth  at bedtime.   Modified Medications   Modified Medication Previous Medication   QUETIAPINE (SEROQUEL) 25 MG TABLET QUEtiapine (SEROQUEL) 25 MG tablet      Take 25 mg by mouth. 1 and 1/2 at bedtime for behaviors and sleep    Take 1 tablet (25 mg total) by mouth 2 (two) times daily. 1/2 tablet mid morning and 1 and 1/2 at bedtime for behaviors and sleep   SENNA (SENOKOT) 8.6 MG TABS TABLET senna (SENOKOT) 8.6 MG TABS tablet      Take 2 tablets by mouth daily.    Take 1 tablet (8.6 mg total) by mouth daily.  Discontinued Medications   DIVALPROEX (DEPAKOTE SPRINKLES) 125 MG CAPSULE    Take 2 capsules (250 mg total) by mouth 3 (three) times daily.   SERTRALINE (ZOLOFT) 25 MG TABLET    TAKE 1 TABLET BY MOUTH DAILY WITH THE 100MG  TABLET     Physical Exam:  Filed Vitals:   07/03/13 1045  BP: 116/68  Pulse: 82  Resp: 10    Weight: 119 lb (53.978 kg)  SpO2: 95%    Physical Exam  Vitals reviewed. Constitutional: She is well-developed, well-nourished, and in no distress.  HENT:  Head: Normocephalic and atraumatic.  Nose: Nose normal.  Mouth/Throat: Oropharynx is clear and moist. No oropharyngeal exudate.  Eyes: Conjunctivae and EOM are normal. Pupils are equal, round, and reactive to light.  Neck: Normal range of motion. Neck supple. No thyromegaly present.  Cardiovascular: Normal rate, regular rhythm and normal heart sounds.   Pulmonary/Chest: Effort normal and breath sounds normal. No respiratory distress.  Abdominal: Soft. Bowel sounds are normal. She exhibits no distension.  Musculoskeletal: She exhibits no edema and no tenderness.  Lymphadenopathy:    She has no cervical adenopathy.  Neurological: She is alert.  Skin: Skin is warm.  Psychiatric:  Babbles; does not follow all commands or answer questions appropriately when asked but she will respond; does not appear anxious or agitated during visit     Labs reviewed: Basic Metabolic Panel:  Recent Labs  09/17/12 0948 11/06/12 1425 12/11/12 1409 12/19/12 1210  NA 144 144 137 142  K 4.8 4.1 4.4 4.6  CL 103 104 98 101  CO2 25 20 22 26   GLUCOSE 137* 147* 194* 165*  BUN 17 22 19 26   CREATININE 0.84 0.51* 0.61 0.87  CALCIUM 9.3 9.0 8.9 9.2  TSH 3.930  --   --   --    Liver Function Tests:  Recent Labs  09/17/12 0948 11/06/12 1425 12/19/12 1210  AST 16 13 19   ALT 17 12 16   ALKPHOS 107 92 85  BILITOT 0.3 0.2 0.3  PROT 7.2 6.8 6.8   No results found for this basename: LIPASE, AMYLASE,  in the last 8760 hours No results found for this basename: AMMONIA,  in the last 8760 hours CBC:  Recent Labs  09/17/12 0948 11/06/12 1425 12/19/12 1210  WBC 7.0 8.2 8.8  NEUTROABS 4.6 5.0 6.1  HGB 12.9 11.9 12.2  HCT 40.3 36.1 38.1  MCV 93 90 91  PLT  --   --  190   Lipid Panel:  Recent Labs  09/17/12 0948 10/01/12 0959  03/18/13 0933  HDL 37* 40 47  LDLCALC 149* 115* 114*  TRIG 236* 175* 227*  CHOLHDL 6.3* 4.8* 4.4   TSH:  Recent Labs  09/17/12 0948  TSH 3.930   A1C: No components found with this basename: A1C,    Assessment/Plan 1. Dementia in  conditions classified elsewhere with behavioral disturbance(294.11) -will cont namenda and aricept - CBC With differential/Platelet - Comprehensive metabolic panel - QUEtiapine (SEROQUEL) 25 MG tablet; 1/2 tablet in the am (if needed) and 2 tablet at bedtime for behaviors and sleep  Dispense: 30 tablet; Refill: 3 -will start by trying to increase seroquel to 2 tablets at night; if she conts to be tearful may add 1/2 tablet back during the day - Memantine HCl ER (NAMENDA XR) 28 MG CP24; Take 28 mg by mouth daily.  Dispense: 30 capsule; Refill: 3  2. Anxiety -overall has improved; however still with tearful episodes in the morning; will start with increase in seroquel at night to help pt sleep which will hopefully help her anxiety   3. Insomnia -see #2  4. Weight loss - with increase appetite and now eating 3 meals a day; will cont to follow up on weights   To follow up with Dr Mariea Clonts in 4 weeks  Daughter called with discussion and updated on plan of care 40 Time TOTAL:  time greater than 50% of total time spent doing pt counseled and coordination of care regarding disease and disease progression of dementia

## 2013-07-04 LAB — CBC WITH DIFFERENTIAL
Basophils Absolute: 0 10*3/uL (ref 0.0–0.2)
Basos: 0 %
EOS ABS: 0.1 10*3/uL (ref 0.0–0.4)
Eos: 1 %
HCT: 38.6 % (ref 34.0–46.6)
Hemoglobin: 12.8 g/dL (ref 11.1–15.9)
IMMATURE GRANS (ABS): 0 10*3/uL (ref 0.0–0.1)
IMMATURE GRANULOCYTES: 0 %
Lymphocytes Absolute: 1.3 10*3/uL (ref 0.7–3.1)
Lymphs: 18 %
MCH: 29.9 pg (ref 26.6–33.0)
MCHC: 33.2 g/dL (ref 31.5–35.7)
MCV: 90 fL (ref 79–97)
MONOS ABS: 0.6 10*3/uL (ref 0.1–0.9)
Monocytes: 9 %
NEUTROS PCT: 72 %
Neutrophils Absolute: 5.1 10*3/uL (ref 1.4–7.0)
PLATELETS: 226 10*3/uL (ref 150–379)
RBC: 4.28 x10E6/uL (ref 3.77–5.28)
RDW: 14.5 % (ref 12.3–15.4)
WBC: 7.1 10*3/uL (ref 3.4–10.8)

## 2013-07-04 LAB — COMPREHENSIVE METABOLIC PANEL
A/G RATIO: 1.5 (ref 1.1–2.5)
ALK PHOS: 93 IU/L (ref 39–117)
ALT: 11 IU/L (ref 0–32)
AST: 11 IU/L (ref 0–40)
Albumin: 3.9 g/dL (ref 3.5–4.7)
BUN / CREAT RATIO: 16 (ref 11–26)
BUN: 10 mg/dL (ref 8–27)
CALCIUM: 9 mg/dL (ref 8.7–10.3)
CO2: 22 mmol/L (ref 18–29)
Chloride: 101 mmol/L (ref 97–108)
Creatinine, Ser: 0.64 mg/dL (ref 0.57–1.00)
GFR calc Af Amer: 97 mL/min/{1.73_m2} (ref >59)
GFR calc non Af Amer: 84 mL/min/{1.73_m2} (ref >59)
Globulin, Total: 2.6 g/dL (ref 1.5–4.5)
Glucose: 189 mg/dL — ABNORMAL HIGH (ref 65–99)
POTASSIUM: 3.9 mmol/L (ref 3.5–5.2)
SODIUM: 141 mmol/L (ref 134–144)
Total Bilirubin: 0.3 mg/dL (ref 0.0–1.2)
Total Protein: 6.5 g/dL (ref 6.0–8.5)

## 2013-07-12 ENCOUNTER — Other Ambulatory Visit: Payer: Self-pay | Admitting: Internal Medicine

## 2013-07-23 ENCOUNTER — Ambulatory Visit: Payer: Medicare Other | Admitting: Nurse Practitioner

## 2013-07-31 ENCOUNTER — Ambulatory Visit: Payer: Medicare Other | Admitting: Internal Medicine

## 2013-08-17 ENCOUNTER — Other Ambulatory Visit: Payer: Self-pay | Admitting: Internal Medicine

## 2013-08-19 ENCOUNTER — Other Ambulatory Visit: Payer: Self-pay | Admitting: Nurse Practitioner

## 2013-08-26 ENCOUNTER — Other Ambulatory Visit: Payer: Self-pay | Admitting: Nurse Practitioner

## 2013-08-26 ENCOUNTER — Other Ambulatory Visit: Payer: Self-pay | Admitting: Internal Medicine

## 2013-08-27 ENCOUNTER — Other Ambulatory Visit: Payer: Self-pay | Admitting: Nurse Practitioner

## 2013-09-04 ENCOUNTER — Ambulatory Visit (INDEPENDENT_AMBULATORY_CARE_PROVIDER_SITE_OTHER): Payer: Medicare Other | Admitting: Internal Medicine

## 2013-09-04 ENCOUNTER — Encounter: Payer: Self-pay | Admitting: Internal Medicine

## 2013-09-04 VITALS — BP 122/70 | HR 67 | Temp 97.3°F | Wt 126.6 lb

## 2013-09-04 DIAGNOSIS — F02818 Dementia in other diseases classified elsewhere, unspecified severity, with other behavioral disturbance: Secondary | ICD-10-CM

## 2013-09-04 DIAGNOSIS — F419 Anxiety disorder, unspecified: Secondary | ICD-10-CM

## 2013-09-04 DIAGNOSIS — G309 Alzheimer's disease, unspecified: Secondary | ICD-10-CM | POA: Diagnosis not present

## 2013-09-04 DIAGNOSIS — F482 Pseudobulbar affect: Secondary | ICD-10-CM | POA: Insufficient documentation

## 2013-09-04 DIAGNOSIS — H00019 Hordeolum externum unspecified eye, unspecified eyelid: Secondary | ICD-10-CM | POA: Insufficient documentation

## 2013-09-04 DIAGNOSIS — I10 Essential (primary) hypertension: Secondary | ICD-10-CM

## 2013-09-04 DIAGNOSIS — F411 Generalized anxiety disorder: Secondary | ICD-10-CM

## 2013-09-04 DIAGNOSIS — F0280 Dementia in other diseases classified elsewhere without behavioral disturbance: Secondary | ICD-10-CM | POA: Diagnosis not present

## 2013-09-04 DIAGNOSIS — F0281 Dementia in other diseases classified elsewhere with behavioral disturbance: Secondary | ICD-10-CM

## 2013-09-04 DIAGNOSIS — G301 Alzheimer's disease with late onset: Secondary | ICD-10-CM

## 2013-09-04 MED ORDER — DEXTROMETHORPHAN-QUINIDINE 20-10 MG PO CAPS: 1.0000 | ORAL_CAPSULE | Freq: Two times a day (BID) | ORAL | Status: DC

## 2013-09-04 MED ORDER — QUETIAPINE FUMARATE 25 MG PO TABS: ORAL_TABLET | ORAL | Status: DC

## 2013-09-04 NOTE — Progress Notes (Signed)
Patient ID: Charlotte Kelly, female   DOB: [birth year], 80 y.o.   MRN: [id number]   Location:  [facility] [place]  No Known Allergies  Chief Complaint  Patient presents with  . Follow-up    4 week f/u   . Immunizations    HPI: Patient is a 80 y.o. white female seen in the office today for 4 week f/u.  She has a stye on her eye that is not coming down so far conservatively.    Otherwise she is about the same, having some periods of agitation that improve with ativan, on scheduled seroquel also.  Review of Systems:  Review of Systems  Unable to perform ROS: mental acuity  pt's responses do not make sense   Past Medical History  Diagnosis Date  . Arthritis   . Depression   . Cataract   . Dementia in conditions classified elsewhere with behavioral disturbance   . Anxiety   . Insomnia   . Insomnia, unspecified   . Other alteration of consciousness   . Hypertension   . Encounter for screening fecal occult blood testing 09/19/2012  . Alzheimer disease     Past Surgical History  Procedure Laterality Date  . Gallblader    . Small intestine surgery    . Cholecystectomy      Social History:   reports that she has quit smoking. She does not have any smokeless tobacco history on file. She reports that she does not drink alcohol or use illicit drugs.  Family History  Problem Relation Age of Onset  . Heart failure Father     Medications: Patient's Medications  New Prescriptions   No medications on file  Previous Medications   ASPIRIN 81 MG TABLET    Take 81 mg by mouth daily.    CHOLECALCIFEROL (VITAMIN D) 2000 UNITS TABLET    Take 2,000 Units by mouth daily.    DONEPEZIL (ARICEPT) 10 MG TABLET    TAKE 1 TABLET BY MOUTH EVERY DAY FOR MEMORY.   LISINOPRIL (PRINIVIL,ZESTRIL) 10 MG TABLET    Take 1 tablet (10 mg total) by mouth daily.   LORAZEPAM (ATIVAN) 0.5 MG TABLET    TAKE 1 TABLET BY MOUTH EVERY 8 HOURS AS NEEDED FOR ANXIETY   MEMANTINE  HCL ER (NAMENDA XR) 28 MG CP24    Take 28 mg by mouth daily.   MULTIVITAMIN-IRON-MINERALS-FOLIC ACID (CENTRUM) CHEWABLE TABLET    Chew 1 tablet by mouth daily. Take one tablet once daily for supplement   NITROFURANTOIN, MACROCRYSTAL-MONOHYDRATE, (MACROBID) 100 MG CAPSULE    Take 1 capsule (100 mg total) by mouth at bedtime.   QUETIAPINE (SEROQUEL) 25 MG TABLET    1/2 tablet in the am and 2 tablet at bedtime for behaviors and sleep   SENNA (SENOKOT) 8.6 MG TABS TABLET    Take 2 tablets by mouth daily.   SERTRALINE (ZOLOFT) 100 MG TABLET    TAKE 1 TABLET BY MOUTH DAILY ALONG WITH THE 50MG  TABLET   SERTRALINE (ZOLOFT) 50 MG TABLET    TAKE 1 TABLET BY MOUTH DAILY ALONG WITH THE 100MG  TABLET   SIMVASTATIN (ZOCOR) 10 MG TABLET    Take 1 tablet (10 mg total) by mouth at bedtime.   TROSPIUM (SANCTURA) 20 MG TABLET    Take 20 mg by mouth at bedtime.   Modified Medications   No medications on file  Discontinued Medications   DONEPEZIL (ARICEPT) 10 MG TABLET    Take 1 tablet (  10 mg total) by mouth at bedtime.   SERTRALINE (ZOLOFT) 25 MG TABLET    TAKE 1 TABLET DAILY ALONG WITH THE 100MG  TABLET   SIMVASTATIN (ZOCOR) 20 MG TABLET    TAKE 1 TABLET BY MOUTH AT BEDTIME     Physical Exam: Filed Vitals:   09/04/13 1045  BP: 122/70  Pulse: 67  Temp: 97.3 F (36.3 C)  TempSrc: Oral  Weight: 126 lb 9.6 oz (57.425 kg)  SpO2: 99%  Physical Exam  Constitutional:  Frail white female, nad  Eyes:  Stye on upper eyelid  Cardiovascular: Normal rate, regular rhythm, normal heart sounds and intact distal pulses.   Pulmonary/Chest: Effort normal and breath sounds normal. No respiratory distress.  Abdominal: Soft. Bowel sounds are normal. She exhibits no mass. There is no tenderness.  Neurological: She is alert.  Responds very well to the caregiver  Psychiatric:  Fidgety, cannot sit still, verbal responses suggestive of some aphasia/word salad    Labs reviewed: Basic Metabolic Panel:  Recent Labs   09/17/12 0948  12/11/12 1409 12/19/12 1210 07/03/13 1140  NA 144  < > 137 142 141  K 4.8  < > 4.4 4.6 3.9  CL 103  < > 98 101 101  CO2 25  < > 22 26 22   GLUCOSE 137*  < > 194* 165* 189*  BUN 17  < > 19 26 10   CREATININE 0.84  < > 0.61 0.87 0.64  CALCIUM 9.3  < > 8.9 9.2 9.0  TSH 3.930  --   --   --   --   < > = values in this interval not displayed. Liver Function Tests:  Recent Labs  11/06/12 1425 12/19/12 1210 07/03/13 1140  AST 13 19 11   ALT 12 16 11   ALKPHOS 92 85 93  BILITOT 0.2 0.3 0.3  PROT 6.8 6.8 6.5   No results found for this basename: LIPASE, AMYLASE,  in the last 8760 hours No results found for this basename: AMMONIA,  in the last 8760 hours CBC:  Recent Labs  11/06/12 1425 12/19/12 1210 07/03/13 1140  WBC 8.2 8.8 7.1  NEUTROABS 5.0 6.1 5.1  HGB 11.9 12.2 12.8  HCT 36.1 38.1 38.6  MCV 90 91 90  PLT  --  190 226   Lipid Panel:  Recent Labs  09/17/12 0948 10/01/12 0959 03/18/13 0933  HDL 37* 40 47  LDLCALC 149* 115* 114*  TRIG 236* 175* 227*  CHOLHDL 6.3* 4.8* 4.4   Lab Results  Component Value Date   HGBA1C 5.9* 03/18/2013    Assessment/Plan 1. Stye external - painful, swollen, red -advised to try to apply warm compresses until seen by ophtho - Ambulatory referral to Ophthalmology  2. Alzheimer's disease With severe behavioral disturbance at times -has required antipsychotics, continues on aricept and namenda XR also -husband and caregiver provide care in [place]  3. Anxiety -cont lorazepam--unable to calm her any other way at times unfortunately  4. HTN (hypertension) -cont lisinopril alone for this  5. Pseudobulbar affect - Dextromethorphan-Quinidine (NUEDEXTA) 20-10 MG CAPS; Take 1 capsule by mouth 2 (two) times daily. Give daily for one week, then increase to twice a day pseudobulbar affect 310.81  Dispense: 60 capsule; Refill: 3  6. Dementia in conditions classified elsewhere with behavioral disturbance(294.11) -  Dextromethorphan-Quinidine (NUEDEXTA) 20-10 MG CAPS; Take 1 capsule by mouth 2 (two) times daily. Give daily for one week, then increase to twice a day pseudobulbar affect 310.81  Dispense: 60 capsule;  Refill: 3 - QUEtiapine (SEROQUEL) 25 MG tablet; 2 tablet at bedtime for behaviors and sleep  Dispense: 60 tablet; Refill: 3    Labs/tests ordered:  No labs today Next appt: 6 wks with Janett Billow

## 2013-09-11 ENCOUNTER — Telehealth: Payer: Self-pay | Admitting: *Deleted

## 2013-09-11 NOTE — Telephone Encounter (Signed)
Arnell Sieving, Husband, called and stated that the Nudexta is too expensive and needs it changed to something cheaper. Please Advise

## 2013-09-11 NOTE — Telephone Encounter (Signed)
There is no alternative.  There is only one drug for pseudobulbar affect.  Please call [facility] and make sure that they put it through under the proper diagnosis.  I don't think it should be this expensive.

## 2013-09-15 ENCOUNTER — Other Ambulatory Visit: Payer: Self-pay | Admitting: *Deleted

## 2013-09-15 DIAGNOSIS — E785 Hyperlipidemia, unspecified: Secondary | ICD-10-CM

## 2013-09-15 MED ORDER — SIMVASTATIN 10 MG PO TABS: 10.0000 mg | ORAL_TABLET | Freq: Every day | ORAL | Status: DC

## 2013-09-15 NOTE — Telephone Encounter (Signed)
[facility] [address]

## 2013-09-17 ENCOUNTER — Other Ambulatory Visit: Payer: Self-pay | Admitting: *Deleted

## 2013-09-17 DIAGNOSIS — E785 Hyperlipidemia, unspecified: Secondary | ICD-10-CM

## 2013-09-17 MED ORDER — SIMVASTATIN 10 MG PO TABS
10.0000 mg | ORAL_TABLET | Freq: Every day | ORAL | Status: DC
Start: 2013-09-17 — End: 2014-09-18

## 2013-09-17 NOTE — Telephone Encounter (Signed)
Patient's husband is aware that there is no alternative. I called [facility] and they indicated rx was 25 dollars. I left message on vm for patient to return call to discuss if she would continue medication or not

## 2013-09-17 NOTE — Telephone Encounter (Signed)
Patient's husband returned call and indicated patient is currently on medication and if she d/c we will be informed

## 2013-09-17 NOTE — Telephone Encounter (Signed)
[facility] [address] 

## 2013-09-18 ENCOUNTER — Other Ambulatory Visit: Payer: Self-pay | Admitting: Internal Medicine

## 2013-09-24 DIAGNOSIS — D2310 Other benign neoplasm of skin of unspecified eyelid, including canthus: Secondary | ICD-10-CM | POA: Diagnosis not present

## 2013-09-24 DIAGNOSIS — Z961 Presence of intraocular lens: Secondary | ICD-10-CM | POA: Diagnosis not present

## 2013-10-07 ENCOUNTER — Other Ambulatory Visit: Payer: Self-pay | Admitting: *Deleted

## 2013-10-07 DIAGNOSIS — F02818 Dementia in other diseases classified elsewhere, unspecified severity, with other behavioral disturbance: Secondary | ICD-10-CM

## 2013-10-07 DIAGNOSIS — F0281 Dementia in other diseases classified elsewhere with behavioral disturbance: Secondary | ICD-10-CM

## 2013-10-07 MED ORDER — MEMANTINE HCL ER 28 MG PO CP24: ORAL_CAPSULE | ORAL | Status: DC

## 2013-10-07 NOTE — Telephone Encounter (Signed)
Patient husband requested #[phone]

## 2013-10-10 ENCOUNTER — Other Ambulatory Visit: Payer: Self-pay | Admitting: Nurse Practitioner

## 2013-10-13 ENCOUNTER — Other Ambulatory Visit: Payer: Self-pay | Admitting: Internal Medicine

## 2013-10-13 DIAGNOSIS — F0281 Dementia in other diseases classified elsewhere with behavioral disturbance: Secondary | ICD-10-CM

## 2013-10-13 DIAGNOSIS — F02818 Dementia in other diseases classified elsewhere, unspecified severity, with other behavioral disturbance: Secondary | ICD-10-CM

## 2013-10-13 NOTE — Telephone Encounter (Signed)
Refilled Seroquel at [facility]  For 1/2 tab in AM and 2-tabs in the PM per caregiver.

## 2013-10-23 ENCOUNTER — Ambulatory Visit (INDEPENDENT_AMBULATORY_CARE_PROVIDER_SITE_OTHER): Payer: Medicare Other | Admitting: Nurse Practitioner

## 2013-10-23 ENCOUNTER — Other Ambulatory Visit: Payer: Self-pay | Admitting: Nurse Practitioner

## 2013-10-23 ENCOUNTER — Encounter: Payer: Self-pay | Admitting: Nurse Practitioner

## 2013-10-23 VITALS — BP 132/76 | HR 72 | Temp 97.9°F | Wt 126.8 lb

## 2013-10-23 DIAGNOSIS — F0281 Dementia in other diseases classified elsewhere with behavioral disturbance: Secondary | ICD-10-CM | POA: Diagnosis not present

## 2013-10-23 DIAGNOSIS — K5900 Constipation, unspecified: Secondary | ICD-10-CM | POA: Diagnosis not present

## 2013-10-23 DIAGNOSIS — H00019 Hordeolum externum unspecified eye, unspecified eyelid: Secondary | ICD-10-CM

## 2013-10-23 DIAGNOSIS — G4700 Insomnia, unspecified: Secondary | ICD-10-CM

## 2013-10-23 DIAGNOSIS — F419 Anxiety disorder, unspecified: Secondary | ICD-10-CM

## 2013-10-23 DIAGNOSIS — F411 Generalized anxiety disorder: Secondary | ICD-10-CM | POA: Diagnosis not present

## 2013-10-23 DIAGNOSIS — F482 Pseudobulbar affect: Secondary | ICD-10-CM

## 2013-10-23 DIAGNOSIS — F02818 Dementia in other diseases classified elsewhere, unspecified severity, with other behavioral disturbance: Secondary | ICD-10-CM | POA: Diagnosis not present

## 2013-10-23 MED ORDER — QUETIAPINE FUMARATE 25 MG PO TABS: ORAL_TABLET | ORAL | Status: DC

## 2013-10-23 MED ORDER — LINACLOTIDE 145 MCG PO CAPS: 145.0000 ug | ORAL_CAPSULE | Freq: Every day | ORAL | Status: DC

## 2013-10-23 MED ORDER — SERTRALINE HCL 100 MG PO TABS: 200.0000 mg | ORAL_TABLET | Freq: Every day | ORAL | Status: DC

## 2013-10-23 NOTE — Progress Notes (Signed)
Patient ID: Charlotte Kelly, female   DOB: [birth year], 80 y.o.   MRN: [id number]    No Known Allergies  Chief Complaint  Patient presents with  . Medical Management of Chronic Issues    6 week f/u   . other    LT eyelid is red & swollen x 3 days    HPI: Patient is a 80 y.o. female seen in the office today for follow up on new medications Caregiver with pt today, reports left lower eye lid is red and swollen for 3 days but pt does not have any complaints. No drainage, no pain, not itching.  Pt still having crying spells. Started taking neudexa last month and this has not helped at all. Had 3 crying spells yesterday. Crying through ativan which normally helps.  Husband giving pt increased dose of ativan 1 mg at night to help her sleep though the night Taking 1/2 seroquel in morning 12.5 mg and and 2 tablets at night 50 mg qhs  Caregiver gives her scheduled ativan when she takes her out which helps  Review of Systems:  Provided by caretaker Review of Systems  Constitutional: Negative for fever, chills and malaise/fatigue.  Respiratory: Negative for shortness of breath.   Cardiovascular: Negative for chest pain.  Gastrointestinal: Positive for constipation. Negative for heartburn, nausea, vomiting, abdominal pain and diarrhea.  Genitourinary: Negative for dysuria.  Musculoskeletal: Negative for joint pain and myalgias.  Skin: Negative.   Neurological: Negative for weakness.  Psychiatric/Behavioral: Positive for memory loss. The patient is nervous/anxious and has insomnia.      Past Medical History  Diagnosis Date  . Arthritis   . Depression   . Cataract   . Dementia in conditions classified elsewhere with behavioral disturbance   . Anxiety   . Insomnia   . Insomnia, unspecified   . Other alteration of consciousness   . Hypertension   . Encounter for screening fecal occult blood testing 09/19/2012  . Alzheimer disease    Past Surgical History  Procedure Laterality Date  .  Gallblader    . Small intestine surgery    . Cholecystectomy     Social History:   reports that she has quit smoking. She does not have any smokeless tobacco history on file. She reports that she does not drink alcohol or use illicit drugs.  Family History  Problem Relation Age of Onset  . Heart failure Father     Medications: Patient's Medications  New Prescriptions   No medications on file  Previous Medications   ASPIRIN 81 MG TABLET    Take 81 mg by mouth daily.    CHOLECALCIFEROL (VITAMIN D) 2000 UNITS TABLET    Take 2,000 Units by mouth daily.    DEXTROMETHORPHAN-QUINIDINE (NUEDEXTA) 20-10 MG CAPS    Take 1 capsule by mouth 2 (two) times daily. Give daily for one week, then increase to twice a day pseudobulbar affect 310.81   DONEPEZIL (ARICEPT) 10 MG TABLET    TAKE 1 TABLET BY MOUTH EVERY DAY FOR MEMORY.   LISINOPRIL (PRINIVIL,ZESTRIL) 10 MG TABLET    TAKE 1 TABLET BY MOUTH EVERY DAY   LORAZEPAM (ATIVAN) 0.5 MG TABLET    TAKE 1 TABLET BY MOUTH EVERY 8 HOURS AS NEEDED FOR ANXIETY   MEMANTINE HCL ER (NAMENDA XR) 28 MG CP24    Take one tablet by mouth once daily to preserve memory   MULTIVITAMIN-IRON-MINERALS-FOLIC ACID (CENTRUM) CHEWABLE TABLET    Chew 1 tablet by mouth daily. Take one tablet  once daily for supplement   NITROFURANTOIN, MACROCRYSTAL-MONOHYDRATE, (MACROBID) 100 MG CAPSULE    Take 1 capsule (100 mg total) by mouth at bedtime.   QUETIAPINE (SEROQUEL) 25 MG TABLET    2 tablet at bedtime for behaviors and sleep   QUETIAPINE (SEROQUEL) 25 MG TABLET    TAKE 1/2 TABLET IN THE AM AND 2 AT BEDTIME FOR BEHAVIORS AND SLEEP.   SENNA (SENOKOT) 8.6 MG TABS TABLET    Take 2 tablets by mouth daily.   SERTRALINE (ZOLOFT) 100 MG TABLET    TAKE 1 TABLET BY MOUTH DAILY ALONG WITH THE 50MG  TABLET   SERTRALINE (ZOLOFT) 50 MG TABLET    TAKE 1 TABLET BY MOUTH DAILY ALONG WITH THE 100MG  TABLET   SIMVASTATIN (ZOCOR) 10 MG TABLET    Take 1 tablet (10 mg total) by mouth at bedtime. For  cholesterol   TROSPIUM (SANCTURA) 20 MG TABLET    Take 20 mg by mouth at bedtime.   Modified Medications   No medications on file  Discontinued Medications   No medications on file     Physical Exam:  Filed Vitals:   10/23/13 1018  BP: 132/76  Pulse: 72  Temp: 97.9 F (36.6 C)  TempSrc: Oral  Weight: 126 lb 12.8 oz (57.516 kg)  SpO2: 96%    Physical Exam  Vitals reviewed. Constitutional: She is well-developed, well-nourished, and in no distress.  HENT:  Head: Normocephalic and atraumatic.  Nose: Nose normal.  Mouth/Throat: Oropharynx is clear and moist. No oropharyngeal exudate.  Eyes: Conjunctivae and EOM are normal. Pupils are equal, round, and reactive to light. Right eye exhibits no discharge. Left eye exhibits no discharge, no exudate and no hordeolum. No foreign body present in the left eye. No scleral icterus.  Redness noted to left lower lid  Neck: Normal range of motion. Neck supple.  Cardiovascular: Normal rate, regular rhythm and normal heart sounds.   Pulmonary/Chest: Effort normal and breath sounds normal. No respiratory distress.  Abdominal: Soft. Bowel sounds are normal. She exhibits no distension. There is no tenderness.  Musculoskeletal: She exhibits no edema and no tenderness.  Neurological: She is alert.  Skin: Skin is warm.  Psychiatric:  Babbles; does not follow all commands or answer questions appropriately     Labs reviewed: Basic Metabolic Panel:  Recent Labs  12/11/12 1409 12/19/12 1210 07/03/13 1140  NA 137 142 141  K 4.4 4.6 3.9  CL 98 101 101  CO2 22 26 22   GLUCOSE 194* 165* 189*  BUN 19 26 10   CREATININE 0.61 0.87 0.64  CALCIUM 8.9 9.2 9.0   Liver Function Tests:  Recent Labs  11/06/12 1425 12/19/12 1210 07/03/13 1140  AST 13 19 11   ALT 12 16 11   ALKPHOS 92 85 93  BILITOT 0.2 0.3 0.3  PROT 6.8 6.8 6.5   No results found for this basename: LIPASE, AMYLASE,  in the last 8760 hours No results found for this basename:  AMMONIA,  in the last 8760 hours CBC:  Recent Labs  11/06/12 1425 12/19/12 1210 07/03/13 1140  WBC 8.2 8.8 7.1  NEUTROABS 5.0 6.1 5.1  HGB 11.9 12.2 12.8  HCT 36.1 38.1 38.6  MCV 90 91 90  PLT  --  190 226   Lipid Panel:  Recent Labs  03/18/13 0933  HDL 47  LDLCALC 114*  TRIG 227*  CHOLHDL 4.4   TSH: No results found for this basename: TSH,  in the last 8760 hours A1C: No components  found with this basename: A1C,    Assessment/Plan 1. Dementia in conditions classified elsewhere with behavioral disturbance(294.11) -conts to take namenda and aricept  -caregiver reports pt doing very well in current setting, walking good able to feed herself but still having crying episodes, ativan given to help which does most the time but did not yesterday -not sleeping at night which may be making behaviors worse, will attempt to increase Seroquel at hs  - QUEtiapine (SEROQUEL) 25 MG tablet; 1/2 tablet (12.5)  in the morning and 3 tablets (75 mg) at bedtime for behaviors and sleep  Dispense: 90 tablet; Refill: 3  2. Insomnia -worse, will increase seroquel to see if this helps  3. Anxiety -related to dementia, has been on zoloft for many years, will attempt to increase to 200 mg daily if this does not help may need titrate off completely due to not working - sertraline (ZOLOFT) 100 MG tablet; Take 2 tablets (200 mg total) by mouth daily.  Dispense: 60 tablet; Refill: 3 - Basic metabolic panel  4. Stye external -went to eye MD- no interevention needed -now with slight redness and edema under eye, will observe as it is not bothering pt at this time, no infection noted  5. Unspecified constipation -on-going, taking multiple OTC and diet modifications -will attempt Linaclotide (LINZESS) 145 MCG CAPS capsule; Take 1 capsule (145 mcg total) by mouth daily.  Dispense: 30 capsule; Refill: 1 - to stop OTC as may cause diarrhea  6. Pseudobulbar affect -neudexta not working, still with  crying out episodes 3-4 times a day -will stop at this time -attempt to increase seroquel and zoloft  -to follow up in 6 weeks on medication changes

## 2013-10-23 NOTE — Patient Instructions (Addendum)
Please note new instructions to zoloft and seroquel STOP Neudexta since it is not working Insurance underwriter as needed  Will get blood work today   Will start on linzess 145 mcg samples provided- to take 30 mins before first meal Do not take with other supplements   Constipation, Adult Constipation is when a person has fewer than 3 bowel movements a week; has difficulty having a bowel movement; or has stools that are dry, hard, or larger than normal. As people grow older, constipation is more common. If you try to fix constipation with medicines that make you have a bowel movement (laxatives), the problem may get worse. Long-term laxative use may cause the muscles of the colon to become weak. A low-fiber diet, not taking in enough fluids, and taking certain medicines may make constipation worse. CAUSES   Certain medicines, such as antidepressants, pain medicine, iron supplements, antacids, and water pills.   Certain diseases, such as diabetes, irritable bowel syndrome (IBS), thyroid disease, or depression.   Not drinking enough water.   Not eating enough fiber-rich foods.   Stress or travel.  Lack of physical activity or exercise.  Not going to the restroom when there is the urge to have a bowel movement.  Ignoring the urge to have a bowel movement.  Using laxatives too much. SYMPTOMS   Having fewer than 3 bowel movements a week.   Straining to have a bowel movement.   Having hard, dry, or larger than normal stools.   Feeling full or bloated.   Pain in the lower abdomen.  Not feeling relief after having a bowel movement. DIAGNOSIS  Your caregiver will take a medical history and perform a physical exam. Further testing may be done for severe constipation. Some tests may include:   A barium enema X-ray to examine your rectum, colon, and sometimes, your small intestine.  A sigmoidoscopy to examine your lower colon.  A colonoscopy to examine your entire colon. TREATMENT   Treatment will depend on the severity of your constipation and what is causing it. Some dietary treatments include drinking more fluids and eating more fiber-rich foods. Lifestyle treatments may include regular exercise. If these diet and lifestyle recommendations do not help, your caregiver may recommend taking over-the-counter laxative medicines to help you have bowel movements. Prescription medicines may be prescribed if over-the-counter medicines do not work.  HOME CARE INSTRUCTIONS   Increase dietary fiber in your diet, such as fruits, vegetables, whole grains, and beans. Limit high-fat and processed sugars in your diet, such as [place] fries, hamburgers, cookies, candies, and soda.   A fiber supplement may be added to your diet if you cannot get enough fiber from foods.   Drink enough fluids to keep your urine clear or pale yellow.   Exercise regularly or as directed by your caregiver.   Go to the restroom when you have the urge to go. Do not hold it.  Only take medicines as directed by your caregiver. Do not take other medicines for constipation without talking to your caregiver first. [place] IF:   You have bright red blood in your stool.   Your constipation lasts for more than 4 days or gets worse.   You have abdominal or rectal pain.   You have thin, pencil-like stools.  You have unexplained weight loss. MAKE SURE YOU:   Understand these instructions.  Will watch your condition.  Will get help right away if you are not doing well or get worse. Document  Released: 02/25/2004 Document Revised: 08/21/2011 Document Reviewed: 03/10/2013 [facility] Patient Information 2014 [place], [place].

## 2013-10-24 LAB — BASIC METABOLIC PANEL
BUN / CREAT RATIO: 22 (ref 11–26)
BUN: 16 mg/dL (ref 8–27)
CALCIUM: 9.5 mg/dL (ref 8.7–10.3)
CHLORIDE: 103 mmol/L (ref 97–108)
CO2: 24 mmol/L (ref 18–29)
CREATININE: 0.74 mg/dL (ref 0.57–1.00)
GFR calc Af Amer: 87 mL/min/{1.73_m2} (ref >59)
GFR calc non Af Amer: 76 mL/min/{1.73_m2} (ref >59)
Glucose: 91 mg/dL (ref 65–99)
Potassium: 4.4 mmol/L (ref 3.5–5.2)
SODIUM: 142 mmol/L (ref 134–144)

## 2013-11-06 ENCOUNTER — Encounter: Payer: Self-pay | Admitting: Nurse Practitioner

## 2013-11-06 ENCOUNTER — Ambulatory Visit (INDEPENDENT_AMBULATORY_CARE_PROVIDER_SITE_OTHER): Payer: Medicare Other | Admitting: Nurse Practitioner

## 2013-11-06 VITALS — BP 132/78 | HR 64 | Resp 10 | Wt 129.0 lb

## 2013-11-06 DIAGNOSIS — H00019 Hordeolum externum unspecified eye, unspecified eyelid: Secondary | ICD-10-CM

## 2013-11-06 MED ORDER — DOXYCYCLINE HYCLATE 100 MG PO TABS: 100.0000 mg | ORAL_TABLET | Freq: Two times a day (BID) | ORAL | Status: DC

## 2013-11-06 NOTE — Patient Instructions (Signed)
To use warm compress 4 times daily to eye  Use baby shampoo twice daily  Will give antibiotic to take twice daily for 1 week  If area does not improve after 1 week or gets worse to follow up with eye doctor   Sty A sty (hordeolum) is an infection of a gland in the eyelid located at the base of the eyelash. A sty may develop a white or yellow head of pus. It can be puffy (swollen). Usually, the sty will burst and pus will come out on its own. They do not leave lumps in the eyelid once they drain. A sty is often confused with another form of cyst of the eyelid called a chalazion. Chalazions occur within the eyelid and not on the edge where the bases of the eyelashes are. They often are red, sore and then form firm lumps in the eyelid. CAUSES   Germs (bacteria).  Lasting (chronic) eyelid inflammation. SYMPTOMS   Tenderness, redness and swelling along the edge of the eyelid at the base of the eyelashes.  Sometimes, there is a white or yellow head of pus. It may or may not drain. DIAGNOSIS  An ophthalmologist will be able to distinguish between a sty and a chalazion and treat the condition appropriately.  TREATMENT   Styes are typically treated with warm packs (compresses) until drainage occurs.  In rare cases, medicines that kill germs (antibiotics) may be prescribed. These antibiotics may be in the form of drops, cream or pills.  If a hard lump has formed, it is generally necessary to do a small incision and remove the hardened contents of the cyst in a minor surgical procedure done in the office.  In suspicious cases, your caregiver may send the contents of the cyst to the lab to be certain that it is not a rare, but dangerous form of cancer of the glands of the eyelid. HOME CARE INSTRUCTIONS   Wash your hands often and dry them with a clean towel. Avoid touching your eyelid. This may spread the infection to other parts of the eye.  Apply heat to your eyelid for 10 to 20 minutes,  several times a day, to ease pain and help to heal it faster.  Do not squeeze the sty. Allow it to drain on its own. Wash your eyelid carefully 3 to 4 times per day to remove any pus. SEEK IMMEDIATE MEDICAL CARE IF:   Your eye becomes painful or puffy (swollen).  Your vision changes.  Your sty does not drain by itself within 3 days.  Your sty comes back within a short period of time, even with treatment.  You have redness (inflammation) around the eye.  You have a fever. Document Released: 03/08/2005 Document Revised: 08/21/2011 Document Reviewed: 11/10/2008 [facility] Patient Information 2014 [place].

## 2013-11-06 NOTE — Progress Notes (Signed)
Patient ID: Charlotte Kelly, female   DOB: 1929-06-27, 80 y.o.   MRN: [id number]    No Known Allergies  Chief Complaint  Patient presents with  . Eye Problem    Raised area on left lower eye lid . Area is worse     HPI: Patient is a 80 y.o. female seen in the office today for raised area on eye, was doing much better after last visit. Using warm compresses with baby shampoo daily. Then aid was off over the weekend and on Monday it was worse. Has not improved. Pt unable to provide any information due to dementia No drainage of eye or eyelid. Redness only noted to lower lid.   Review of Systems:  Unable to obtain due to dementia   Past Medical History  Diagnosis Date  . Arthritis   . Depression   . Cataract   . Dementia in conditions classified elsewhere with behavioral disturbance   . Anxiety   . Insomnia   . Insomnia, unspecified   . Other alteration of consciousness   . Hypertension   . Encounter for screening fecal occult blood testing 09/19/2012  . Alzheimer disease    Past Surgical History  Procedure Laterality Date  . Gallblader    . Small intestine surgery    . Cholecystectomy     Social History:   reports that she has quit smoking. She does not have any smokeless tobacco history on file. She reports that she does not drink alcohol or use illicit drugs.  Family History  Problem Relation Age of Onset  . Heart failure Father     Medications: Patient's Medications  New Prescriptions   No medications on file  Previous Medications   ASPIRIN 81 MG TABLET    Take 81 mg by mouth daily.    DONEPEZIL (ARICEPT) 10 MG TABLET    TAKE 1 TABLET BY MOUTH EVERY DAY FOR MEMORY.   LINACLOTIDE (LINZESS) 145 MCG CAPS CAPSULE    Take 1 capsule (145 mcg total) by mouth daily.   LISINOPRIL (PRINIVIL,ZESTRIL) 10 MG TABLET    TAKE 1 TABLET BY MOUTH EVERY DAY   LORAZEPAM (ATIVAN) 0.5 MG TABLET    TAKE 1 TABLET BY MOUTH EVERY 8 HOURS AS NEEDED FOR ANXIETY   MEMANTINE HCL ER (NAMENDA XR)  28 MG CP24    Take one tablet by mouth once daily to preserve memory   MULTIVITAMIN-IRON-MINERALS-FOLIC ACID (CENTRUM) CHEWABLE TABLET    Chew 1 tablet by mouth daily. Take one tablet once daily for supplement   NITROFURANTOIN, MACROCRYSTAL-MONOHYDRATE, (MACROBID) 100 MG CAPSULE    Take 1 capsule (100 mg total) by mouth at bedtime.   QUETIAPINE (SEROQUEL) 25 MG TABLET    1/2 tablet (12.5)  in the morning and 3 tablets (75 mg) at bedtime for behaviors and sleep   SERTRALINE (ZOLOFT) 100 MG TABLET    TAKE 2 TABLETS BY MOUTH DAILY   SIMVASTATIN (ZOCOR) 10 MG TABLET    Take 1 tablet (10 mg total) by mouth at bedtime. For cholesterol   TROSPIUM (SANCTURA) 20 MG TABLET    Take 20 mg by mouth at bedtime.   Modified Medications   No medications on file  Discontinued Medications   CHOLECALCIFEROL (VITAMIN D) 2000 UNITS TABLET    Take 2,000 Units by mouth daily.    SENNA (SENOKOT) 8.6 MG TABS TABLET    Take 2 tablets by mouth daily.     Physical Exam:  Filed Vitals:   11/06/13 1317  BP: 132/78  Pulse: 64  Resp: 10  Weight: 129 lb (58.514 kg)    Physical Exam  Constitutional:  Frail white female, sits in chair in exam room and babbles the majority of visit  HENT:  Head: Normocephalic.  Right Ear: External ear normal.  Left Ear: External ear normal.  Nose: Nose normal.  Mouth/Throat: Oropharynx is clear and moist.  Eyes: Conjunctivae and EOM are normal. Pupils are equal, round, and reactive to light. Right eye exhibits no discharge, no exudate and no hordeolum. No foreign body present in the right eye. Left eye exhibits hordeolum (noted to left bottom lid). Left eye exhibits no discharge and no exudate. No foreign body present in the left eye. Left conjunctiva is not injected. Left conjunctiva has no hemorrhage.  Neck: Normal range of motion. Neck supple.  Cardiovascular: Normal rate, regular rhythm and normal heart sounds.   Pulmonary/Chest: Breath sounds normal.  Abdominal: Soft. Bowel  sounds are normal.  Neurological: She is alert.  Oriented to self only  Skin: Skin is warm and dry.    Labs reviewed: Basic Metabolic Panel:  Recent Labs  12/19/12 1210 07/03/13 1140 10/23/13 1116  NA 142 141 142  K 4.6 3.9 4.4  CL 101 101 103  CO2 26 22 24   GLUCOSE 165* 189* 91  BUN 26 10 16   CREATININE 0.87 0.64 0.74  CALCIUM 9.2 9.0 9.5   Liver Function Tests:  Recent Labs  11/06/12 1425 12/19/12 1210 07/03/13 1140  AST 13 19 11   ALT 12 16 11   ALKPHOS 92 85 93  BILITOT 0.2 0.3 0.3  PROT 6.8 6.8 6.5   No results found for this basename: LIPASE, AMYLASE,  in the last 8760 hours No results found for this basename: AMMONIA,  in the last 8760 hours CBC:  Recent Labs  11/06/12 1425 12/19/12 1210 07/03/13 1140  WBC 8.2 8.8 7.1  NEUTROABS 5.0 6.1 5.1  HGB 11.9 12.2 12.8  HCT 36.1 38.1 38.6  MCV 90 91 90  PLT  --  190 226   Lipid Panel:  Recent Labs  03/18/13 0933  HDL 47  LDLCALC 114*  TRIG 227*  CHOLHDL 4.4   TSH: No results found for this basename: TSH,  in the last 8760 hours A1C: Lab Results  Component Value Date   HGBA1C 5.9* 03/18/2013    Assessment/Plan 1. Hordeolum externum (stye) To use warm compress 4 times daily to eye  Use baby shampoo twice daily  If area does not improve after 1 week or gets worse to follow up with ophthalmology  - doxycycline (VIBRA-TABS) 100 MG tablet; Take 1 tablet (100 mg total) by mouth 2 (two) times daily.  Dispense: 14 tablet; Refill: 0

## 2013-11-07 ENCOUNTER — Telehealth: Payer: Self-pay | Admitting: *Deleted

## 2013-11-07 NOTE — Telephone Encounter (Signed)
Patient caregiver Notified. 

## 2013-11-07 NOTE — Telephone Encounter (Signed)
Patient caregiver called and stated that patient took her first dose of Doxycycline last night and then took the second dose this morning and vomited. But patient took it on a empty stomach and did not eat.  I told Caregiver to give her the second dose this afternoon after she eats so she will have food on her stomach. Also told her that I would send the message to you to make you aware.

## 2013-11-07 NOTE — Telephone Encounter (Signed)
Yes it should be taken with food to help. thanks

## 2013-11-17 ENCOUNTER — Other Ambulatory Visit: Payer: Self-pay | Admitting: *Deleted

## 2013-11-17 MED ORDER — SERTRALINE HCL 100 MG PO TABS: ORAL_TABLET | ORAL | Status: DC

## 2013-11-17 NOTE — Telephone Encounter (Signed)
Patient husband requested refill.  

## 2013-11-21 ENCOUNTER — Other Ambulatory Visit: Payer: Self-pay | Admitting: Nurse Practitioner

## 2013-11-21 ENCOUNTER — Telehealth: Payer: Self-pay

## 2013-11-21 DIAGNOSIS — R4689 Other symptoms and signs involving appearance and behavior: Secondary | ICD-10-CM

## 2013-11-21 DIAGNOSIS — N390 Urinary tract infection, site not specified: Secondary | ICD-10-CM

## 2013-11-21 NOTE — Telephone Encounter (Signed)
Greig Castilla (patient's caregiver) called triage line indicating patient with violent/aggressive behavior x 2 weeks (much worse than usual). Patient  is yelling, hitting and screaming. Patient with a history of UTI's. I offered for patient to be seen today by Dr.Reed at 11:15, Ms.Wright stated patient is unable to come in today. Ms.Wright would like for patient to have a nun hat and urine cup to collect urine sample (husband will pick-up today) and drop off on Monday.   I placed requested material at the front desk for pick-up. Patient to be seen at Urgent Care or the ER if symptoms persist or progress. Message will be forwarded to the covering provider as a FYI.

## 2013-11-21 NOTE — Telephone Encounter (Signed)
Noted  

## 2013-11-24 ENCOUNTER — Other Ambulatory Visit: Payer: Medicare Other

## 2013-11-24 DIAGNOSIS — N390 Urinary tract infection, site not specified: Secondary | ICD-10-CM | POA: Diagnosis not present

## 2013-11-24 DIAGNOSIS — R4689 Other symptoms and signs involving appearance and behavior: Secondary | ICD-10-CM

## 2013-11-24 DIAGNOSIS — Z789 Other specified health status: Secondary | ICD-10-CM | POA: Diagnosis not present

## 2013-11-25 LAB — URINALYSIS
Bilirubin, UA: NEGATIVE
GLUCOSE, UA: NEGATIVE
KETONES UA: NEGATIVE
NITRITE UA: NEGATIVE
Protein, UA: NEGATIVE
RBC, UA: NEGATIVE
SPEC GRAV UA: 1.019 (ref 1.005–1.030)
Urobilinogen, Ur: 0.2 mg/dL (ref 0.0–1.9)
pH, UA: 6 (ref 5.0–7.5)

## 2013-11-25 LAB — URINE CULTURE

## 2013-11-26 ENCOUNTER — Other Ambulatory Visit: Payer: Self-pay | Admitting: Nurse Practitioner

## 2013-12-04 ENCOUNTER — Encounter: Payer: Self-pay | Admitting: Nurse Practitioner

## 2013-12-04 ENCOUNTER — Ambulatory Visit (INDEPENDENT_AMBULATORY_CARE_PROVIDER_SITE_OTHER): Payer: Medicare Other | Admitting: Nurse Practitioner

## 2013-12-04 VITALS — BP 120/70 | HR 78 | Temp 98.3°F | Resp 18 | Wt 132.6 lb

## 2013-12-04 DIAGNOSIS — H00019 Hordeolum externum unspecified eye, unspecified eyelid: Secondary | ICD-10-CM | POA: Diagnosis not present

## 2013-12-04 DIAGNOSIS — F0281 Dementia in other diseases classified elsewhere with behavioral disturbance: Secondary | ICD-10-CM | POA: Diagnosis not present

## 2013-12-04 DIAGNOSIS — F02818 Dementia in other diseases classified elsewhere, unspecified severity, with other behavioral disturbance: Secondary | ICD-10-CM

## 2013-12-04 DIAGNOSIS — K5900 Constipation, unspecified: Secondary | ICD-10-CM

## 2013-12-04 DIAGNOSIS — F411 Generalized anxiety disorder: Secondary | ICD-10-CM | POA: Diagnosis not present

## 2013-12-04 DIAGNOSIS — H00016 Hordeolum externum left eye, unspecified eyelid: Secondary | ICD-10-CM

## 2013-12-04 DIAGNOSIS — F419 Anxiety disorder, unspecified: Secondary | ICD-10-CM

## 2013-12-04 DIAGNOSIS — G4700 Insomnia, unspecified: Secondary | ICD-10-CM

## 2013-12-04 MED ORDER — VORTIOXETINE HBR 10 MG PO TABS: ORAL_TABLET | ORAL | Status: DC

## 2013-12-04 MED ORDER — LORAZEPAM 1 MG PO TABS: ORAL_TABLET | ORAL | Status: DC

## 2013-12-04 NOTE — Progress Notes (Signed)
Patient ID: Charlotte Kelly, female   DOB: 1930/02/11, 80 y.o.   MRN: [id number]    No Known Allergies  Chief Complaint  Patient presents with  . Follow-up    HPI: Patient is a 80 y.o. female seen in the office today for routine follow up.  Stye has resolved with doxycyline and baby shampoo  Constipation improved with linzess to every day now she is going every other day.   Behaviors are worse- increase aggression and agitation, getting angry with people at the living facility Had urine tested which was negative for UTI Noticed worsening behaviors with increase in zoloft (but was having worsening behaviors before this was increased)  Pt sleeping better at night one 75 mg of Seroquel, however 1/2 tablet in the am makes her gait slightly off- no falls, but helps her mood  Review of Systems: provided by caregiver and husband  Review of Systems  Constitutional: Negative for fever, chills, weight loss and malaise/fatigue.  Respiratory: Negative for shortness of breath.   Cardiovascular: Negative for chest pain.  Gastrointestinal: Negative for heartburn, nausea, vomiting, abdominal pain, diarrhea and constipation.  Genitourinary: Negative for dysuria.  Musculoskeletal: Negative for joint pain and myalgias.  Skin: Negative.   Neurological: Negative for weakness.  Psychiatric/Behavioral: Positive for memory loss. The patient is nervous/anxious and has insomnia.      Past Medical History  Diagnosis Date  . Arthritis   . Depression   . Cataract   . Dementia in conditions classified elsewhere with behavioral disturbance   . Anxiety   . Insomnia   . Insomnia, unspecified   . Other alteration of consciousness   . Hypertension   . Encounter for screening fecal occult blood testing 09/19/2012  . Alzheimer disease    Past Surgical History  Procedure Laterality Date  . Gallblader    . Small intestine surgery    . Cholecystectomy     Social History:   reports that she has quit  smoking. She does not have any smokeless tobacco history on file. She reports that she does not drink alcohol or use illicit drugs.  Family History  Problem Relation Age of Onset  . Heart failure Father     Medications: Patient's Medications  New Prescriptions   No medications on file  Previous Medications   ASPIRIN 81 MG TABLET    Take 81 mg by mouth daily.    DONEPEZIL (ARICEPT) 10 MG TABLET    TAKE 1 TABLET BY MOUTH EVERY DAY FOR MEMORY.   LINACLOTIDE (LINZESS) 145 MCG CAPS CAPSULE    Take 1 capsule (145 mcg total) by mouth daily.   LISINOPRIL (PRINIVIL,ZESTRIL) 10 MG TABLET    TAKE 1 TABLET BY MOUTH EVERY DAY   LORAZEPAM (ATIVAN) 0.5 MG TABLET    TAKE 1 TABLET BY MOUTH EVERY 8 HOURS AS NEEDED FOR ANXIETY.   MEMANTINE HCL ER (NAMENDA XR) 28 MG CP24    Take one tablet by mouth once daily to preserve memory   MULTIVITAMIN-IRON-MINERALS-FOLIC ACID (CENTRUM) CHEWABLE TABLET    Chew 1 tablet by mouth daily. Take one tablet once daily for supplement   NITROFURANTOIN, MACROCRYSTAL-MONOHYDRATE, (MACROBID) 100 MG CAPSULE    Take 1 capsule (100 mg total) by mouth at bedtime.   QUETIAPINE (SEROQUEL) 25 MG TABLET    1/2 tablet (12.5)  in the morning and 3 tablets (75 mg) at bedtime for behaviors and sleep   SERTRALINE (ZOLOFT) 100 MG TABLET    Take two tablets by mouth once daily  for depression   SIMVASTATIN (ZOCOR) 10 MG TABLET    Take 1 tablet (10 mg total) by mouth at bedtime. For cholesterol   TROSPIUM (SANCTURA) 20 MG TABLET    Take 20 mg by mouth at bedtime.   Modified Medications   No medications on file  Discontinued Medications   DOXYCYCLINE (VIBRA-TABS) 100 MG TABLET    Take 1 tablet (100 mg total) by mouth 2 (two) times daily.     Physical Exam:  Filed Vitals:   12/04/13 1014  BP: 120/70  Pulse: 78  Temp: 98.3 F (36.8 C)  TempSrc: Oral  Resp: 18  Weight: 132 lb 9.6 oz (60.147 kg)  SpO2: 96%    Physical Exam  Vitals reviewed. Constitutional: She is well-developed,  well-nourished, and in no distress.  HENT:  Head: Normocephalic and atraumatic.  Nose: Nose normal.  Mouth/Throat: Oropharynx is clear and moist. No oropharyngeal exudate.  Eyes: Conjunctivae and EOM are normal. Pupils are equal, round, and reactive to light. Right eye exhibits no discharge. Left eye exhibits no discharge, no exudate and no hordeolum. No foreign body present in the left eye. No scleral icterus.  Neck: Normal range of motion. Neck supple.  Cardiovascular: Normal rate, regular rhythm and normal heart sounds.   Pulmonary/Chest: Effort normal and breath sounds normal. No respiratory distress.  Abdominal: Soft. Bowel sounds are normal. She exhibits no distension. There is no tenderness.  Musculoskeletal: She exhibits no edema and no tenderness.  Neurological: She is alert.  Skin: Skin is warm.  Psychiatric:  Babbles; does not follow all commands or answer questions appropriately Walks around room during exam     Labs reviewed: Basic Metabolic Panel:  Recent Labs  12/19/12 1210 07/03/13 1140 10/23/13 1116  NA 142 141 142  K 4.6 3.9 4.4  CL 101 101 103  CO2 26 22 24   GLUCOSE 165* 189* 91  BUN 26 10 16   CREATININE 0.87 0.64 0.74  CALCIUM 9.2 9.0 9.5   Liver Function Tests:  Recent Labs  12/19/12 1210 07/03/13 1140  AST 19 11  ALT 16 11  ALKPHOS 85 93  BILITOT 0.3 0.3  PROT 6.8 6.5   No results found for this basename: LIPASE, AMYLASE,  in the last 8760 hours No results found for this basename: AMMONIA,  in the last 8760 hours CBC:  Recent Labs  12/19/12 1210 07/03/13 1140  WBC 8.8 7.1  NEUTROABS 6.1 5.1  HGB 12.2 12.8  HCT 38.1 38.6  MCV 91 90  PLT 190 226   Lipid Panel:  Recent Labs  03/18/13 0933  HDL 47  LDLCALC 114*  TRIG 227*  CHOLHDL 4.4   TSH: No results found for this basename: TSH,  in the last 8760 hours A1C: Lab Results  Component Value Date   HGBA1C 5.9* 03/18/2013     Assessment/Plan 1. Dementia in conditions  classified elsewhere with behavioral disturbance(294.11) -behaviors have worsen, possible related to worsening anxiety, daughter does not want pt to be on mood stabilizer or antipsychotic if possible; Seroquel helping so will cont current dose of this. Caregiver providing support and assistance with gait if needed no recent falls, still walking without assistive device.  -will change anxiety medication to see if this helps mood and behaviors   2. Anxiety -worse, will titrate zoloft off and start brintellix- samples provided  -will increase ativan to 1 mg to see if this helps improve anxiety and agitation  - Vortioxetine HBr (BRINTELLIX) 10 MG TABS; 5 mg  daily for 1 week then increase to 10 mg daily  Dispense: 30 tablet - LORazepam (ATIVAN) 1 MG tablet; 1 tablet every 8 hours as needed for anxiety  Dispense: 90 tablet; Refill: 0 3. Stye external, left Resolved, conts warm compress with baby shampoo wash daily  4. Unspecified constipation -improved on linzess  5. Insomnia -has improved with increase in seroquell  Follow up anxiety and behaviors in 6-7 weeks  or sooner if needed

## 2013-12-04 NOTE — Patient Instructions (Signed)
Will titrate zoloft off, decrease zoloft to 150 mg for 7 days, then 100 mg for 7 days, then 50 for 7 days then stop Once she has gotten to  zoloft 50 mg start Brinktellix 5 mg for 7 days, then 10 mg for 14 days   Will increase increase ativan to 1 mg q 8 hours as needed   Follow up in 5 weeks

## 2013-12-23 ENCOUNTER — Ambulatory Visit (INDEPENDENT_AMBULATORY_CARE_PROVIDER_SITE_OTHER): Payer: Medicare Other | Admitting: Internal Medicine

## 2013-12-23 ENCOUNTER — Encounter: Payer: Self-pay | Admitting: Internal Medicine

## 2013-12-23 VITALS — BP 122/78 | HR 69 | Resp 10 | Wt 132.0 lb

## 2013-12-23 DIAGNOSIS — F02818 Dementia in other diseases classified elsewhere, unspecified severity, with other behavioral disturbance: Secondary | ICD-10-CM | POA: Diagnosis not present

## 2013-12-23 DIAGNOSIS — F0281 Dementia in other diseases classified elsewhere with behavioral disturbance: Secondary | ICD-10-CM | POA: Diagnosis not present

## 2013-12-23 DIAGNOSIS — L03119 Cellulitis of unspecified part of limb: Secondary | ICD-10-CM

## 2013-12-23 DIAGNOSIS — L03115 Cellulitis of right lower limb: Secondary | ICD-10-CM

## 2013-12-23 DIAGNOSIS — L02419 Cutaneous abscess of limb, unspecified: Secondary | ICD-10-CM

## 2013-12-23 DIAGNOSIS — F411 Generalized anxiety disorder: Secondary | ICD-10-CM | POA: Diagnosis not present

## 2013-12-23 DIAGNOSIS — F419 Anxiety disorder, unspecified: Secondary | ICD-10-CM

## 2013-12-23 MED ORDER — SACCHAROMYCES BOULARDII 250 MG PO CAPS: 250.0000 mg | ORAL_CAPSULE | Freq: Two times a day (BID) | ORAL | Status: DC

## 2013-12-23 MED ORDER — DOXYCYCLINE HYCLATE 100 MG PO TABS: 100.0000 mg | ORAL_TABLET | Freq: Two times a day (BID) | ORAL | Status: DC

## 2013-12-23 MED ORDER — VORTIOXETINE HBR 10 MG PO TABS: ORAL_TABLET | ORAL | Status: DC

## 2013-12-23 NOTE — Patient Instructions (Signed)
Do not take macrobid while taking your new antibiotics doxycycline. You can resume it after completing your doxycyline.  Clean wound area with normal saline and sterile gauze once a day and apply non adherent dressing while outside

## 2013-12-23 NOTE — Progress Notes (Signed)
Patient ID: Charlotte Kelly, female   DOB: 10-09-1929, 80 y.o.   MRN: [id number]    Chief Complaint  Patient presents with  . Acute Visit    Examine skin tear on right lower leg (front) x 1 week. Cause Unknown    No Known Allergies  HPI 80 y/o female patient is here for acute visit. She is here with her husband and caregiver. She has a skin tear and sore on her right leg noticed for a week now and it is getting worse. Unable to mention how the patient got it. No falls or trauma recalled. Patient has severe dementia and this limits her history taking or obtaining ROS.  No recent fever or chills Patient does not complaint of pain/ discomfort No witnessed fall Memory continues to decline with behavioral changes. Patient intermittently touches the skin and try to pick on it She gets around with walker and wheelchair  ROS See above  Past Medical History  Diagnosis Date  . Arthritis   . Depression   . Cataract   . Dementia in conditions classified elsewhere with behavioral disturbance   . Anxiety   . Insomnia   . Insomnia, unspecified   . Other alteration of consciousness   . Hypertension   . Encounter for screening fecal occult blood testing 09/19/2012  . Alzheimer disease    Current Outpatient Prescriptions on File Prior to Visit  Medication Sig Dispense Refill  . aspirin 81 MG tablet Take 81 mg by mouth daily.       Marland Kitchen donepezil (ARICEPT) 10 MG tablet TAKE 1 TABLET BY MOUTH EVERY DAY FOR MEMORY.  90 tablet  1  . Linaclotide (LINZESS) 145 MCG CAPS capsule Take 1 capsule (145 mcg total) by mouth daily.  30 capsule  1  . lisinopril (PRINIVIL,ZESTRIL) 10 MG tablet TAKE 1 TABLET BY MOUTH EVERY DAY  90 tablet  0  . LORazepam (ATIVAN) 1 MG tablet 1 tablet every 8 hours as needed for anxiety  90 tablet  0  . Memantine HCl ER (NAMENDA XR) 28 MG CP24 Take one tablet by mouth once daily to preserve memory  90 capsule  3  . multivitamin-iron-minerals-folic acid (CENTRUM) chewable tablet Chew 1  tablet by mouth daily. Take one tablet once daily for supplement      . nitrofurantoin, macrocrystal-monohydrate, (MACROBID) 100 MG capsule Take 1 capsule (100 mg total) by mouth at bedtime.  30 capsule  4  . QUEtiapine (SEROQUEL) 25 MG tablet 1/2 tablet (12.5)  in the morning and 3 tablets (75 mg) at bedtime for behaviors and sleep  90 tablet  3  . simvastatin (ZOCOR) 10 MG tablet Take 1 tablet (10 mg total) by mouth at bedtime. For cholesterol  90 tablet  3  . trospium (SANCTURA) 20 MG tablet Take 20 mg by mouth at bedtime.        No current facility-administered medications on file prior to visit.   Physical exam BP 122/78  Pulse 69  Resp 10  Wt 132 lb (59.875 kg)  SpO2 95%  Constitutional: She is well-developed, well-nourished, and in no distress.  HENT:   Head: Normocephalic and atraumatic.   Neck: Normal range of motion. Neck supple.   Musculoskeletal: She exhibits no edema and no tenderness.  Neurological: She is alert.  Skin: Skin is warm and erythematous on right anterior lower leg with tenderness illicted on exam and has yellowish brown drainage, good posterior tibialis Psychiatric: alert but not oriented, tries to get up her  wheelchair, answers few basic questions, does not participate much in conversation  Labs- Lab Results  Component Value Date   WBC 7.1 07/03/2013   HGB 12.8 07/03/2013   HCT 38.6 07/03/2013   MCV 90 07/03/2013   PLT 226 07/03/2013   CMP     Component Value Date/Time   NA 142 10/23/2013 1116   K 4.4 10/23/2013 1116   CL 103 10/23/2013 1116   CO2 24 10/23/2013 1116   GLUCOSE 91 10/23/2013 1116   BUN 16 10/23/2013 1116   CREATININE 0.74 10/23/2013 1116   CALCIUM 9.5 10/23/2013 1116   PROT 6.5 07/03/2013 1140   AST 11 07/03/2013 1140   ALT 11 07/03/2013 1140   ALKPHOS 93 07/03/2013 1140   BILITOT 0.3 07/03/2013 1140   GFRNONAA 76 10/23/2013 1116   GFRAA 87 10/23/2013 1116   Assessment/plan  1. Anxiety Stable, continue current regimen, no changes  made  2. Cellulitis of leg, right Will treat her empirically with doxycycline 100 mg bid for 10 days. Also to take florastor 250 mg bid for 2 weeks. Care taker to clean affected area with normal saline and sterile gauze once a day. Can apply non adherent dressing while outside of her room. Reassess in a week or sooner if no imporvement or has fever, chills or worsening of current skin lesion. Patient to hold her macrobid for now until completion of doxycyline course - doxycycline (VIBRA-TABS) 100 MG tablet; Take 1 tablet (100 mg total) by mouth 2 (two) times daily.  Dispense: 20 tablet; Refill: 0 - saccharomyces boulardii (FLORASTOR) 250 MG capsule; Take 1 capsule (250 mg total) by mouth 2 (two) times daily.  Dispense: 30 capsule; Refill: 0  3. Dementia in conditions classified elsewhere with behavioral disturbance(294.11) Continue aricept and namenda Handicap placard provided Her memory decline is anticipated and this limits her ability of following commands which might interfere with her wound healing and prevention of further trauma to already affected area - Vortioxetine HBr (BRINTELLIX) 10 MG TABS; 5 mg daily for 1 week then increase to 10 mg daily  Dispense: 30 tablet; Refill: 5

## 2013-12-24 ENCOUNTER — Ambulatory Visit: Payer: Medicare Other | Admitting: Internal Medicine

## 2013-12-24 ENCOUNTER — Other Ambulatory Visit: Payer: Self-pay | Admitting: Internal Medicine

## 2013-12-24 ENCOUNTER — Other Ambulatory Visit: Payer: Self-pay | Admitting: Nurse Practitioner

## 2013-12-29 ENCOUNTER — Other Ambulatory Visit: Payer: Self-pay | Admitting: *Deleted

## 2013-12-29 DIAGNOSIS — F0281 Dementia in other diseases classified elsewhere with behavioral disturbance: Secondary | ICD-10-CM

## 2013-12-29 DIAGNOSIS — F02818 Dementia in other diseases classified elsewhere, unspecified severity, with other behavioral disturbance: Secondary | ICD-10-CM

## 2013-12-29 MED ORDER — VORTIOXETINE HBR 10 MG PO TABS: ORAL_TABLET | ORAL | Status: DC

## 2013-12-29 NOTE — Telephone Encounter (Signed)
Patient husband stopped by and wanted a 90 supply of Brintellix instead of 30. He stated that he called his insurance company and it is covered. A 30 day supply costs $25 and a 90 supply costs $50. A savings of $25. I called [facility] and they stated that he could come back by and they would add 60 more pills to the 30 he has. Patient agreed.

## 2014-01-01 ENCOUNTER — Ambulatory Visit (INDEPENDENT_AMBULATORY_CARE_PROVIDER_SITE_OTHER): Payer: Medicare Other | Admitting: Nurse Practitioner

## 2014-01-01 ENCOUNTER — Encounter: Payer: Self-pay | Admitting: Nurse Practitioner

## 2014-01-01 VITALS — BP 130/70 | HR 78 | Temp 97.4°F | Resp 18 | Ht 60.0 in | Wt 129.8 lb

## 2014-01-01 DIAGNOSIS — L03119 Cellulitis of unspecified part of limb: Secondary | ICD-10-CM | POA: Diagnosis not present

## 2014-01-01 DIAGNOSIS — L02419 Cutaneous abscess of limb, unspecified: Secondary | ICD-10-CM

## 2014-01-01 DIAGNOSIS — L03115 Cellulitis of right lower limb: Secondary | ICD-10-CM

## 2014-01-01 NOTE — Progress Notes (Signed)
Patient ID: Charlotte Kelly, female   DOB: March 19, 1930, 80 y.o.   MRN: [id number]    No Known Allergies  Chief Complaint  Patient presents with  . Follow-up    HPI: Patient is a 80 y.o. female seen in the office today for follow up on right leg cellulitis. Here today to follow up cellulitis with caregivers and husband- much better finishes doxycycline tonight. Redness and swelling improved.  Skin tear scabbed over.  Behaviors still bad but daughter does not want to over medicate patient.  No fevers or chills Review of Systems:  See HPI, pt unable to provide history   Past Medical History  Diagnosis Date  . Arthritis   . Depression   . Cataract   . Dementia in conditions classified elsewhere with behavioral disturbance   . Anxiety   . Insomnia   . Insomnia, unspecified   . Other alteration of consciousness   . Hypertension   . Encounter for screening fecal occult blood testing 09/19/2012  . Alzheimer disease    Past Surgical History  Procedure Laterality Date  . Gallblader    . Small intestine surgery    . Cholecystectomy     Social History:   reports that she has quit smoking. She does not have any smokeless tobacco history on file. She reports that she does not drink alcohol or use illicit drugs.  Family History  Problem Relation Age of Onset  . Heart failure Father     Medications: Patient's Medications  New Prescriptions   No medications on file  Previous Medications   ASPIRIN 81 MG TABLET    Take 81 mg by mouth daily.    DONEPEZIL (ARICEPT) 10 MG TABLET    TAKE 1 TABLET BY MOUTH EVERY DAY FOR MEMORY.   DOXYCYCLINE (VIBRA-TABS) 100 MG TABLET    Take 1 tablet (100 mg total) by mouth 2 (two) times daily.   LINZESS 145 MCG CAPS CAPSULE    TAKE 1 CAPSULE BY MOUTH EVERY DAY   LISINOPRIL (PRINIVIL,ZESTRIL) 10 MG TABLET    TAKE 1 TABLET BY MOUTH EVERY DAY.   LORAZEPAM (ATIVAN) 1 MG TABLET    1 tablet every 8 hours as needed for anxiety   MEMANTINE HCL ER (NAMENDA XR) 28  MG CP24    Take one tablet by mouth once daily to preserve memory   NITROFURANTOIN, MACROCRYSTAL-MONOHYDRATE, (MACROBID) 100 MG CAPSULE    Take 1 capsule (100 mg total) by mouth at bedtime.   QUETIAPINE (SEROQUEL) 25 MG TABLET    1/2 tablet (12.5)  in the morning and 3 tablets (75 mg) at bedtime for behaviors and sleep   SACCHAROMYCES BOULARDII (FLORASTOR) 250 MG CAPSULE    Take 1 capsule (250 mg total) by mouth 2 (two) times daily.   SIMVASTATIN (ZOCOR) 10 MG TABLET    Take 1 tablet (10 mg total) by mouth at bedtime. For cholesterol   TROSPIUM (SANCTURA) 20 MG TABLET    Take 20 mg by mouth at bedtime.    VORTIOXETINE HBR (BRINTELLIX) 10 MG TABS    5 mg daily for 1 week then increase to 10 mg daily  Modified Medications   No medications on file  Discontinued Medications   MULTIVITAMIN-IRON-MINERALS-FOLIC ACID (CENTRUM) CHEWABLE TABLET    Chew 1 tablet by mouth daily. Take one tablet once daily for supplement     Physical Exam:  Filed Vitals:   01/01/14 1531  BP: 130/70  Pulse: 78  Temp: 97.4 F (36.3 C)  TempSrc:  Oral  Resp: 18  Height: 5' (1.524 m)  Weight: 129 lb 12.8 oz (58.877 kg)  SpO2: 90%    Constitutional: She is well-developed, well-nourished, and in no distress.  HENT:  Head: Normocephalic and atraumatic.  Neck: Normal range of motion. Neck supple.  Musculoskeletal: She exhibits no edema and no tenderness.  Neurological: She is alert.  Skin: Skin with minimal erythema on right anterior lower leg without tenderness, no drainage.  Psychiatric: alert but not oriented,walks around exam room  Labs reviewed: Basic Metabolic Panel:  Recent Labs  07/03/13 1140 10/23/13 1116  NA 141 142  K 3.9 4.4  CL 101 103  CO2 22 24  GLUCOSE 189* 91  BUN 10 16  CREATININE 0.64 0.74  CALCIUM 9.0 9.5   Liver Function Tests:  Recent Labs  07/03/13 1140  AST 11  ALT 11  ALKPHOS 93  BILITOT 0.3  PROT 6.5   No results found for this basename: LIPASE, AMYLASE,  in the  last 8760 hours No results found for this basename: AMMONIA,  in the last 8760 hours CBC:  Recent Labs  07/03/13 1140  WBC 7.1  NEUTROABS 5.1  HGB 12.8  HCT 38.6  MCV 90  PLT 226   Lipid Panel:  Recent Labs  03/18/13 0933  HDL 47  LDLCALC 114*  TRIG 227*  CHOLHDL 4.4   TSH: No results found for this basename: TSH,  in the last 8760 hours A1C: Lab Results  Component Value Date   HGBA1C 5.9* 03/18/2013     Assessment/Plan  Cellulitis of leg, right Has improved, finishes doxycyline today Caregivers to provide on-going monitoring of right LE and notify for worsening of skin tear, redness or drainage occurs

## 2014-01-01 NOTE — Patient Instructions (Signed)
Cont current medications, please call and make appt sooner if needed for behaviors

## 2014-01-09 ENCOUNTER — Other Ambulatory Visit: Payer: Self-pay | Admitting: Internal Medicine

## 2014-01-22 ENCOUNTER — Encounter: Payer: Self-pay | Admitting: Nurse Practitioner

## 2014-01-22 ENCOUNTER — Ambulatory Visit (INDEPENDENT_AMBULATORY_CARE_PROVIDER_SITE_OTHER): Payer: Medicare Other | Admitting: Nurse Practitioner

## 2014-01-22 VITALS — BP 120/74 | HR 75 | Temp 97.9°F | Wt 130.8 lb

## 2014-01-22 DIAGNOSIS — F0281 Dementia in other diseases classified elsewhere with behavioral disturbance: Secondary | ICD-10-CM | POA: Diagnosis not present

## 2014-01-22 DIAGNOSIS — F02818 Dementia in other diseases classified elsewhere, unspecified severity, with other behavioral disturbance: Secondary | ICD-10-CM

## 2014-01-22 DIAGNOSIS — K5900 Constipation, unspecified: Secondary | ICD-10-CM

## 2014-01-22 DIAGNOSIS — F411 Generalized anxiety disorder: Secondary | ICD-10-CM | POA: Diagnosis not present

## 2014-01-22 DIAGNOSIS — I10 Essential (primary) hypertension: Secondary | ICD-10-CM

## 2014-01-22 DIAGNOSIS — F419 Anxiety disorder, unspecified: Secondary | ICD-10-CM

## 2014-01-22 MED ORDER — MEMANTINE HCL-DONEPEZIL HCL ER 28-10 MG PO CP24: 1.0000 | ORAL_CAPSULE | Freq: Every day | ORAL | Status: DC

## 2014-01-22 NOTE — Progress Notes (Signed)
Patient ID: Charlotte Kelly, female   DOB: 02/15/1930, 80 y.o.   MRN: [id number]    No Known Allergies  Chief Complaint  Patient presents with  . Follow-up    7 week f/u medication, sypmtoms have gotten beter  . other    changed Nemenda & Aricept to Nemzeric (informatio given to pt)    HPI: Patient is a 80 y.o. female seen in the office today for follow up on chronic conditions. Pt with hx of dementia with behaviors. Was started on brintellix which has helped crying spells. Behaviors have increased (caregiver shows bruises on her) pt is hard to redirect. Did not tolerate depakote; seroquel helping some.   Right skin leg wound still healing, pt conts to pick at scabs. Nails are kept clean and filled but still able to dig into wounds.   Caregiver and husband do not report any new concerns    Review of Systems:  See HPI, pt unable to provide history   Past Medical History  Diagnosis Date  . Arthritis   . Depression   . Cataract   . Dementia in conditions classified elsewhere with behavioral disturbance   . Anxiety   . Insomnia   . Insomnia, unspecified   . Other alteration of consciousness   . Hypertension   . Encounter for screening fecal occult blood testing 09/19/2012  . Alzheimer disease    Past Surgical History  Procedure Laterality Date  . Gallblader    . Small intestine surgery    . Cholecystectomy     Social History:   reports that she has quit smoking. She does not have any smokeless tobacco history on file. She reports that she does not drink alcohol or use illicit drugs.  Family History  Problem Relation Age of Onset  . Heart failure Father     Medications: Patient's Medications  New Prescriptions   No medications on file  Previous Medications   ASPIRIN 81 MG TABLET    Take 81 mg by mouth daily.    LINZESS 145 MCG CAPS CAPSULE    TAKE 1 CAPSULE BY MOUTH EVERY DAY   LISINOPRIL (PRINIVIL,ZESTRIL) 10 MG TABLET    TAKE 1 TABLET BY MOUTH EVERY DAY.   LORAZEPAM  (ATIVAN) 1 MG TABLET    1 tablet every 8 hours as needed for anxiety   NITROFURANTOIN, MACROCRYSTAL-MONOHYDRATE, (MACROBID) 100 MG CAPSULE    TAKE 1 CAPSULE BY MOUTH EVERY NIGHT AT BEDTIME   QUETIAPINE (SEROQUEL) 25 MG TABLET    1/2 tablet (12.5)  in the morning and 3 tablets (75 mg) at bedtime for behaviors and sleep   SIMVASTATIN (ZOCOR) 10 MG TABLET    Take 1 tablet (10 mg total) by mouth at bedtime. For cholesterol   TROSPIUM (SANCTURA) 20 MG TABLET    Take 20 mg by mouth at bedtime.   Modified Medications   Modified Medication Previous Medication   MEMANTINE HCL-DONEPEZIL HCL (NAMZARIC) 28-10 MG CP24 Memantine HCl-Donepezil HCl (NAMZARIC) 28-10 MG CP24      Take 1 tablet by mouth daily. For memory    Take 1 tablet by mouth daily. For memory   VORTIOXETINE HBR 10 MG TABS Vortioxetine HBr (BRINTELLIX) 10 MG TABS      Take  10 mg by mouth daily    5 mg daily for 1 week then increase to 10 mg daily  Discontinued Medications   DONEPEZIL (ARICEPT) 10 MG TABLET    TAKE 1 TABLET BY MOUTH EVERY DAY FOR MEMORY.  DOXYCYCLINE (VIBRA-TABS) 100 MG TABLET    Take 1 tablet (100 mg total) by mouth 2 (two) times daily.   MEMANTINE HCL ER (NAMENDA XR) 28 MG CP24    Take one tablet by mouth once daily to preserve memory   NITROFURANTOIN, MACROCRYSTAL-MONOHYDRATE, (MACROBID) 100 MG CAPSULE    Take 1 capsule (100 mg total) by mouth at bedtime.   SACCHAROMYCES BOULARDII (FLORASTOR) 250 MG CAPSULE    Take 1 capsule (250 mg total) by mouth 2 (two) times daily.     Physical Exam:  Filed Vitals:   01/22/14 1059  BP: 120/74  Pulse: 75  Temp: 97.9 F (36.6 C)  TempSrc: Oral  Weight: 130 lb 12.8 oz (59.33 kg)  SpO2: 97%    Physical Exam  Vitals reviewed. Constitutional: She is well-developed, well-nourished, and in no distress.  HENT:  Head: Normocephalic and atraumatic.  Nose: Nose normal.  Mouth/Throat: Oropharynx is clear and moist. No oropharyngeal exudate.  Eyes: Conjunctivae and EOM are normal.  Pupils are equal, round, and reactive to light. Left eye exhibits no exudate and no hordeolum. No foreign body present in the left eye.  Cardiovascular: Normal rate, regular rhythm and normal heart sounds.   Pulmonary/Chest: Effort normal and breath sounds normal. No respiratory distress.  Abdominal: Soft. Bowel sounds are normal.  Musculoskeletal: She exhibits no edema and no tenderness.  Neurological: She is alert.  Skin: Skin is warm.  Psychiatric:  Babbles; does not follow all commands or answer questions appropriately     Labs reviewed: Basic Metabolic Panel:  Recent Labs  07/03/13 1140 10/23/13 1116  NA 141 142  K 3.9 4.4  CL 101 103  CO2 22 24  GLUCOSE 189* 91  BUN 10 16  CREATININE 0.64 0.74  CALCIUM 9.0 9.5   Liver Function Tests:  Recent Labs  07/03/13 1140  AST 11  ALT 11  ALKPHOS 93  BILITOT 0.3  PROT 6.5   No results found for this basename: LIPASE, AMYLASE,  in the last 8760 hours No results found for this basename: AMMONIA,  in the last 8760 hours CBC:  Recent Labs  07/03/13 1140  WBC 7.1  NEUTROABS 5.1  HGB 12.8  HCT 38.6  MCV 90  PLT 226   Lipid Panel:  Recent Labs  03/18/13 0933  HDL 47  LDLCALC 114*  TRIG 227*  CHOLHDL 4.4   TSH: No results found for this basename: TSH,  in the last 8760 hours A1C: Lab Results  Component Value Date   HGBA1C 5.9* 03/18/2013     Assessment/Plan  1. Essential hypertension -controlled on lisinopril  2. Dementia in conditions classified elsewhere with behavioral disturbance(294.11) -will change medication from namenda and aricept to namzaric  -behaviors cont, does benefit from Seroquel   3. Unspecified constipation -has improved on linzess, will cont   4. Anxiety -has improved with brintellix, will cont medication

## 2014-01-22 NOTE — Patient Instructions (Signed)
Cont current medications, follow up in 4 months with Dr Mariea Clonts

## 2014-01-23 ENCOUNTER — Other Ambulatory Visit: Payer: Self-pay | Admitting: *Deleted

## 2014-01-23 MED ORDER — MEMANTINE HCL-DONEPEZIL HCL ER 28-10 MG PO CP24: 1.0000 | ORAL_CAPSULE | Freq: Every day | ORAL | Status: DC

## 2014-01-23 MED ORDER — LINACLOTIDE 145 MCG PO CAPS: ORAL_CAPSULE | ORAL | Status: DC

## 2014-01-30 ENCOUNTER — Telehealth: Payer: Self-pay | Admitting: *Deleted

## 2014-01-30 NOTE — Telephone Encounter (Signed)
Received Approval letter for patient's Namzaric. Approved through insurance 10/31/13 to 01/29/2015.

## 2014-02-07 ENCOUNTER — Other Ambulatory Visit: Payer: Self-pay | Admitting: Nurse Practitioner

## 2014-02-09 ENCOUNTER — Other Ambulatory Visit: Payer: Self-pay | Admitting: Internal Medicine

## 2014-02-10 ENCOUNTER — Ambulatory Visit (INDEPENDENT_AMBULATORY_CARE_PROVIDER_SITE_OTHER): Payer: Medicare Other | Admitting: Internal Medicine

## 2014-02-10 ENCOUNTER — Encounter: Payer: Self-pay | Admitting: Internal Medicine

## 2014-02-10 VITALS — BP 122/74 | HR 64 | Temp 97.8°F | Resp 18 | Ht 60.0 in | Wt 129.0 lb

## 2014-02-10 DIAGNOSIS — F411 Generalized anxiety disorder: Secondary | ICD-10-CM

## 2014-02-10 DIAGNOSIS — L678 Other hair color and hair shaft abnormalities: Secondary | ICD-10-CM

## 2014-02-10 DIAGNOSIS — L738 Other specified follicular disorders: Secondary | ICD-10-CM | POA: Diagnosis not present

## 2014-02-10 DIAGNOSIS — L853 Xerosis cutis: Secondary | ICD-10-CM | POA: Insufficient documentation

## 2014-02-10 DIAGNOSIS — L729 Follicular cyst of the skin and subcutaneous tissue, unspecified: Principal | ICD-10-CM

## 2014-02-10 DIAGNOSIS — F419 Anxiety disorder, unspecified: Secondary | ICD-10-CM

## 2014-02-10 DIAGNOSIS — L739 Follicular disorder, unspecified: Secondary | ICD-10-CM | POA: Insufficient documentation

## 2014-02-10 MED ORDER — MEMANTINE HCL-DONEPEZIL HCL ER 28-10 MG PO CP24: 1.0000 | ORAL_CAPSULE | Freq: Every day | ORAL | Status: DC

## 2014-02-10 MED ORDER — TRIAMCINOLONE ACETONIDE 0.025 % EX OINT: 1.0000 "application " | TOPICAL_OINTMENT | Freq: Two times a day (BID) | CUTANEOUS | Status: DC

## 2014-02-10 MED ORDER — QUETIAPINE FUMARATE 25 MG PO TABS: ORAL_TABLET | ORAL | Status: DC

## 2014-02-10 MED ORDER — BACITRACIN 500 UNIT/GM EX OINT: 1.0000 "application " | TOPICAL_OINTMENT | Freq: Two times a day (BID) | CUTANEOUS | Status: DC

## 2014-02-10 MED ORDER — VORTIOXETINE HBR 10 MG PO TABS: 1.0000 | ORAL_TABLET | Freq: Every day | ORAL | Status: DC

## 2014-02-10 MED ORDER — LORAZEPAM 1 MG PO TABS: ORAL_TABLET | ORAL | Status: DC

## 2014-02-10 NOTE — Progress Notes (Signed)
Patient ID: Charlotte Kelly, female   DOB: [birth year], 80 y.o.   MRN: [id number]    Chief Complaint  Patient presents with  . Acute Visit    red spot in scalp and face and side of her ear   No Known Allergies  HPI 80 y/o female pt is seen for AV. She is here with her caregiver. She has habit of picking on her skin and her caregiver noticed red spot on her nose that she has been picking on and now it is bleeding. She was also noticed to have small lesion with swelling on the right side of face that later appeared like a pus filled point and she picked on it and then it drained pus and now has scaly area. Pt is demented and unable to provide any HPI or ROS  ROS Red spots were noticed on scalp by caregiver few days back. They changed her shampoo and now she feels it has resolved No falls reported Pt has been treated for recurrent cellulitis in past  Past Medical History  Diagnosis Date  . Arthritis   . Depression   . Cataract   . Dementia in conditions classified elsewhere with behavioral disturbance   . Anxiety   . Insomnia   . Insomnia, unspecified   . Other alteration of consciousness   . Hypertension   . Encounter for screening fecal occult blood testing 09/19/2012  . Alzheimer disease    Current Outpatient Prescriptions on File Prior to Visit  Medication Sig Dispense Refill  . aspirin 81 MG tablet Take 81 mg by mouth daily.       Marland Kitchen Linaclotide (LINZESS) 145 MCG CAPS capsule Take one capsule by mouth once daily  90 capsule  3  . lisinopril (PRINIVIL,ZESTRIL) 10 MG tablet TAKE 1 TABLET BY MOUTH EVERY DAY.  90 tablet  0  . nitrofurantoin, macrocrystal-monohydrate, (MACROBID) 100 MG capsule TAKE 1 CAPSULE BY MOUTH EVERY NIGHT AT BEDTIME  30 capsule  5  . simvastatin (ZOCOR) 10 MG tablet Take 1 tablet (10 mg total) by mouth at bedtime. For cholesterol  90 tablet  3  . trospium (SANCTURA) 20 MG tablet Take 20 mg by mouth at bedtime.        No current facility-administered medications  on file prior to visit.   Physical exam BP 122/74  Pulse 64  Temp(Src) 97.8 F (36.6 C) (Oral)  Resp 18  Ht 5' (1.524 m)  Wt 129 lb (58.514 kg)  BMI 25.19 kg/m2  SpO2 94%  Constitutional: She is well-developed, well-nourished, and in no distress.  HENT:   Head: Normocephalic and atraumatic. Seborhhea. Right side of the face in mandibular area has scaly skin with a small palpable bump noted. No signs of infection but skin area appears irritated. No bleed. Skin intact Nose: Nose normal.  has an open are in the front of the nose with some bleeding and open follicular area Mouth/Throat: Oropharynx is clear and moist.  Eyes: Conjunctivae and EOM are normal. Pupils are equal, round, and reactive to light. Cardiovascular: Normal rate, regular rhythm and normal heart sounds.   Pulmonary/Chest: Effort normal and breath sounds normal. No respiratory distress.   Assessment/plan  1. Folliculitis of nose To apply bacitracin ointment to area after cleaning it bid for a week. Avoid picking on the skin though this might be difficult for patient with her dementia and this being a chronic habit  2. Xerosis of skin encouraged to use her moisturizer. Will give triamcinolone cream 0.05%  to apply to affected area on face bid for a week  3. Anxiety Refill provided - LORazepam (ATIVAN) 1 MG tablet; 1 tablet every 8 hours as needed for anxiety  Dispense: 90 tablet; Refill: 0

## 2014-03-22 ENCOUNTER — Other Ambulatory Visit: Payer: Self-pay | Admitting: Nurse Practitioner

## 2014-03-24 ENCOUNTER — Telehealth: Payer: Self-pay | Admitting: *Deleted

## 2014-03-24 NOTE — Telephone Encounter (Signed)
Patient is picking at her skin. Picking at her face and Left arm. Are there any suggestions on how to stop her from doing this? And she picks at night because there is blood on the sheets. They try to redirect her but this isn't working. Please Advise.

## 2014-03-25 ENCOUNTER — Telehealth: Payer: Self-pay | Admitting: *Deleted

## 2014-03-25 NOTE — Telephone Encounter (Signed)
Patient caregiver, Juventino Slovak called # [phone] and stated that she needed to get patient a flu shot appointment.  LMOM to return call.

## 2014-03-27 ENCOUNTER — Ambulatory Visit (INDEPENDENT_AMBULATORY_CARE_PROVIDER_SITE_OTHER): Payer: Medicare Other

## 2014-03-27 DIAGNOSIS — Z23 Encounter for immunization: Secondary | ICD-10-CM

## 2014-03-30 NOTE — Telephone Encounter (Signed)
Patient Caregiver Notified.

## 2014-03-30 NOTE — Telephone Encounter (Signed)
May use mittens

## 2014-04-20 ENCOUNTER — Other Ambulatory Visit: Payer: Self-pay | Admitting: Internal Medicine

## 2014-05-19 ENCOUNTER — Other Ambulatory Visit: Payer: Self-pay | Admitting: Internal Medicine

## 2014-05-23 ENCOUNTER — Other Ambulatory Visit: Payer: Self-pay | Admitting: Internal Medicine

## 2014-05-25 ENCOUNTER — Encounter: Payer: Self-pay | Admitting: Internal Medicine

## 2014-05-25 ENCOUNTER — Ambulatory Visit (INDEPENDENT_AMBULATORY_CARE_PROVIDER_SITE_OTHER): Payer: Medicare Other | Admitting: Internal Medicine

## 2014-05-25 VITALS — BP 110/72 | HR 70 | Temp 97.7°F | Resp 18 | Ht 60.0 in | Wt 134.8 lb

## 2014-05-25 DIAGNOSIS — F0280 Dementia in other diseases classified elsewhere without behavioral disturbance: Secondary | ICD-10-CM

## 2014-05-25 DIAGNOSIS — F410 Panic disorder [episodic paroxysmal anxiety] without agoraphobia: Secondary | ICD-10-CM | POA: Diagnosis not present

## 2014-05-25 DIAGNOSIS — I10 Essential (primary) hypertension: Secondary | ICD-10-CM

## 2014-05-25 DIAGNOSIS — G309 Alzheimer's disease, unspecified: Secondary | ICD-10-CM

## 2014-05-25 DIAGNOSIS — F482 Pseudobulbar affect: Secondary | ICD-10-CM

## 2014-05-25 DIAGNOSIS — R634 Abnormal weight loss: Secondary | ICD-10-CM | POA: Diagnosis not present

## 2014-05-25 MED ORDER — QUETIAPINE FUMARATE 25 MG PO TABS: ORAL_TABLET | ORAL | Status: DC

## 2014-05-25 MED ORDER — DEXTROMETHORPHAN-QUINIDINE 20-10 MG PO CAPS: 1.0000 | ORAL_CAPSULE | Freq: Two times a day (BID) | ORAL | Status: DC

## 2014-05-25 NOTE — Progress Notes (Signed)
Patient ID: Charlotte Kelly, female   DOB: [birth year], 80 y.o.   MRN: [id number]   Location:  [facility]  Code Status: DNR, has living will and hcpoa on file  No Known Allergies  Chief Complaint  Patient presents with  . Medical Management of Chronic Issues    fluid intake is not good, crying spells,    HPI: Patient is a 80 y.o. female seen in the office today for medical mgt of chronic diseases.    She has not been drinking well.  Hard to get her to take medications.    She is having crying spells.  No clear external triggers to episodes.  Denies pain.  Was given a dose of ativan by her caregiver for episode.  They try to comfort her, but it does not really change.  Knows everybody loves her.    Thinks her husband is her father.   Dr. Dillard Essex has been coming to see her sometimes also.  Has recommended tylenol in afternoons.  She recommends vortioxetine be increased, but taking three pills is not going to work.    Weight has gone back up.  Review of Systems:  Review of Systems  Constitutional: Negative for fever and chills.       Weight has stabilized  HENT: Negative for congestion.   Eyes: Negative for blurred vision.  Respiratory: Negative for shortness of breath.   Cardiovascular: Negative for chest pain.  Gastrointestinal: Negative for abdominal pain and constipation.  Genitourinary: Positive for urgency and frequency. Negative for dysuria.  Musculoskeletal: Negative for falls.  Skin: Negative for rash.  Neurological: Negative for dizziness.  Psychiatric/Behavioral: Positive for depression and memory loss. The patient is nervous/anxious.        Crying spells     Past Medical History  Diagnosis Date  . Arthritis   . Depression   . Cataract   . Dementia in conditions classified elsewhere with behavioral disturbance   . Anxiety   . Insomnia   . Insomnia, unspecified   . Other alteration of consciousness   . Hypertension   .  Encounter for screening fecal occult blood testing 09/19/2012  . Alzheimer disease     Past Surgical History  Procedure Laterality Date  . Gallblader    . Small intestine surgery    . Cholecystectomy      Social History:   reports that she has quit smoking. She does not have any smokeless tobacco history on file. She reports that she does not drink alcohol or use illicit drugs.  Family History  Problem Relation Age of Onset  . Heart failure Father     Medications: Patient's Medications  New Prescriptions   No medications on file  Previous Medications   ASPIRIN 81 MG TABLET    Take 81 mg by mouth daily.    BACITRACIN 500 UNIT/GM OINTMENT    Apply 1 application topically 2 (two) times daily. On the nose as advised for a week   LINACLOTIDE (LINZESS) 145 MCG CAPS CAPSULE    Take one capsule by mouth once daily   LISINOPRIL (PRINIVIL,ZESTRIL) 10 MG TABLET    TAKE 1 TABLET BY MOUTH EVERY DAY   LORAZEPAM (ATIVAN) 1 MG TABLET    TAKE 1 TABLET BY MOUTH EVERY 8 HOURS AS NEEDED FOR ANIXETY.   MEMANTINE HCL-DONEPEZIL HCL (NAMZARIC) 28-10 MG CP24    Take 1 tablet by mouth daily. For memory   NITROFURANTOIN, MACROCRYSTAL-MONOHYDRATE, (MACROBID) 100 MG CAPSULE  TAKE 1 CAPSULE BY MOUTH EVERY NIGHT AT BEDTIME   QUETIAPINE (SEROQUEL) 25 MG TABLET    TAKE 1/2 TABLET IN THE MORNING AND TAKE 3 AT BEDTIME FOR BEHAVIORS AND SLEEP.   SIMVASTATIN (ZOCOR) 10 MG TABLET    Take 1 tablet (10 mg total) by mouth at bedtime. For cholesterol   TRIAMCINOLONE (KENALOG) 0.025 % OINTMENT    APPLY TO SKIN BENEATH RIGHT EAR TWICE DAILY FOR 1 WEEK   TROSPIUM (SANCTURA) 20 MG TABLET    Take 20 mg by mouth at bedtime.    VORTIOXETINE HBR 10 MG TABS    Take 1 tablet (10 mg total) by mouth daily. Take  10 mg by mouth daily  Modified Medications   No medications on file  Discontinued Medications   No medications on file     Physical Exam: Filed Vitals:   05/25/14 1109  BP: 110/72  Pulse: 70  Temp: 97.7 F (36.5  C)  TempSrc: Oral  Resp: 18  Height: 5' (1.524 m)  Weight: 134 lb 12.8 oz (61.145 kg)  SpO2: 95%  Physical Exam  Constitutional:  Thin white female  Cardiovascular: Normal rate, regular rhythm, normal heart sounds and intact distal pulses.   Pulmonary/Chest: Effort normal and breath sounds normal. No respiratory distress.  Abdominal: Soft. Bowel sounds are normal.  Musculoskeletal: Normal range of motion.  Walks w/o assistive device, help from caretaker  Neurological: She is alert.  Disorganized thinking, aphasic  Skin: Skin is warm and dry.  Psychiatric:  Was cheerful and smiling, then had sudden outburst of crying w/o any clear cause     Labs reviewed: Basic Metabolic Panel:  Recent Labs  07/03/13 1140 10/23/13 1116  NA 141 142  K 3.9 4.4  CL 101 103  CO2 22 24  GLUCOSE 189* 91  BUN 10 16  CREATININE 0.64 0.74  CALCIUM 9.0 9.5   Liver Function Tests:  Recent Labs  07/03/13 1140  AST 11  ALT 11  ALKPHOS 93  BILITOT 0.3  PROT 6.5   CBC:  Recent Labs  07/03/13 1140  WBC 7.1  NEUTROABS 5.1  HGB 12.8  HCT 38.6  MCV 90  PLT 226   Lab Results  Component Value Date   HGBA1C 5.9* 03/18/2013   Assessment/Plan 1. Pseudobulbar affect -seems to be cause of emotional outbursts in context of Alzheimer's disease--behavior this morning was classic for this - Dextromethorphan-Quinidine (NUEDEXTA) 20-10 MG CAPS; Take 1 capsule by mouth 2 (two) times daily.  Dispense: 60 capsule; Refill: 0  2. Alzheimer's disease -cont namzaric, seroquel, prn ativan and now start nuedexta for emotional outbursts--if not effective by next visit, would d/c -also will try to stop sanctura for her bladder -if she has increased difficulty with incontinence, would put her on myrbetriq instead to avoid anticholinergic effects (sanctura counteracts her aricept) - Dextromethorphan-Quinidine (NUEDEXTA) 20-10 MG CAPS; Take 1 capsule by mouth 2 (two) times daily.  Dispense: 60 capsule;  Refill: 0 - QUEtiapine (SEROQUEL) 25 MG tablet; TAKE 1 TABLET IN THE MORNING AND TAKE 3 AT BEDTIME FOR BEHAVIORS AND SLEEP.  Dispense: 360 tablet; Refill: 3  3. Essential hypertension -at goal with lisnopril only  4. Loss of weight -has now gained a few lbs (?seroquel), not drinking much and having some trouble taking her pills suggesting decline in her dementia  5. Panic attacks - continues on prn ativan for these--?actually pseudobulbar affect -brintellix seemed to have helped her mood considerably when this change was initiated in lace  of zoloft, but not as effective anymore--if nuedexta is ineffective, would try increasing the brintellix dose  Note that the family and caregiver were reminded that she is only to be seeing one PCP (has also been seeing Dr. Dillard Essex)  Labs/tests ordered:  No new Next appt:  4 wks with Janett Billow  Amneet Cendejas L. Jonavan Vanhorn, D.O. [facility] [address], [place] [address] Cell Phone (Mon-Fri 8am-5pm):  [phone] On Call:  [phone] & follow prompts after 5pm & weekends Office Phone:  [phone] Office Fax:  [phone]

## 2014-06-09 ENCOUNTER — Telehealth: Payer: Self-pay | Admitting: *Deleted

## 2014-06-09 NOTE — Telephone Encounter (Signed)
Discontinue the nuedexta.  We had discussed in her visit that we would try increasing her brintellix to the next available dose if she did not tolerate the nuedexta.

## 2014-06-09 NOTE — Telephone Encounter (Signed)
Adrienne, Caregiver Called and stated that the new medication prescribed at last OV is causing over sedation. The Nuedexta she has been taking since 12/14 twice daily. It has her really sleepy. This past Thursday, Friday , Saturday and Sunday they only gave it to her once daily but yesterday she started having her crying spell. Please Advise.

## 2014-06-10 NOTE — Telephone Encounter (Signed)
Please Advise on the correct next dose of Brintellix.

## 2014-06-15 ENCOUNTER — Other Ambulatory Visit: Payer: Self-pay | Admitting: Nurse Practitioner

## 2014-06-16 MED ORDER — VORTIOXETINE HBR 20 MG PO TABS: ORAL_TABLET | ORAL | Status: DC

## 2014-06-16 NOTE — Telephone Encounter (Signed)
I responded to this...unsure what happened to the response.  I may have also faxed in the prescription.

## 2014-06-16 NOTE — Telephone Encounter (Signed)
Patient caregiver Vincente Liberty Notified.

## 2014-06-18 ENCOUNTER — Telehealth: Payer: Self-pay | Admitting: *Deleted

## 2014-06-18 ENCOUNTER — Ambulatory Visit (INDEPENDENT_AMBULATORY_CARE_PROVIDER_SITE_OTHER): Payer: Medicare Other | Admitting: Nurse Practitioner

## 2014-06-18 ENCOUNTER — Encounter: Payer: Self-pay | Admitting: Nurse Practitioner

## 2014-06-18 VITALS — BP 124/82 | HR 64 | Temp 96.5°F | Resp 10 | Ht 60.0 in | Wt 134.4 lb

## 2014-06-18 DIAGNOSIS — F419 Anxiety disorder, unspecified: Secondary | ICD-10-CM | POA: Diagnosis not present

## 2014-06-18 DIAGNOSIS — F0281 Dementia in other diseases classified elsewhere with behavioral disturbance: Secondary | ICD-10-CM

## 2014-06-18 DIAGNOSIS — K5900 Constipation, unspecified: Secondary | ICD-10-CM | POA: Diagnosis not present

## 2014-06-18 DIAGNOSIS — I10 Essential (primary) hypertension: Secondary | ICD-10-CM

## 2014-06-18 DIAGNOSIS — F02818 Dementia in other diseases classified elsewhere, unspecified severity, with other behavioral disturbance: Secondary | ICD-10-CM

## 2014-06-18 DIAGNOSIS — E785 Hyperlipidemia, unspecified: Secondary | ICD-10-CM | POA: Diagnosis not present

## 2014-06-18 DIAGNOSIS — F482 Pseudobulbar affect: Secondary | ICD-10-CM

## 2014-06-18 MED ORDER — VORTIOXETINE HBR 10 MG PO TABS: ORAL_TABLET | ORAL | Status: DC

## 2014-06-18 MED ORDER — DEXTROMETHORPHAN-QUINIDINE 20-10 MG PO CAPS: ORAL_CAPSULE | ORAL | Status: DC

## 2014-06-18 NOTE — Patient Instructions (Addendum)
Increase neudexta to 1 tablet every 12 hours  May take senokot or colace daily in addition to linzess if needed for constipation   Follow up in 3 months (sooner if needed) with fasting blood work before visit.

## 2014-06-18 NOTE — Telephone Encounter (Signed)
Charlotte Kelly, Caregiver called and wanted to know if patient still should continue taking 3 Seroquel with adding the new Crying medication.  Per Janett Billow [place]---Yes she should keep it the same.  Caregiver notified and agreed.

## 2014-06-18 NOTE — Progress Notes (Signed)
Patient ID: Charlotte Kelly, female   DOB: April 09, 1930, 81 y.o.   MRN: [id number]    PCP: Lauree Chandler, NP  No Known Allergies  Chief Complaint  Patient presents with  . Medical Management of Chronic Issues    1 month follow-up, crying spells are back onset anytime.     HPI: Patient is a 81 y.o. female seen in the office today to follow up pseudobulbar affect with crying spells. Pt with pmh of dementia, depression and anxiety. Pt was starting on nuedexta 4 weeks ago by Dr Mariea Clonts. Pt became lethargic on twice daily therefore caregiver decreased her back to once daily but then it did not help with the crying.  Agitation, anger and anxiety came back this week. This is not new per caregiver. Mood comes and goes in waves that varies per week.  Did not increase brintellix to 20 mg  Reports constipation pt is taking linzess. Has attempted to increase fluid intake  Review of Systems:  Provided by Caregiver.  Review of Systems  Constitutional: Negative for activity change, appetite change, fatigue and unexpected weight change.  HENT: Negative for congestion and hearing loss.   Eyes: Negative.   Respiratory: Negative for cough and shortness of breath.   Cardiovascular: Negative for chest pain, palpitations and leg swelling.  Gastrointestinal: Positive for constipation. Negative for abdominal pain and diarrhea.  Genitourinary: Negative for dysuria and difficulty urinating.  Musculoskeletal: Negative for myalgias and arthralgias.  Skin: Negative for color change and wound.  Neurological: Negative for dizziness and weakness.  Psychiatric/Behavioral: Positive for behavioral problems, confusion and agitation.    Past Medical History  Diagnosis Date  . Arthritis   . Depression   . Cataract   . Dementia in conditions classified elsewhere with behavioral disturbance   . Anxiety   . Insomnia   . Insomnia, unspecified   . Other alteration of consciousness   . Hypertension   . Encounter for  screening fecal occult blood testing 09/19/2012  . Alzheimer disease    Past Surgical History  Procedure Laterality Date  . Gallblader    . Small intestine surgery    . Cholecystectomy     Social History:   reports that she has quit smoking. She does not have any smokeless tobacco history on file. She reports that she does not drink alcohol or use illicit drugs.  Family History  Problem Relation Age of Onset  . Heart failure Father     Medications: Patient's Medications  New Prescriptions   No medications on file  Previous Medications   ASPIRIN 81 MG TABLET    Take 81 mg by mouth daily.    BACITRACIN 500 UNIT/GM OINTMENT    Apply 1 application topically 2 (two) times daily. On the nose as advised for a week   LINZESS 145 MCG CAPS CAPSULE    TAKE 1 CAPSULE BY MOUTH EVERY DAY.   LISINOPRIL (PRINIVIL,ZESTRIL) 10 MG TABLET    TAKE 1 TABLET BY MOUTH EVERY DAY   LORAZEPAM (ATIVAN) 1 MG TABLET    TAKE 1 TABLET BY MOUTH EVERY 8 HOURS AS NEEDED FOR ANXIETY   MEMANTINE HCL-DONEPEZIL HCL (NAMZARIC) 28-10 MG CP24    Take 1 tablet by mouth daily. For memory   NITROFURANTOIN, MACROCRYSTAL-MONOHYDRATE, (MACROBID) 100 MG CAPSULE    TAKE 1 CAPSULE BY MOUTH EVERY NIGHT AT BEDTIME   NUEDEXTA 20-10 MG CAPS    1 by mouth daily in the morning   QUETIAPINE (SEROQUEL) 25 MG TABLET  TAKE 1 TABLET IN THE MORNING AND TAKE 3 AT BEDTIME FOR BEHAVIORS AND SLEEP.   SIMVASTATIN (ZOCOR) 10 MG TABLET    Take 1 tablet (10 mg total) by mouth at bedtime. For cholesterol   TRIAMCINOLONE (KENALOG) 0.025 % OINTMENT    APPLY TO SKIN BENEATH RIGHT EAR TWICE DAILY FOR 1 WEEK   VORTIOXETINE HBR (BRINTELLIX) 20 MG TABS    Take one tablet by mouth once daily for depression  Modified Medications   No medications on file  Discontinued Medications   LINACLOTIDE (LINZESS) 145 MCG CAPS CAPSULE    Take one capsule by mouth once daily     Physical Exam:  Filed Vitals:   06/18/14 1300  BP: 124/82  Pulse: 64  Temp: 96.5  F (35.8 C)  TempSrc: Axillary  Resp: 10  Height: 5' (1.524 m)  Weight: 134 lb 6.4 oz (60.963 kg)  SpO2: 99%    Physical Exam  Constitutional:  Thin white female  Cardiovascular: Normal rate, regular rhythm, normal heart sounds and intact distal pulses.   Pulmonary/Chest: Effort normal and breath sounds normal. No respiratory distress.  Abdominal: Soft. Bowel sounds are normal.  Musculoskeletal: Normal range of motion.  Walks w/o assistive device, help from caretaker  Neurological: She is alert.  Disorganized thinking, aphasic  Skin: Skin is warm and dry.  Psychiatric:  Was cheerful and smiling, babbles throughout visit. Does not follow directions    Labs reviewed: Basic Metabolic Panel:  Recent Labs  07/03/13 1140 10/23/13 1116  NA 141 142  K 3.9 4.4  CL 101 103  CO2 22 24  GLUCOSE 189* 91  BUN 10 16  CREATININE 0.64 0.74  CALCIUM 9.0 9.5   Liver Function Tests:  Recent Labs  07/03/13 1140  AST 11  ALT 11  ALKPHOS 93  BILITOT 0.3  PROT 6.5   No results for input(s): LIPASE, AMYLASE in the last 8760 hours. No results for input(s): AMMONIA in the last 8760 hours. CBC:  Recent Labs  07/03/13 1140  WBC 7.1  NEUTROABS 5.1  HGB 12.8  HCT 38.6  MCV 90  PLT 226   Lipid Panel: No results for input(s): CHOL, HDL, LDLCALC, TRIG, CHOLHDL, LDLDIRECT in the last 8760 hours. TSH: No results for input(s): TSH in the last 8760 hours. A1C: Lab Results  Component Value Date   HGBA1C 5.9* 03/18/2013     Assessment/Plan  1. Constipation, unspecified constipation type conts on lizness, may take colace or senakot daily to help with symptoms  Increase fluid intake  2. Anxiety -conts on brintellix 10 mg daily (NOT 20mg -- never increased dose), will not increase at this time.  Medication updated on MAR  3. Pseudobulbar affect -started medication twice daily without titration which caused increased sedation but helped pt with crying episodes significantly,  was instructed to DC but kept taking medication just reduced to 1 time daily which she has been on for several weeks.  -will have her take twice daily again but to take 2nd dose before bedtime.  - Dextromethorphan-Quinidine (NUEDEXTA) 20-10 MG CAPS; 1 tablet by mouth every 12 hours  Dispense: 180 capsule; Refill: 0  4. Dementia due to medical condition with behavioral disturbance Unchanged at this time. Will increase neudexta to help with crying episodes. To cont namzaric   5. Hyperlipidemia -currently on zocor - Comprehensive metabolic panel today - Lipid panel; before next visit because she is not fasting today  6. Essential hypertension -conts on lisinopril - CBC With differential/Platelet

## 2014-06-19 ENCOUNTER — Telehealth: Payer: Self-pay | Admitting: *Deleted

## 2014-06-19 LAB — CBC WITH DIFFERENTIAL
BASOS: 0 %
Basophils Absolute: 0 10*3/uL (ref 0.0–0.2)
Eos: 2 %
Eosinophils Absolute: 0.1 10*3/uL (ref 0.0–0.4)
HEMATOCRIT: 39.8 % (ref 34.0–46.6)
Hemoglobin: 12.9 g/dL (ref 11.1–15.9)
Immature Grans (Abs): 0 10*3/uL (ref 0.0–0.1)
Immature Granulocytes: 0 %
Lymphocytes Absolute: 2.7 10*3/uL (ref 0.7–3.1)
Lymphs: 36 %
MCH: 29.3 pg (ref 26.6–33.0)
MCHC: 32.4 g/dL (ref 31.5–35.7)
MCV: 90 fL (ref 79–97)
MONOCYTES: 8 %
Monocytes Absolute: 0.6 10*3/uL (ref 0.1–0.9)
Neutrophils Absolute: 3.9 10*3/uL (ref 1.4–7.0)
Neutrophils Relative %: 54 %
Platelets: 237 10*3/uL (ref 150–379)
RBC: 4.41 x10E6/uL (ref 3.77–5.28)
RDW: 14.1 % (ref 12.3–15.4)
WBC: 7.3 10*3/uL (ref 3.4–10.8)

## 2014-06-19 LAB — COMPREHENSIVE METABOLIC PANEL
ALBUMIN: 4.2 g/dL (ref 3.5–4.7)
ALK PHOS: 127 IU/L — AB (ref 39–117)
ALT: 18 IU/L (ref 0–32)
AST: 17 IU/L (ref 0–40)
Albumin/Globulin Ratio: 1.3 (ref 1.1–2.5)
BUN / CREAT RATIO: 17 (ref 11–26)
BUN: 13 mg/dL (ref 8–27)
CALCIUM: 9.3 mg/dL (ref 8.7–10.3)
CO2: 26 mmol/L (ref 18–29)
Chloride: 98 mmol/L (ref 97–108)
Creatinine, Ser: 0.75 mg/dL (ref 0.57–1.00)
GFR calc Af Amer: 86 mL/min/{1.73_m2} (ref >59)
GFR calc non Af Amer: 74 mL/min/{1.73_m2} (ref >59)
GLOBULIN, TOTAL: 3.2 g/dL (ref 1.5–4.5)
GLUCOSE: 79 mg/dL (ref 65–99)
Potassium: 4.3 mmol/L (ref 3.5–5.2)
SODIUM: 140 mmol/L (ref 134–144)
TOTAL PROTEIN: 7.4 g/dL (ref 6.0–8.5)
Total Bilirubin: 0.2 mg/dL (ref 0.0–1.2)

## 2014-06-19 NOTE — Telephone Encounter (Signed)
Patient husband, Arnell Sieving wants you to call him. He would only tell me that it deals with patient's medications. Wants you to call him # [phone]

## 2014-06-22 NOTE — Telephone Encounter (Signed)
Please call and get clarification on this, the caregiver does the majority of giving and managing medications.

## 2014-06-22 NOTE — Telephone Encounter (Signed)
I called Charlotte Kelly to get clarification on message. Charlotte Kelly could not recall the reason for his call and indicates he will back if needed.

## 2014-07-09 ENCOUNTER — Other Ambulatory Visit: Payer: Self-pay | Admitting: Internal Medicine

## 2014-07-23 ENCOUNTER — Other Ambulatory Visit: Payer: Self-pay | Admitting: Internal Medicine

## 2014-08-10 ENCOUNTER — Encounter (HOSPITAL_COMMUNITY): Payer: Self-pay

## 2014-08-10 ENCOUNTER — Emergency Department (HOSPITAL_COMMUNITY)
Admission: EM | Admit: 2014-08-10 | Discharge: 2014-08-10 | Disposition: A | Discharge status: Home / Self Care | Payer: Medicare Other | Attending: Emergency Medicine | Admitting: Emergency Medicine

## 2014-08-10 ENCOUNTER — Emergency Department (HOSPITAL_COMMUNITY): Payer: Medicare Other

## 2014-08-10 DIAGNOSIS — F419 Anxiety disorder, unspecified: Secondary | ICD-10-CM | POA: Diagnosis not present

## 2014-08-10 DIAGNOSIS — Y998 Other external cause status: Secondary | ICD-10-CM | POA: Diagnosis not present

## 2014-08-10 DIAGNOSIS — Y9389 Activity, other specified: Secondary | ICD-10-CM | POA: Diagnosis not present

## 2014-08-10 DIAGNOSIS — S199XXA Unspecified injury of neck, initial encounter: Secondary | ICD-10-CM | POA: Diagnosis not present

## 2014-08-10 DIAGNOSIS — Z79899 Other long term (current) drug therapy: Secondary | ICD-10-CM | POA: Insufficient documentation

## 2014-08-10 DIAGNOSIS — F0280 Dementia in other diseases classified elsewhere without behavioral disturbance: Secondary | ICD-10-CM | POA: Diagnosis not present

## 2014-08-10 DIAGNOSIS — Y9289 Other specified places as the place of occurrence of the external cause: Secondary | ICD-10-CM | POA: Insufficient documentation

## 2014-08-10 DIAGNOSIS — N390 Urinary tract infection, site not specified: Secondary | ICD-10-CM | POA: Insufficient documentation

## 2014-08-10 DIAGNOSIS — F329 Major depressive disorder, single episode, unspecified: Secondary | ICD-10-CM | POA: Diagnosis not present

## 2014-08-10 DIAGNOSIS — Z23 Encounter for immunization: Secondary | ICD-10-CM | POA: Insufficient documentation

## 2014-08-10 DIAGNOSIS — W19XXXA Unspecified fall, initial encounter: Secondary | ICD-10-CM

## 2014-08-10 DIAGNOSIS — S0990XA Unspecified injury of head, initial encounter: Secondary | ICD-10-CM | POA: Diagnosis present

## 2014-08-10 DIAGNOSIS — Z7982 Long term (current) use of aspirin: Secondary | ICD-10-CM | POA: Diagnosis not present

## 2014-08-10 DIAGNOSIS — G309 Alzheimer's disease, unspecified: Secondary | ICD-10-CM | POA: Insufficient documentation

## 2014-08-10 DIAGNOSIS — S0181XA Laceration without foreign body of other part of head, initial encounter: Secondary | ICD-10-CM

## 2014-08-10 DIAGNOSIS — Z87891 Personal history of nicotine dependence: Secondary | ICD-10-CM | POA: Insufficient documentation

## 2014-08-10 DIAGNOSIS — R40241 Glasgow coma scale score 13-15: Secondary | ICD-10-CM | POA: Diagnosis not present

## 2014-08-10 DIAGNOSIS — Z792 Long term (current) use of antibiotics: Secondary | ICD-10-CM | POA: Insufficient documentation

## 2014-08-10 DIAGNOSIS — W01198A Fall on same level from slipping, tripping and stumbling with subsequent striking against other object, initial encounter: Secondary | ICD-10-CM | POA: Diagnosis not present

## 2014-08-10 DIAGNOSIS — S022XXA Fracture of nasal bones, initial encounter for closed fracture: Secondary | ICD-10-CM | POA: Insufficient documentation

## 2014-08-10 DIAGNOSIS — I10 Essential (primary) hypertension: Secondary | ICD-10-CM | POA: Diagnosis not present

## 2014-08-10 DIAGNOSIS — R6889 Other general symptoms and signs: Secondary | ICD-10-CM | POA: Diagnosis not present

## 2014-08-10 DIAGNOSIS — B9689 Other specified bacterial agents as the cause of diseases classified elsewhere: Secondary | ICD-10-CM | POA: Diagnosis not present

## 2014-08-10 LAB — URINE MICROSCOPIC-ADD ON

## 2014-08-10 LAB — URINALYSIS, ROUTINE W REFLEX MICROSCOPIC
Bilirubin Urine: NEGATIVE
GLUCOSE, UA: 250 mg/dL — AB
Ketones, ur: NEGATIVE mg/dL
Nitrite: NEGATIVE
Protein, ur: 30 mg/dL — AB
Specific Gravity, Urine: >1.03 — ABNORMAL HIGH (ref 1.005–1.030)
Urobilinogen, UA: 0.2 mg/dL (ref 0.0–1.0)
pH: 6 (ref 5.0–8.0)

## 2014-08-10 LAB — I-STAT CHEM 8, ED
BUN: 18 mg/dL (ref 6–23)
CALCIUM ION: 1.12 mmol/L — AB (ref 1.13–1.30)
Chloride: 102 mmol/L (ref 96–112)
Creatinine, Ser: 0.8 mg/dL (ref 0.50–1.10)
GLUCOSE: 161 mg/dL — AB (ref 70–99)
HEMATOCRIT: 42 % (ref 36.0–46.0)
HEMOGLOBIN: 14.3 g/dL (ref 12.0–15.0)
Potassium: 4 mmol/L (ref 3.5–5.1)
Sodium: 141 mmol/L (ref 135–145)
TCO2: 24 mmol/L (ref 0–100)

## 2014-08-10 MED ORDER — TETANUS-DIPHTH-ACELL PERTUSSIS 5-2.5-18.5 LF-MCG/0.5 IM SUSP
0.5000 mL | Freq: Once | INTRAMUSCULAR | Status: AC
  Administered 2014-08-10: 0.5 mL via INTRAMUSCULAR
  Filled 2014-08-10: qty 0.5

## 2014-08-10 MED ORDER — LIDOCAINE HCL (PF) 1 % IJ SOLN
0.9000 mL | Freq: Once | INTRAMUSCULAR | Status: AC
  Administered 2014-08-10: 0.9 mL via INTRADERMAL
  Filled 2014-08-10 (×2): qty 5

## 2014-08-10 MED ORDER — CEPHALEXIN 500 MG PO CAPS: 500.0000 mg | ORAL_CAPSULE | Freq: Two times a day (BID) | ORAL | Status: DC

## 2014-08-10 MED ORDER — CEFTRIAXONE SODIUM 1 G IJ SOLR
1.0000 g | Freq: Once | INTRAMUSCULAR | Status: AC
  Administered 2014-08-10: 1 g via INTRAMUSCULAR
  Filled 2014-08-10: qty 10

## 2014-08-10 MED ORDER — DEXTROSE 5 % IV SOLN
1.0000 g | Freq: Once | INTRAVENOUS | Status: DC
  Filled 2014-08-10: qty 10

## 2014-08-10 MED ORDER — LIDOCAINE-EPINEPHRINE (PF) 2 %-1:200000 IJ SOLN
20.0000 mL | Freq: Once | INTRAMUSCULAR | Status: AC
  Administered 2014-08-10: 20 mL
  Filled 2014-08-10: qty 20

## 2014-08-10 MED ORDER — LORAZEPAM 2 MG/ML IJ SOLN
1.0000 mg | Freq: Once | INTRAMUSCULAR | Status: AC
  Administered 2014-08-10: 1 mg via INTRAMUSCULAR
  Filled 2014-08-10: qty 1

## 2014-08-10 NOTE — ED Notes (Signed)
Per EMS, patient stood up from wheelchair and was ambulating and tripped and fell. Fall was witnessed by aid. Hit head on floor, 1 inch laceration above right eye. Per aid, mental status is at baseline.

## 2014-08-10 NOTE — ED Provider Notes (Signed)
CSN: [id number]     Arrival date & time 08/10/14  1205 History   First MD Initiated Contact with Patient 08/10/14 1209     Chief Complaint  Patient presents with  . Fall     (Consider location/radiation/quality/duration/timing/severity/associated sxs/prior Treatment) HPI   Patient with hx dementia brought in after witnessed fall.  Caregiver states she lost her balance twice today, the first time she caught her and the second she couldn't get to in time.  She fell forward and on her right side.  Caregiver denies LOC.  Pt has laceration to right forehead.  Level V caveat for dementia.  Pt is unable to contribute to history.   Past Medical History  Diagnosis Date  . Arthritis   . Depression   . Cataract   . Dementia in conditions classified elsewhere with behavioral disturbance   . Anxiety   . Insomnia   . Insomnia, unspecified   . Other alteration of consciousness   . Hypertension   . Encounter for screening fecal occult blood testing 09/19/2012  . Alzheimer disease    Past Surgical History  Procedure Laterality Date  . Gallblader    . Small intestine surgery    . Cholecystectomy     Family History  Problem Relation Age of Onset  . Heart failure Father    History  Substance Use Topics  . Smoking status: Former Research scientist (life sciences)  . Smokeless tobacco: Not on file  . Alcohol Use: No   OB History    No data available     Review of Systems  Unable to perform ROS: Dementia      Allergies  Review of patient's allergies indicates no known allergies.  Home Medications   Prior to Admission medications   Medication Sig Start Date End Date Taking? Authorizing Provider  aspirin 81 MG tablet Take 81 mg by mouth daily.     Historical Provider, MD  bacitracin 500 UNIT/GM ointment Apply 1 application topically 2 (two) times daily. On the nose as advised for a week 02/10/14   Blanchie Serve, MD  Dextromethorphan-Quinidine (NUEDEXTA) 20-10 MG CAPS 1 tablet by mouth every 12 hours 06/18/14    Lauree Chandler, NP  LINZESS 145 MCG CAPS capsule TAKE 1 CAPSULE BY MOUTH EVERY DAY. 06/15/14   Tiffany L Reed, DO  lisinopril (PRINIVIL,ZESTRIL) 10 MG tablet TAKE 1 TABLET BY MOUTH EVERY DAY 03/23/14   Lauree Chandler, NP  lisinopril (PRINIVIL,ZESTRIL) 10 MG tablet TAKE 1 TABLET BY MOUTH EVERY DAY 07/09/14   Lauree Chandler, NP  LORazepam (ATIVAN) 1 MG tablet TAKE 1 TABLET BY MOUTH EVERY 8 HOURS AS NEEDED FOR ANXIETY 05/25/14   Lauree Chandler, NP  Memantine HCl-Donepezil HCl (NAMZARIC) 28-10 MG CP24 Take 1 tablet by mouth daily. For memory 02/10/14   Blanchie Serve, MD  nitrofurantoin, macrocrystal-monohydrate, (MACROBID) 100 MG capsule TAKE ONE CAPSULE BY MOUTH EVERY NIGHT AT BEDTIME 07/23/14   Lauree Chandler, NP  QUEtiapine (SEROQUEL) 25 MG tablet TAKE 1 TABLET IN THE MORNING AND TAKE 3 AT BEDTIME FOR BEHAVIORS AND SLEEP. 05/25/14   Tiffany L Reed, DO  simvastatin (ZOCOR) 10 MG tablet Take 1 tablet (10 mg total) by mouth at bedtime. For cholesterol 09/17/13   Lauree Chandler, NP  triamcinolone (KENALOG) 0.025 % ointment APPLY TO SKIN BENEATH RIGHT EAR TWICE DAILY FOR 1 WEEK 04/20/14   Blanchie Serve, MD  Vortioxetine HBr (BRINTELLIX) 10 MG TABS 1 tablet by mouth daily 06/18/14   Lauree Chandler,  NP   BP 140/60 mmHg  Temp(Src) 98.2 F (36.8 C) (Oral)  Resp 15  SpO2 97% Physical Exam  Constitutional: She appears well-developed and well-nourished. No distress.  HENT:  Head: Normocephalic. Head is with contusion and with laceration.    Eyes: Conjunctivae and EOM are normal.  Neck: Normal range of motion. Neck supple.  Cardiovascular: Normal rate and intact distal pulses.   Pulmonary/Chest: Effort normal and breath sounds normal.  Abdominal: Soft. She exhibits no distension and no mass. There is no tenderness. There is no rebound and no guarding.  Musculoskeletal: Normal range of motion. She exhibits no edema or tenderness.  No focal bony tenderness throughout exam.  Pt does have  tenderness around right facial lacerations/hematoma.  Neurological: She is alert.  Moves all extremities.  Does not follow commands or answer questions.   Skin: She is not diaphoretic.  Nursing note and vitals reviewed.   ED Course  Procedures (including critical care time) Labs Review Labs Reviewed  URINALYSIS, ROUTINE W REFLEX MICROSCOPIC - Abnormal; Notable for the following:    APPearance CLOUDY (*)    Specific Gravity, Urine >1.030 (*)    Glucose, UA 250 (*)    Hgb urine dipstick TRACE (*)    Protein, ur 30 (*)    Leukocytes, UA TRACE (*)    All other components within normal limits  URINE MICROSCOPIC-ADD ON - Abnormal; Notable for the following:    Squamous Epithelial / LPF MANY (*)    Bacteria, UA FEW (*)    All other components within normal limits  I-STAT CHEM 8, ED - Abnormal; Notable for the following:    Glucose, Bld 161 (*)    Calcium, Ion 1.12 (*)    All other components within normal limits  URINE CULTURE    Imaging Review Ct Head Wo Contrast  08/10/2014   CLINICAL DATA:  Tripped and fell, head trauma, right supraorbital laceration  EXAM: CT HEAD WITHOUT CONTRAST  CT MAXILLOFACIAL WITHOUT CONTRAST  CT CERVICAL SPINE WITHOUT CONTRAST  TECHNIQUE: Multidetector CT imaging of the head, cervical spine, and maxillofacial structures were performed using the standard protocol without intravenous contrast. Multiplanar CT image reconstructions of the cervical spine and maxillofacial structures were also generated.  COMPARISON:  Head and cervical spine CT 03/20/2013  FINDINGS: CT HEAD FINDINGS  Stable degree of diffuse cortical volume loss with proportional ventricular prominence. No midline shift. No acute hemorrhage, infarct, or mass lesion is identified. Motion artifact at the skullbase degrades imaging.  CT MAXILLOFACIAL FINDINGS  Mild streak artifact due to dental amalgam. Left temporomandibular joint degenerative change identified. Nondisplaced right nasal bone fracture, age  indeterminate. Right supraorbital laceration is present. There is mild infraorbital soft tissue swelling on the right. Globes are unremarkable. Retrobulbar fat is clean. The zygomatic arches are unremarkable. No mandibular fracture. Paranasal sinuses are clear.  CT CERVICAL SPINE FINDINGS  C1 through the cervicothoracic junction is visualized in its entirety. No fracture or dislocation. No precervical soft tissue widening. Mid cervical spine degenerative change is reidentified with moderate bilateral neural foraminal narrowing spanning C3 through C7, most prominent at C5-C6, unchanged.  IMPRESSION: Right supraorbital soft tissue laceration without underlying orbital fracture identified.  Age indeterminate nondisplaced right nasal bone fracture.  No acute intracranial abnormality.  Stable moderate midcervical degenerative change without cervical spine fracture or dislocation.   Electronically Signed   By: Conchita Paris M.D.   On: 08/10/2014 14:20   Ct Cervical Spine Wo Contrast  08/10/2014   CLINICAL  DATA:  Tripped and fell, head trauma, right supraorbital laceration  EXAM: CT HEAD WITHOUT CONTRAST  CT MAXILLOFACIAL WITHOUT CONTRAST  CT CERVICAL SPINE WITHOUT CONTRAST  TECHNIQUE: Multidetector CT imaging of the head, cervical spine, and maxillofacial structures were performed using the standard protocol without intravenous contrast. Multiplanar CT image reconstructions of the cervical spine and maxillofacial structures were also generated.  COMPARISON:  Head and cervical spine CT 03/20/2013  FINDINGS: CT HEAD FINDINGS  Stable degree of diffuse cortical volume loss with proportional ventricular prominence. No midline shift. No acute hemorrhage, infarct, or mass lesion is identified. Motion artifact at the skullbase degrades imaging.  CT MAXILLOFACIAL FINDINGS  Mild streak artifact due to dental amalgam. Left temporomandibular joint degenerative change identified. Nondisplaced right nasal bone fracture, age  indeterminate. Right supraorbital laceration is present. There is mild infraorbital soft tissue swelling on the right. Globes are unremarkable. Retrobulbar fat is clean. The zygomatic arches are unremarkable. No mandibular fracture. Paranasal sinuses are clear.  CT CERVICAL SPINE FINDINGS  C1 through the cervicothoracic junction is visualized in its entirety. No fracture or dislocation. No precervical soft tissue widening. Mid cervical spine degenerative change is reidentified with moderate bilateral neural foraminal narrowing spanning C3 through C7, most prominent at C5-C6, unchanged.  IMPRESSION: Right supraorbital soft tissue laceration without underlying orbital fracture identified.  Age indeterminate nondisplaced right nasal bone fracture.  No acute intracranial abnormality.  Stable moderate midcervical degenerative change without cervical spine fracture or dislocation.   Electronically Signed   By: Conchita Paris M.D.   On: 08/10/2014 14:20   Ct Maxillofacial Wo Cm  08/10/2014   CLINICAL DATA:  Tripped and fell, head trauma, right supraorbital laceration  EXAM: CT HEAD WITHOUT CONTRAST  CT MAXILLOFACIAL WITHOUT CONTRAST  CT CERVICAL SPINE WITHOUT CONTRAST  TECHNIQUE: Multidetector CT imaging of the head, cervical spine, and maxillofacial structures were performed using the standard protocol without intravenous contrast. Multiplanar CT image reconstructions of the cervical spine and maxillofacial structures were also generated.  COMPARISON:  Head and cervical spine CT 03/20/2013  FINDINGS: CT HEAD FINDINGS  Stable degree of diffuse cortical volume loss with proportional ventricular prominence. No midline shift. No acute hemorrhage, infarct, or mass lesion is identified. Motion artifact at the skullbase degrades imaging.  CT MAXILLOFACIAL FINDINGS  Mild streak artifact due to dental amalgam. Left temporomandibular joint degenerative change identified. Nondisplaced right nasal bone fracture, age  indeterminate. Right supraorbital laceration is present. There is mild infraorbital soft tissue swelling on the right. Globes are unremarkable. Retrobulbar fat is clean. The zygomatic arches are unremarkable. No mandibular fracture. Paranasal sinuses are clear.  CT CERVICAL SPINE FINDINGS  C1 through the cervicothoracic junction is visualized in its entirety. No fracture or dislocation. No precervical soft tissue widening. Mid cervical spine degenerative change is reidentified with moderate bilateral neural foraminal narrowing spanning C3 through C7, most prominent at C5-C6, unchanged.  IMPRESSION: Right supraorbital soft tissue laceration without underlying orbital fracture identified.  Age indeterminate nondisplaced right nasal bone fracture.  No acute intracranial abnormality.  Stable moderate midcervical degenerative change without cervical spine fracture or dislocation.   Electronically Signed   By: Conchita Paris M.D.   On: 08/10/2014 14:20     EKG Interpretation None        Date: 08/10/2014  Rate: 62  Rhythm: normal sinus rhythm  QRS Axis: left  Intervals: PR prolonged  ST/T Wave abnormalities: nonspecific T wave changes  Conduction Disutrbances:none  Narrative Interpretation:   Old EKG Reviewed: none available  LACERATION REPAIR Performed by: Clayton Bibles Authorized by: Clayton Bibles Consent: Verbal consent obtained. Risks and benefits: risks, benefits and alternatives were discussed Consent given by: patient Patient identity confirmed: provided demographic data Prepped and Draped in normal sterile fashion Wound explored  Laceration Location: right forehead  Laceration Length: 6cm  No Foreign Bodies seen or palpated  Anesthesia: local infiltration  Local anesthetic: lidocaine 2% with epinephrine  Anesthetic total: 4 ml  Irrigation method: syringe Amount of cleaning: standard  Skin closure: 4-0 vicryl  Number of sutures: 5  Technique: simple interrupted  Patient  tolerance: Patient tolerated the procedure well with no immediate complications.     MDM   Final diagnoses:  Fall, initial encounter  Forehead laceration, initial encounter  UTI (lower urinary tract infection)    Elderly patient with advanced dementia in ED following fall and two episodes of losing her balance.  Urine is very cloudy with many WBC, few bacteria - this is from an in and out cath.  Rocephin given, d/c home with keflex.  Family members prefer for patient to be d/c home given dementia.  Laceration repaired in ED.  Absorbable suture used per family and caregiver request.  Ativan given at caregiver's request as patient began getting combative as she sat longer in the ED.  CT scans without acute injury with exception of age indeterminate right nasal bone fracture.  D/C home with wound care instructions, close PCP follow up.   Discussed result, findings, treatment, and follow up  with family members.  Family members given return precautions.  Family members/caregivers verbalize understanding and agree with plan.     Clayton Bibles, PA-C 08/10/14 1655  Mariea Clonts, MD 08/15/14 [phone]

## 2014-08-10 NOTE — Discharge Instructions (Signed)
Read the information below.  Use the prescribed medication as directed.  Please discuss all new medications with your pharmacist.  You may return to the Emergency Department at any time for worsening condition or any new symptoms that concern you.    If you develop redness, swelling, pus draining from the wound, or fevers greater than 100.4, return to the ER immediately for a recheck.  The sutures are absorbable and do not need to be removed. If you would like them removed, please have your primary care provider check them in 7 days for possible removal.   Facial Laceration  A facial laceration is a cut on the face. These injuries can be painful and cause bleeding. Lacerations usually heal quickly, but they need special care to reduce scarring. DIAGNOSIS  Your health care provider will take a medical history, ask for details about how the injury occurred, and examine the wound to determine how deep the cut is. TREATMENT  Some facial lacerations may not require closure. Others may not be able to be closed because of an increased risk of infection. The risk of infection and the chance for successful closure will depend on various factors, including the amount of time since the injury occurred. The wound may be cleaned to help prevent infection. If closure is appropriate, pain medicines may be given if needed. Your health care provider will use stitches (sutures), wound glue (adhesive), or skin adhesive strips to repair the laceration. These tools bring the skin edges together to allow for faster healing and a better cosmetic outcome. If needed, you may also be given a tetanus shot. HOME CARE INSTRUCTIONS  Only take over-the-counter or prescription medicines as directed by your health care provider.  Follow your health care provider's instructions for wound care. These instructions will vary depending on the technique used for closing the wound. For Sutures:  Keep the wound clean and dry.   If you  were given a bandage (dressing), you should change it at least once a day. Also change the dressing if it becomes wet or dirty, or as directed by your health care provider.   Wash the wound with soap and water 2 times a day. Rinse the wound off with water to remove all soap. Pat the wound dry with a clean towel.   After cleaning, apply a thin layer of the antibiotic ointment recommended by your health care provider. This will help prevent infection and keep the dressing from sticking.   You may shower as usual after the first 24 hours. Do not soak the wound in water until the sutures are removed.   Get your sutures removed as directed by your health care provider. With facial lacerations, sutures should usually be taken out after 4-5 days to avoid stitch marks.   Wait a few days after your sutures are removed before applying any makeup. For Skin Adhesive Strips:  Keep the wound clean and dry.   Do not get the skin adhesive strips wet. You may bathe carefully, using caution to keep the wound dry.   If the wound gets wet, pat it dry with a clean towel.   Skin adhesive strips will fall off on their own. You may trim the strips as the wound heals. Do not remove skin adhesive strips that are still stuck to the wound. They will fall off in time.  For Wound Adhesive:  You may briefly wet your wound in the shower or bath. Do not soak or scrub the wound. Do  not swim. Avoid periods of heavy sweating until the skin adhesive has fallen off on its own. After showering or bathing, gently pat the wound dry with a clean towel.   Do not apply liquid medicine, cream medicine, ointment medicine, or makeup to your wound while the skin adhesive is in place. This may loosen the film before your wound is healed.   If a dressing is placed over the wound, be careful not to apply tape directly over the skin adhesive. This may cause the adhesive to be pulled off before the wound is healed.   Avoid  prolonged exposure to sunlight or tanning lamps while the skin adhesive is in place.  The skin adhesive will usually remain in place for 5-10 days, then naturally fall off the skin. Do not pick at the adhesive film.  After Healing: Once the wound has healed, cover the wound with sunscreen during the day for 1 full year. This can help minimize scarring. Exposure to ultraviolet light in the first year will darken the scar. It can take 1-2 years for the scar to lose its redness and to heal completely.  SEEK IMMEDIATE MEDICAL CARE IF:  You have redness, pain, or swelling around the wound.   You see ayellowish-white fluid (pus) coming from the wound.   You have chills or a fever.  MAKE SURE YOU:  Understand these instructions.  Will watch your condition.  Will get help right away if you are not doing well or get worse. Document Released: 07/06/2004 Document Revised: 03/19/2013 Document Reviewed: 01/09/2013 [facility] Patient Information 2015 [place], [place]. This information is not intended to replace advice given to you by your health care provider. Make sure you discuss any questions you have with your health care provider.  Fall Prevention and Home Safety Falls cause injuries and can affect all age groups. It is possible to use preventive measures to significantly decrease the likelihood of falls. There are many simple measures which can make your home safer and prevent falls. OUTDOORS  Repair cracks and edges of walkways and driveways.  Remove high doorway thresholds.  Trim shrubbery on the main path into your home.  Have good outside lighting.  Clear walkways of tools, rocks, debris, and clutter.  Check that handrails are not broken and are securely fastened. Both sides of steps should have handrails.  Have leaves, snow, and ice cleared regularly.  Use sand or salt on walkways during winter months.  In the garage, clean up grease or oil spills. BATHROOM  Install night  lights.  Install grab bars by the toilet and in the tub and shower.  Use non-skid mats or decals in the tub or shower.  Place a plastic non-slip stool in the shower to sit on, if needed.  Keep floors dry and clean up all water on the floor immediately.  Remove soap buildup in the tub or shower on a regular basis.  Secure bath mats with non-slip, double-sided rug tape.  Remove throw rugs and tripping hazards from the floors. BEDROOMS  Install night lights.  Make sure a bedside light is easy to reach.  Do not use oversized bedding.  Keep a telephone by your bedside.  Have a firm chair with side arms to use for getting dressed.  Remove throw rugs and tripping hazards from the floor. KITCHEN  Keep handles on pots and pans turned toward the center of the stove. Use back burners when possible.  Clean up spills quickly and allow time for drying.  Avoid walking on wet floors.  Avoid hot utensils and knives.  Position shelves so they are not too high or low.  Place commonly used objects within easy reach.  If necessary, use a sturdy step stool with a grab bar when reaching.  Keep electrical cables out of the way.  Do not use floor polish or wax that makes floors slippery. If you must use wax, use non-skid floor wax.  Remove throw rugs and tripping hazards from the floor. STAIRWAYS  Never leave objects on stairs.  Place handrails on both sides of stairways and use them. Fix any loose handrails. Make sure handrails on both sides of the stairways are as long as the stairs.  Check carpeting to make sure it is firmly attached along stairs. Make repairs to worn or loose carpet promptly.  Avoid placing throw rugs at the top or bottom of stairways, or properly secure the rug with carpet tape to prevent slippage. Get rid of throw rugs, if possible.  Have an electrician put in a light switch at the top and bottom of the stairs. OTHER FALL PREVENTION TIPS  Wear low-heel or  rubber-soled shoes that are supportive and fit well. Wear closed toe shoes.  When using a stepladder, make sure it is fully opened and both spreaders are firmly locked. Do not climb a closed stepladder.  Add color or contrast paint or tape to grab bars and handrails in your home. Place contrasting color strips on first and last steps.  Learn and use mobility aids as needed. Install an electrical emergency response system.  Turn on lights to avoid dark areas. Replace light bulbs that burn out immediately. Get light switches that glow.  Arrange furniture to create clear pathways. Keep furniture in the same place.  Firmly attach carpet with non-skid or double-sided tape.  Eliminate uneven floor surfaces.  Select a carpet pattern that does not visually hide the edge of steps.  Be aware of all pets. OTHER HOME SAFETY TIPS  Set the water temperature for 120 F (48.8 C).  Keep emergency numbers on or near the telephone.  Keep smoke detectors on every level of the home and near sleeping areas. Document Released: 05/19/2002 Document Revised: 11/28/2011 Document Reviewed: 08/18/2011 [facility] Patient Information 2015 [place], [place]. This information is not intended to replace advice given to you by your health care provider. Make sure you discuss any questions you have with your health care provider.  Urinary Tract Infection A urinary tract infection (UTI) can occur any place along the urinary tract. The tract includes the kidneys, ureters, bladder, and urethra. A type of germ called bacteria often causes a UTI. UTIs are often helped with antibiotic medicine.  HOME CARE   If given, take antibiotics as told by your doctor. Finish them even if you start to feel better.  Drink enough fluids to keep your pee (urine) clear or pale yellow.  Avoid tea, drinks with caffeine, and bubbly (carbonated) drinks.  Pee often. Avoid holding your pee in for a long time.  Pee before and after having sex  (intercourse).  Wipe from front to back after you poop (bowel movement) if you are a woman. Use each tissue only once. GET HELP RIGHT AWAY IF:   You have back pain.  You have lower belly (abdominal) pain.  You have chills.  You feel sick to your stomach (nauseous).  You throw up (vomit).  Your burning or discomfort with peeing does not go away.  You have a fever.  Your symptoms are not better in 3 days. MAKE SURE YOU:   Understand these instructions.  Will watch your condition.  Will get help right away if you are not doing well or get worse. Document Released: 11/15/2007 Document Revised: 02/21/2012 Document Reviewed: 12/28/2011 [facility] Patient Information 2015 [address] [place], [place]. This information is not intended to replace advice given to you by your health care provider. Make sure you discuss any questions you have with your health care provider.

## 2014-08-10 NOTE — ED Notes (Signed)
Emily, PA at bedside.

## 2014-08-12 ENCOUNTER — Telehealth: Payer: Self-pay

## 2014-08-12 LAB — URINE CULTURE
Colony Count: NO GROWTH
Culture: NO GROWTH

## 2014-08-12 NOTE — Telephone Encounter (Signed)
Message left on triage voicemail: Patient with diarrhea since starting antibiotic, please call and discuss. I called caregiver and gave the recommendation of floraster, align or any probiotics daily while taking antibiotic and 1 week after completion. Alternative is daily cup of yogurt.   Patient already has pending appointment for tomorrow with Sherrie Mustache, NP  Message will be sent to PCP as a FYI, please advise if any additional recommendations to be given

## 2014-08-12 NOTE — Telephone Encounter (Signed)
Thank you, recommendations look good!

## 2014-08-13 ENCOUNTER — Encounter: Payer: Self-pay | Admitting: Nurse Practitioner

## 2014-08-13 ENCOUNTER — Ambulatory Visit (INDEPENDENT_AMBULATORY_CARE_PROVIDER_SITE_OTHER): Payer: Medicare Other | Admitting: Nurse Practitioner

## 2014-08-13 VITALS — BP 138/74 | HR 70 | Temp 96.5°F | Resp 20 | Ht 60.0 in | Wt 139.6 lb

## 2014-08-13 DIAGNOSIS — IMO0002 Reserved for concepts with insufficient information to code with codable children: Secondary | ICD-10-CM

## 2014-08-13 DIAGNOSIS — R197 Diarrhea, unspecified: Secondary | ICD-10-CM | POA: Diagnosis not present

## 2014-08-13 DIAGNOSIS — W19XXXD Unspecified fall, subsequent encounter: Secondary | ICD-10-CM | POA: Diagnosis not present

## 2014-08-13 DIAGNOSIS — T148 Other injury of unspecified body region: Secondary | ICD-10-CM | POA: Diagnosis not present

## 2014-08-13 MED ORDER — NITROFURANTOIN MONOHYD MACRO 100 MG PO CAPS: 100.0000 mg | ORAL_CAPSULE | Freq: Every day | ORAL | Status: DC

## 2014-08-13 NOTE — Progress Notes (Signed)
Patient ID: Charlotte Kelly, female   DOB: 25-Mar-1930, 81 y.o.   MRN: [id number]    PCP: Lauree Chandler, NP  No Known Allergies  Chief Complaint  Patient presents with  . Follow-up    ER follow-up, patient seen in ER on 08/10/14 for fall,patient has a uti but was prescribed keflex 500mg  2 times daily (causes diarrhea) but shes taking a probiotic   . Medication Management    The hospital d/c preventative medication for UTI, please advise       HPI: Patient is a 81 y.o. female seen in the office today to follow up emergency department follow up. Pt was seen due to a fall where her caregiver could not get to her in time and she fell forward and hit her right side. Required sutures- 5 dissolvable sutures done. Cleaning laceration twice daily with soap and water. Caregiver reports pt gait was just off the day of the fall. The next day her gait was back to baseline and has not noticed worsening of her balance or gait since. conts to not drink well. Eats good per caregiver.  Noted cloudy urine while in ED, was sent for culture and sent home on keflex. Keflex has caused diarrhea and urine culture negative for UTI  Review of Systems:  Review of Systems  Constitutional: Negative for activity change, appetite change, fatigue and unexpected weight change.  HENT: Negative for congestion.   Eyes: Negative.   Respiratory: Negative for shortness of breath.   Cardiovascular: Negative for chest pain and leg swelling.  Gastrointestinal: Positive for diarrhea. Negative for abdominal pain and constipation.  Genitourinary: Negative for difficulty urinating.  Musculoskeletal: Negative for myalgias and arthralgias.  Skin: Negative for color change and wound.  Neurological: Negative for weakness.  Psychiatric/Behavioral: Positive for behavioral problems, confusion and agitation.    Past Medical History  Diagnosis Date  . Arthritis   . Depression   . Cataract   . Dementia in conditions classified  elsewhere with behavioral disturbance   . Anxiety   . Insomnia   . Insomnia, unspecified   . Other alteration of consciousness   . Hypertension   . Encounter for screening fecal occult blood testing 09/19/2012  . Alzheimer disease    Past Surgical History  Procedure Laterality Date  . Gallblader    . Small intestine surgery    . Cholecystectomy     Social History:   reports that she has quit smoking. She does not have any smokeless tobacco history on file. She reports that she does not drink alcohol or use illicit drugs.  Family History  Problem Relation Age of Onset  . Heart failure Father     Medications: Patient's Medications  New Prescriptions   No medications on file  Previous Medications   ASPIRIN 81 MG TABLET    Take 81 mg by mouth daily.    BACITRACIN 500 UNIT/GM OINTMENT    Apply 1 application topically 2 (two) times daily. On the nose as advised for a week   CEPHALEXIN (KEFLEX) 500 MG CAPSULE    Take 1 capsule (500 mg total) by mouth 2 (two) times daily.   DEXTROMETHORPHAN-QUINIDINE (NUEDEXTA) 20-10 MG CAPS    1 tablet by mouth every 12 hours   LINZESS 145 MCG CAPS CAPSULE    TAKE 1 CAPSULE BY MOUTH EVERY DAY.   LISINOPRIL (PRINIVIL,ZESTRIL) 10 MG TABLET    TAKE 1 TABLET BY MOUTH EVERY DAY   LISINOPRIL (PRINIVIL,ZESTRIL) 10 MG TABLET  TAKE 1 TABLET BY MOUTH EVERY DAY   LORAZEPAM (ATIVAN) 1 MG TABLET    TAKE 1 TABLET BY MOUTH EVERY 8 HOURS AS NEEDED FOR ANXIETY   MEMANTINE HCL-DONEPEZIL HCL (NAMZARIC) 28-10 MG CP24    Take 1 tablet by mouth daily. For memory   QUETIAPINE (SEROQUEL) 25 MG TABLET    TAKE 1 TABLET IN THE MORNING AND TAKE 3 AT BEDTIME FOR BEHAVIORS AND SLEEP.   SACCHAROMYCES BOULARDII (FLORASTOR) 250 MG CAPSULE    Take 250 mg by mouth 2 (two) times daily.   SIMVASTATIN (ZOCOR) 10 MG TABLET    Take 1 tablet (10 mg total) by mouth at bedtime. For cholesterol   TRIAMCINOLONE (KENALOG) 0.025 % OINTMENT    APPLY TO SKIN BENEATH RIGHT EAR TWICE DAILY FOR 1  WEEK   VORTIOXETINE HBR (BRINTELLIX) 10 MG TABS    1 tablet by mouth daily  Modified Medications   No medications on file  Discontinued Medications   No medications on file     Physical Exam:  Filed Vitals:   08/13/14 1101  BP: 138/74  Pulse: 70  Temp: 96.5 F (35.8 C)  TempSrc: Oral  Resp: 20  Height: 5' (1.524 m)  Weight: 139 lb 9.6 oz (63.322 kg)  SpO2: 99%    Physical Exam  Constitutional:  Thin white female  Cardiovascular: Normal rate, regular rhythm, normal heart sounds and intact distal pulses.   Pulmonary/Chest: Effort normal and breath sounds normal.  Abdominal: Soft. Bowel sounds are normal.  Musculoskeletal: Normal range of motion.  Walks w/o assistive device, help from caretaker  Neurological: She is alert.  Disorganized thinking, aphasic  Skin: Skin is warm and dry.  Laceration with sutures above right eyebrow, no signs of infection, ecchymosis to right eye from fall  Psychiatric:  Was cheerful and smiling, babbles throughout visit. Does not follow directions    Labs reviewed: Basic Metabolic Panel:  Recent Labs  10/23/13 1116 06/18/14 1404 08/10/14 1303  NA 142 140 141  K 4.4 4.3 4.0  CL 103 98 102  CO2 24 26  --   GLUCOSE 91 79 161*  BUN 16 13 18   CREATININE 0.74 0.75 0.80  CALCIUM 9.5 9.3  --    Liver Function Tests:  Recent Labs  06/18/14 1404  AST 17  ALT 18  ALKPHOS 127*  BILITOT 0.2  PROT 7.4   No results for input(s): LIPASE, AMYLASE in the last 8760 hours. No results for input(s): AMMONIA in the last 8760 hours. CBC:  Recent Labs  06/18/14 1404 08/10/14 1303  WBC 7.3  --   NEUTROABS 3.9  --   HGB 12.9 14.3  HCT 39.8 42.0  MCV 90  --   PLT 237  --    Lipid Panel: No results for input(s): CHOL, HDL, LDLCALC, TRIG, CHOLHDL, LDLDIRECT in the last 8760 hours. TSH: No results for input(s): TSH in the last 8760 hours. A1C: Lab Results  Component Value Date   HGBA1C 5.9* 03/18/2013     Assessment/Plan .1.  Fall, subsequent encounter -pt with gait disturbances at baseline due to worsening Dementia requiring assistance from caregiver, worse the day of the fall and have improved since.   2. Laceration -healing well, to continue current home care   3. Diarrhea To stop Keflex at this time, may resume MacroBID 100 mg daily for UTI prophylactic   To keep follow up for next month with Dr Mariea Clonts

## 2014-08-13 NOTE — Patient Instructions (Signed)
Cont suture care May stop keflex and start back preventive

## 2014-09-11 ENCOUNTER — Other Ambulatory Visit: Payer: Self-pay | Admitting: Nurse Practitioner

## 2014-09-11 ENCOUNTER — Telehealth: Payer: Self-pay | Admitting: *Deleted

## 2014-09-11 NOTE — Telephone Encounter (Signed)
Patient husband called and left message concerning patient's Rx.  I tried calling him back but no answer.

## 2014-09-14 MED ORDER — LINACLOTIDE 145 MCG PO CAPS: ORAL_CAPSULE | ORAL | Status: DC

## 2014-09-14 NOTE — Telephone Encounter (Signed)
Patient husband called upset because he wanted a 40 supply on patient's Linzess. I called the pharmacy and they will give him a 90 supply. Patient husband aware and agreed.

## 2014-09-16 ENCOUNTER — Other Ambulatory Visit: Payer: Medicare Other

## 2014-09-18 ENCOUNTER — Encounter: Payer: Self-pay | Admitting: Internal Medicine

## 2014-09-18 ENCOUNTER — Ambulatory Visit (INDEPENDENT_AMBULATORY_CARE_PROVIDER_SITE_OTHER): Payer: Medicare Other | Admitting: Internal Medicine

## 2014-09-18 VITALS — BP 142/82 | HR 68 | Temp 97.5°F | Wt 136.0 lb

## 2014-09-18 DIAGNOSIS — G309 Alzheimer's disease, unspecified: Secondary | ICD-10-CM

## 2014-09-18 DIAGNOSIS — I10 Essential (primary) hypertension: Secondary | ICD-10-CM | POA: Diagnosis not present

## 2014-09-18 DIAGNOSIS — F0281 Dementia in other diseases classified elsewhere with behavioral disturbance: Secondary | ICD-10-CM | POA: Diagnosis not present

## 2014-09-18 DIAGNOSIS — F482 Pseudobulbar affect: Secondary | ICD-10-CM

## 2014-09-18 DIAGNOSIS — E785 Hyperlipidemia, unspecified: Secondary | ICD-10-CM | POA: Diagnosis not present

## 2014-09-18 DIAGNOSIS — W19XXXD Unspecified fall, subsequent encounter: Secondary | ICD-10-CM | POA: Diagnosis not present

## 2014-09-18 DIAGNOSIS — F0280 Dementia in other diseases classified elsewhere without behavioral disturbance: Secondary | ICD-10-CM

## 2014-09-18 DIAGNOSIS — F02818 Dementia in other diseases classified elsewhere, unspecified severity, with other behavioral disturbance: Secondary | ICD-10-CM

## 2014-09-18 NOTE — Progress Notes (Signed)
Patient ID: Charlotte Kelly, female   DOB: December 18, 1929, 81 y.o.   MRN: [id number]   Location:  [facility]  Code Status: DNR Goals of Care: Advanced Directive information Does patient have an advance directive?: Yes, Type of Advance Directive: [place];Living will, Does patient want to make changes to advanced directive?: No - Patient declined  No Known Allergies  Chief Complaint  Patient presents with  . Medical Management of Chronic Issues    blood pressure, Alzheimers, weight loss  . balance    fell 2 weeks ago and one month ago  . Agitation    past 4 1/2 weeks, trouble taking care of her    HPI: Patient is a 81 y.o. white female seen in the office today for med mgt of chronic diseases.   Anger and combativeness has been consistent and really bad for about 5 weeks now and interfering with her care with all 4 of her caregivers.  Caregiver shows me wounds on her arms. She has had an increase in agitation and combativeness.  Won't go to restroom (sit) after saying she has to go and gets combative during cleanup.  Has fallen twice.   Caregiver says she is calm now b/c she had her ativan before coming.  Had not been using the ativan b/c it puts her off balance, but it has been needed for safety of staff and to encourage her to get bathed.   Trying to get her liquid intake up--extremely poor fluid intake.  Took 5-6 sips of water, but won't drink a full cup of water.  Giving her gatorade, cranberry, lemonade, iced tea, jello.  Appetite wonderful, but lost 3 lbs.  Very rarely inappropriate with her peers.   Cleaning place on her knee, using neosporin and covering with nonadhesive gauze pad.   No longer crying.  Seems nuedexta did help with this.  Brintellix helped the mood also.   Now angry--yelling, screaming, threatening.  A lot is focused around using the bathroom--does not want to go in there--one day did better using bedside commode.   Other episodes completely out of the blue.    Review of Systems:  ROS   Past Medical History  Diagnosis Date  . Arthritis   . Depression   . Cataract   . Dementia in conditions classified elsewhere with behavioral disturbance   . Anxiety   . Insomnia   . Insomnia, unspecified   . Other alteration of consciousness   . Hypertension   . Encounter for screening fecal occult blood testing 09/19/2012  . Alzheimer disease     Past Surgical History  Procedure Laterality Date  . Gallblader    . Small intestine surgery    . Cholecystectomy      Social History:   reports that she has quit smoking. She does not have any smokeless tobacco history on file. She reports that she does not drink alcohol or use illicit drugs.  Family History  Problem Relation Age of Onset  . Heart failure Father     Medications: Patient's Medications  New Prescriptions   No medications on file  Previous Medications   ASPIRIN 81 MG TABLET    Take 81 mg by mouth daily.    BACITRACIN 500 UNIT/GM OINTMENT    Apply 1 application topically 2 (two) times daily. On the nose as advised for a week   LINACLOTIDE (LINZESS) 145 MCG CAPS CAPSULE    Take one capsule by mouth  once daily for constipation   LISINOPRIL (PRINIVIL,ZESTRIL) 10 MG TABLET    TAKE 1 TABLET BY MOUTH EVERY DAY   LORAZEPAM (ATIVAN) 1 MG TABLET    TAKE 1 TABLET BY MOUTH EVERY 8 HOURS AS NEEDED FOR ANXIETY   MEMANTINE HCL-DONEPEZIL HCL (NAMZARIC) 28-10 MG CP24    Take 1 tablet by mouth daily. For memory   NITROFURANTOIN, MACROCRYSTAL-MONOHYDRATE, (MACROBID) 100 MG CAPSULE    Take 1 capsule (100 mg total) by mouth daily. For UTI preventative   NUEDEXTA 20-10 MG CAPS    TAKE 1 CAPSULE BY MOUTH EVERY 12 HOURS   QUETIAPINE (SEROQUEL) 25 MG TABLET    TAKE 1 TABLET IN THE MORNING AND TAKE 3 AT BEDTIME FOR BEHAVIORS AND SLEEP.   SACCHAROMYCES BOULARDII (FLORASTOR) 250 MG CAPSULE    Take 250 mg by mouth 2 (two) times daily.   SIMVASTATIN (ZOCOR) 10 MG  TABLET    Take 1 tablet (10 mg total) by mouth at bedtime. For cholesterol   SIMVASTATIN (ZOCOR) 20 MG TABLET    TAKE 1 TABLET BY MOUTH EVERY NIGHT AT BEDTIME.   TRIAMCINOLONE (KENALOG) 0.025 % OINTMENT    APPLY TO SKIN BENEATH RIGHT EAR TWICE DAILY FOR 1 WEEK   VORTIOXETINE HBR (BRINTELLIX) 10 MG TABS    1 tablet by mouth daily  Modified Medications   No medications on file  Discontinued Medications   No medications on file   Physical Exam: Filed Vitals:   09/18/14 1118  BP: 142/82  Pulse: 68  Temp: 97.5 F (36.4 C)  TempSrc: Oral  Weight: 136 lb (61.689 kg)  SpO2: 99%  Physical Exam   Labs reviewed: Basic Metabolic Panel:  Recent Labs  10/23/13 1116 06/18/14 1404 08/10/14 1303  NA 142 140 141  K 4.4 4.3 4.0  CL 103 98 102  CO2 24 26  --   GLUCOSE 91 79 161*  BUN 16 13 18   CREATININE 0.74 0.75 0.80  CALCIUM 9.5 9.3  --    Liver Function Tests:  Recent Labs  06/18/14 1404  AST 17  ALT 18  ALKPHOS 127*  BILITOT 0.2  PROT 7.4   No results for input(s): LIPASE, AMYLASE in the last 8760 hours. No results for input(s): AMMONIA in the last 8760 hours. CBC:  Recent Labs  06/18/14 1404 08/10/14 1303  WBC 7.3  --   NEUTROABS 3.9  --   HGB 12.9 14.3  HCT 39.8 42.0  MCV 90  --   PLT 237  --    Lab Results  Component Value Date   HGBA1C 5.9* 03/18/2013    Assessment/Plan 1. Hyperlipidemia - f/u labs today not done ahead: - Lipid panel  2. Essential hypertension -bp elevated above goal for younger patients, but at goal for her age and fall risk (<150/90)  3. Alzheimer's disease -late stage, she is no longer having crying, but is now primarily having anger outbursts often centering around using the bathroom -her caregiver is going to try to have her use a bedside commode rather than going all the way into the restroom which seems to agitate her  4. Dementia due to medical condition with behavioral disturbance -due to #3 -cont current regimen  without change -brintellix may have also contributed to decreased crying -have discussed many times the disadvantages of her ativan and seroquel but other solutions to her moods have been ineffective lately  5. Pseudobulbar affect -cont nuedexta--no longer has crying spells -mystery is if this is b/c of  med or simply reaching a new sage of her illness  6. Fall, subsequent encounter - due to progression of dementia -caregiver usually walks with her and they had to use a wheelchair for transport today  Labs/tests ordered:  No orders of the defined types were placed in this encounter.     Next appt:  6 wks with Janett Billow  Pearley Millington L. Lanaysia Fritchman, D.O. [facility] [address], [place] [address] Cell Phone (Mon-Fri 8am-5pm):  [phone] On Call:  [phone] & follow prompts after 5pm & weekends Office Phone:  [phone] Office Fax:  [phone]

## 2014-09-19 LAB — LIPID PANEL
Chol/HDL Ratio: 3.9 ratio units (ref 0.0–4.4)
Cholesterol, Total: 151 mg/dL (ref 100–199)
HDL: 39 mg/dL — AB (ref >39)
LDL CALC: 55 mg/dL (ref 0–99)
Triglycerides: 287 mg/dL — ABNORMAL HIGH (ref 0–149)
VLDL Cholesterol Cal: 57 mg/dL — ABNORMAL HIGH (ref 5–40)

## 2014-10-15 ENCOUNTER — Other Ambulatory Visit: Payer: Self-pay | Admitting: Nurse Practitioner

## 2014-10-22 ENCOUNTER — Other Ambulatory Visit: Payer: Self-pay | Admitting: *Deleted

## 2014-10-22 MED ORDER — NITROFURANTOIN MONOHYD MACRO 100 MG PO CAPS
100.0000 mg | ORAL_CAPSULE | Freq: Every day | ORAL | Status: DC
Start: 2014-10-22 — End: 2015-05-10

## 2014-10-22 NOTE — Telephone Encounter (Signed)
Patient caregiver requested refill. Faxed to pharmacy.  

## 2014-10-23 ENCOUNTER — Telehealth: Payer: Self-pay | Admitting: *Deleted

## 2014-10-23 NOTE — Telephone Encounter (Signed)
Adriene called and stated that patient's Nitrofurantoin needs Prior Authorization. Called and spoke with Detra #:[phone]. Patient ID [id number]. Approved Case #: [id number] good through 06/12/2015. Caregiver Notified.

## 2014-10-27 ENCOUNTER — Ambulatory Visit: Payer: Medicare Other | Admitting: Nurse Practitioner

## 2014-11-02 ENCOUNTER — Other Ambulatory Visit: Payer: Self-pay | Admitting: Nurse Practitioner

## 2014-11-03 ENCOUNTER — Ambulatory Visit (INDEPENDENT_AMBULATORY_CARE_PROVIDER_SITE_OTHER): Payer: Medicare Other | Admitting: Nurse Practitioner

## 2014-11-03 ENCOUNTER — Encounter: Payer: Self-pay | Admitting: Nurse Practitioner

## 2014-11-03 VITALS — BP 110/70 | HR 73 | Temp 97.2°F | Resp 20 | Ht 60.0 in | Wt 136.6 lb

## 2014-11-03 DIAGNOSIS — K5900 Constipation, unspecified: Secondary | ICD-10-CM

## 2014-11-03 DIAGNOSIS — N390 Urinary tract infection, site not specified: Secondary | ICD-10-CM

## 2014-11-03 DIAGNOSIS — F482 Pseudobulbar affect: Secondary | ICD-10-CM

## 2014-11-03 DIAGNOSIS — I10 Essential (primary) hypertension: Secondary | ICD-10-CM | POA: Diagnosis not present

## 2014-11-03 DIAGNOSIS — F0391 Unspecified dementia with behavioral disturbance: Secondary | ICD-10-CM

## 2014-11-03 DIAGNOSIS — F03918 Unspecified dementia, unspecified severity, with other behavioral disturbance: Secondary | ICD-10-CM | POA: Insufficient documentation

## 2014-11-03 NOTE — Patient Instructions (Signed)
Keep medications the same, follow up in 3 months

## 2014-11-03 NOTE — Progress Notes (Signed)
Patient ID: Charlotte Kelly, female   DOB: March 01, 1930, 81 y.o.   MRN: [id number]    PCP: Lauree Chandler, NP  No Known Allergies  Chief Complaint  Patient presents with  . Medical Management of Chronic Issues    6 week follo-up, labs in chart  . Immunizations    would like Prevnar 13     HPI: Patient is a 81 y.o. female seen in the office today to follow up chronic conditions. Balance remains an issue. Not walking as much, using WC. No other falls noted. Mood has been the same, comes and goes in waves. No worsening of combativeness and aggression   linzess still helping with constipation   Taking medications crush in applesauce, no trouble with this.  Eating well.    Review of Systems: provided by caregiver Review of Systems  Constitutional: Negative for activity change, appetite change, fatigue and unexpected weight change.  HENT: Negative for congestion and hearing loss.   Eyes: Negative.   Respiratory: Negative for cough and shortness of breath.   Cardiovascular: Negative for chest pain and leg swelling.  Gastrointestinal: Negative for abdominal pain, diarrhea and constipation.  Genitourinary: Negative for dysuria, frequency and difficulty urinating.  Musculoskeletal: Negative for myalgias and arthralgias.  Skin: Negative for color change and wound.  Neurological: Negative for dizziness and weakness.       Balance issues  Psychiatric/Behavioral: Positive for behavioral problems, confusion and agitation.    Past Medical History  Diagnosis Date  . Arthritis   . Depression   . Cataract   . Dementia in conditions classified elsewhere with behavioral disturbance   . Anxiety   . Insomnia   . Insomnia, unspecified   . Other alteration of consciousness   . Hypertension   . Encounter for screening fecal occult blood testing 09/19/2012  . Alzheimer disease    Past Surgical History  Procedure Laterality Date  . Gallblader    . Small intestine surgery    .  Cholecystectomy     Social History:   reports that she has quit smoking. She has never used smokeless tobacco. She reports that she does not drink alcohol or use illicit drugs.  Family History  Problem Relation Age of Onset  . Heart failure Father     Medications: Patient's Medications  New Prescriptions   No medications on file  Previous Medications   ASPIRIN 81 MG TABLET    Take 81 mg by mouth daily.    BACITRACIN 500 UNIT/GM OINTMENT    Apply 1 application topically 2 (two) times daily. On the nose as advised for a week   BRINTELLIX 10 MG TABS    TAKE 1 TABLET BY MOUTH DAILY   LINACLOTIDE (LINZESS) 145 MCG CAPS CAPSULE    Take one capsule by mouth once daily for constipation   LISINOPRIL (PRINIVIL,ZESTRIL) 10 MG TABLET    TAKE 1 TABLET BY MOUTH EVERY DAY   LORAZEPAM (ATIVAN) 1 MG TABLET    TAKE 1 TABLET BY MOUTH EVERY 8 HOURS AS NEEDED FOR ANXIETY.   MEMANTINE HCL-DONEPEZIL HCL (NAMZARIC) 28-10 MG CP24    Take 1 tablet by mouth daily. For memory   NITROFURANTOIN, MACROCRYSTAL-MONOHYDRATE, (MACROBID) 100 MG CAPSULE    Take 1 capsule (100 mg total) by mouth daily. For UTI preventative   NUEDEXTA 20-10 MG CAPS    TAKE 1 CAPSULE BY MOUTH EVERY 12 HOURS   QUETIAPINE (SEROQUEL) 25 MG TABLET    TAKE 1 TABLET IN THE MORNING AND TAKE  3 AT BEDTIME FOR BEHAVIORS AND SLEEP.   SACCHAROMYCES BOULARDII (FLORASTOR) 250 MG CAPSULE    Take 250 mg by mouth 2 (two) times daily.   SIMVASTATIN (ZOCOR) 20 MG TABLET    TAKE 1 TABLET BY MOUTH EVERY NIGHT AT BEDTIME.   TRIAMCINOLONE (KENALOG) 0.025 % OINTMENT    APPLY TO SKIN BENEATH RIGHT EAR TWICE DAILY FOR 1 WEEK  Modified Medications   No medications on file  Discontinued Medications   No medications on file     Physical Exam:  Filed Vitals:   11/03/14 1053  BP: 110/70  Pulse: 73  Temp: 97.2 F (36.2 C)  TempSrc: Oral  Resp: 20  Height: 5' (1.524 m)  Weight: 136 lb 9.6 oz (61.961 kg)  SpO2: 93%    Physical Exam  Constitutional:  Thin  white female  Cardiovascular: Normal rate, regular rhythm, normal heart sounds and intact distal pulses.   Pulmonary/Chest: Effort normal and breath sounds normal.  Abdominal: Soft. Bowel sounds are normal.  Musculoskeletal: Normal range of motion.  Gait unsteady, using WC and help from caretaker  Neurological: She is alert.  Disorganized thinking, aphasic  Skin: Skin is warm and dry.  Psychiatric:  cheerful and smiling, babbles throughout visit. Does not follow directions    Labs reviewed: Basic Metabolic Panel:  Recent Labs  06/18/14 1404 08/10/14 1303  NA 140 141  K 4.3 4.0  CL 98 102  CO2 26  --   GLUCOSE 79 161*  BUN 13 18  CREATININE 0.75 0.80  CALCIUM 9.3  --    Liver Function Tests:  Recent Labs  06/18/14 1404  AST 17  ALT 18  ALKPHOS 127*  BILITOT 0.2  PROT 7.4   No results for input(s): LIPASE, AMYLASE in the last 8760 hours. No results for input(s): AMMONIA in the last 8760 hours. CBC:  Recent Labs  06/18/14 1404 08/10/14 1303  WBC 7.3  --   NEUTROABS 3.9  --   HGB 12.9 14.3  HCT 39.8 42.0  MCV 90  --   PLT 237  --    Lipid Panel:  Recent Labs  09/18/14 0947  CHOL 151  HDL 39*  LDLCALC 55  TRIG 287*  CHOLHDL 3.9   TSH: No results for input(s): TSH in the last 8760 hours. A1C: Lab Results  Component Value Date   HGBA1C 5.9* 03/18/2013     Assessment/Plan 1. Essential hypertension -stable on lisinpril, labs up to date   2. Constipation, unspecified constipation type -well controlled on linzess   3. Dementia with behavioral disturbance -advanced dementia, noted with ongoing decline in mobility over the past few months, pt with a lot of support by caregiver. conts on seroquel for behaviors and namzaric   4. Recurrent UTI No recent UTI, conts on macrobid daily for preventative   5. Pseudobulbar affect conts on neudexta daily  Follow up in 3 months with Dr Mariea Clonts Will discuss vaccine with her daughter and let use  know

## 2015-01-07 ENCOUNTER — Other Ambulatory Visit: Payer: Self-pay | Admitting: Nurse Practitioner

## 2015-01-18 ENCOUNTER — Other Ambulatory Visit: Payer: Self-pay | Admitting: Nurse Practitioner

## 2015-02-04 ENCOUNTER — Other Ambulatory Visit: Payer: Self-pay | Admitting: Nurse Practitioner

## 2015-02-05 ENCOUNTER — Ambulatory Visit: Payer: Medicare Other | Admitting: Internal Medicine

## 2015-02-18 ENCOUNTER — Other Ambulatory Visit: Payer: Self-pay | Admitting: Nurse Practitioner

## 2015-03-09 ENCOUNTER — Other Ambulatory Visit: Payer: Self-pay | Admitting: Nurse Practitioner

## 2015-03-29 IMAGING — CT CT KNEE*R* W/O CM
2 of 3 series · 7 of 33 positions shown, 8 images · non-contrast
Comparison: None.

CLINICAL DATA: Fall.  Knee pain.

CT OF THE RIGHT KNEE WITHOUT CONTRAST
TECHNIQUE: Multidetector CT imaging was performed according to the
standard protocol. Multiplanar CT image reconstructions were also
generated.

[Series 3: soft tissue · axial · 0.18mm/px · z∈[-229,-91]mm · 4 of 81 slices shown, 5 images]
[im 13/81  soft-tissue]
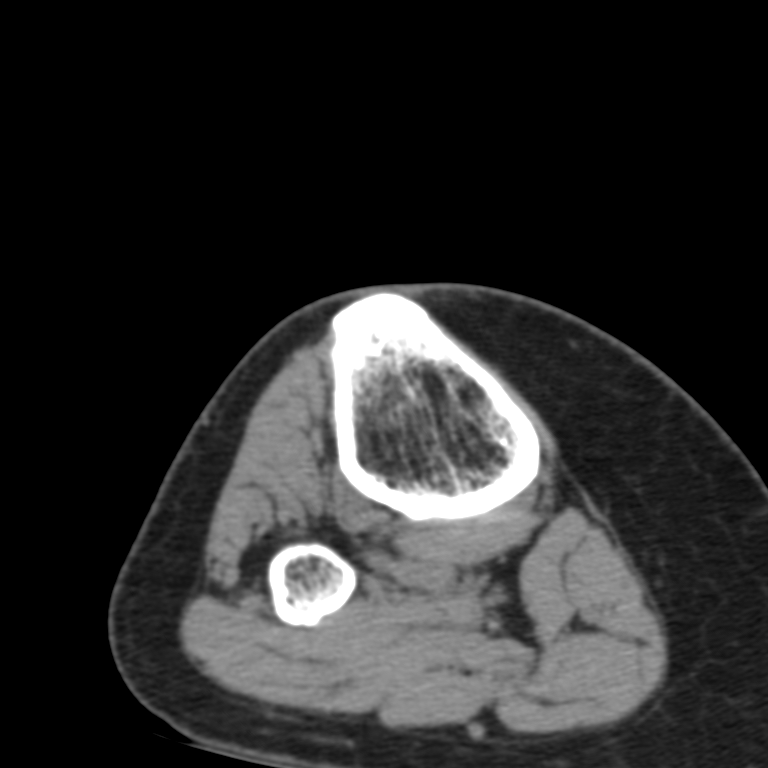
[im 13/81  bone]
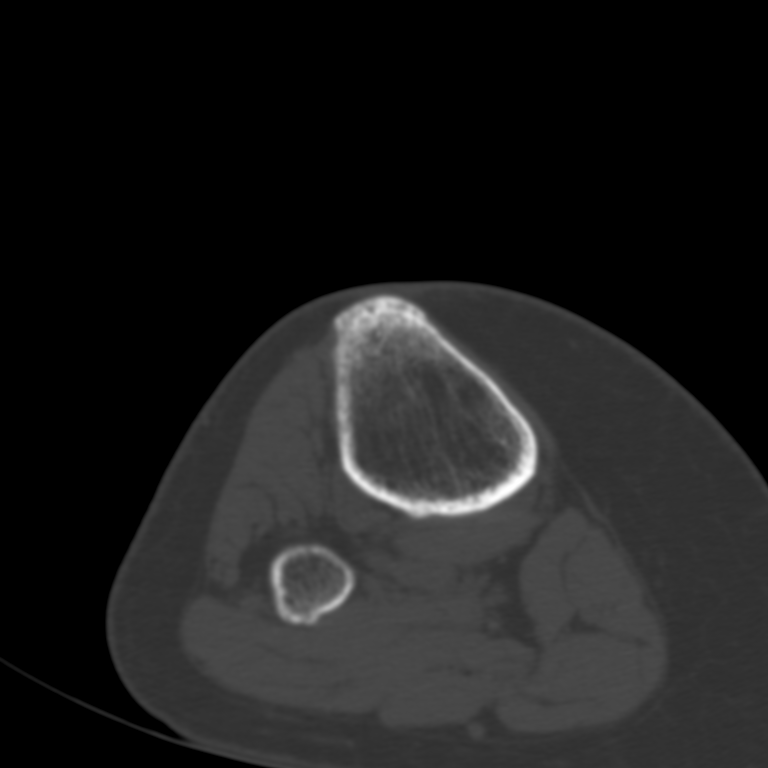
[im 31/81  bone]
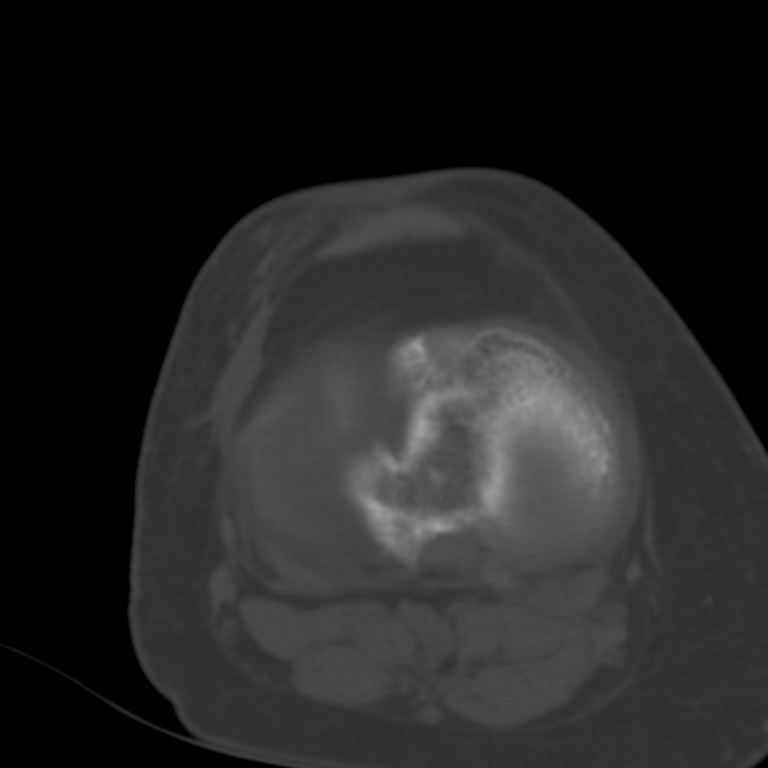
[im 50/81  bone]
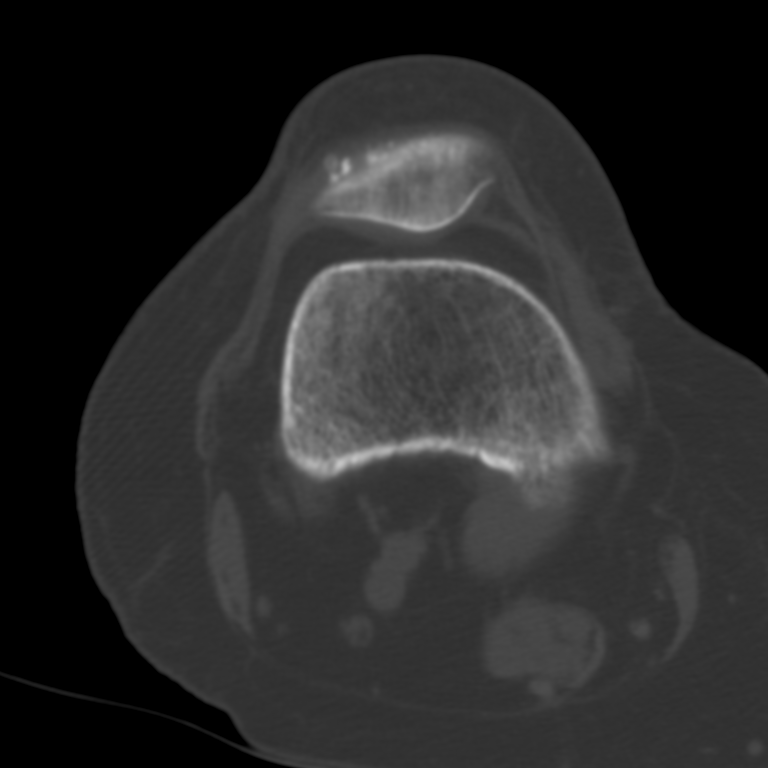
[im 68/81  bone]
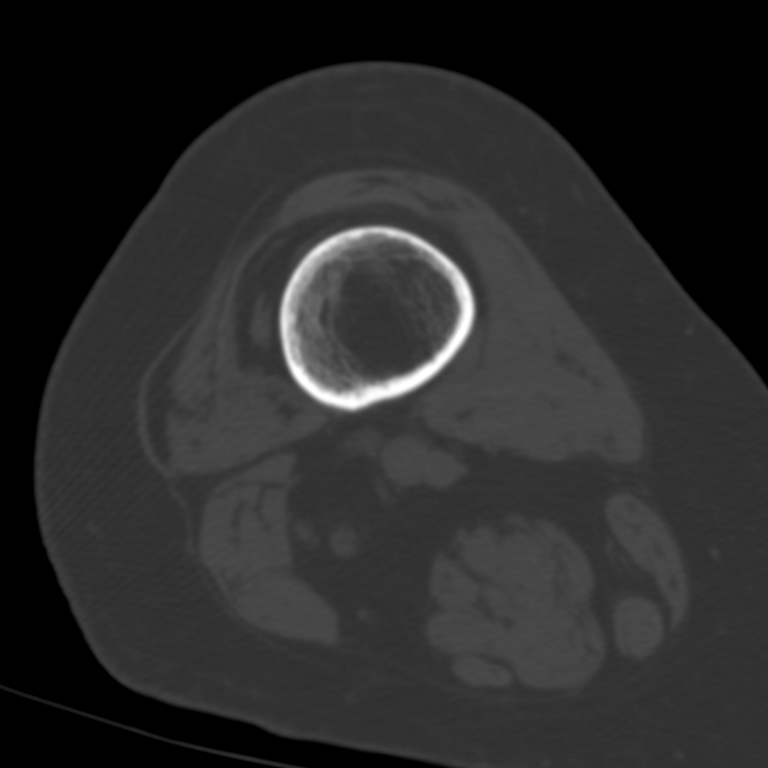

[st coronal · coronal · 0.26mm/px · 3 of 50 slices shown]
[im 10/50  bone]
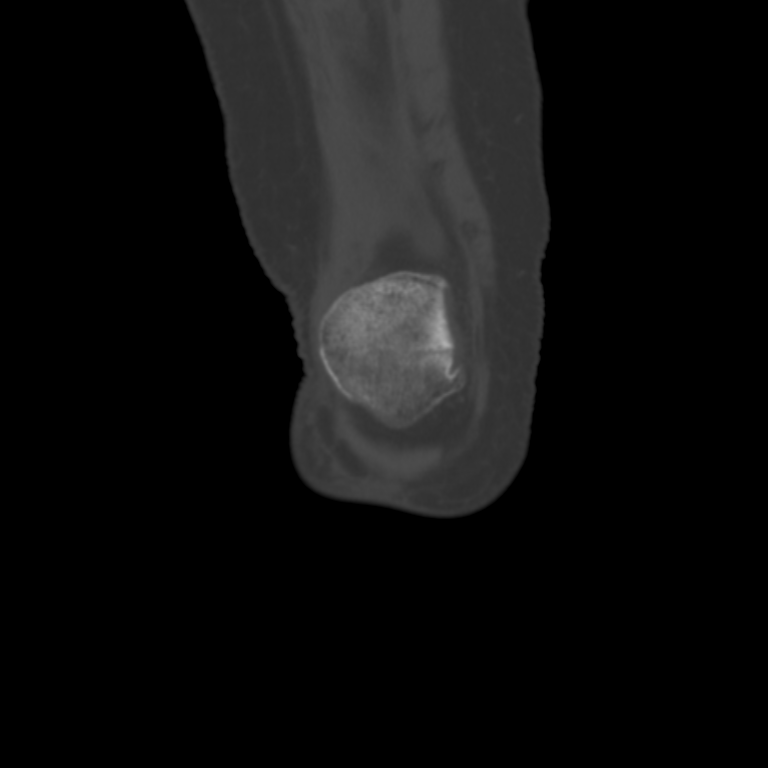
[im 20/50  bone]
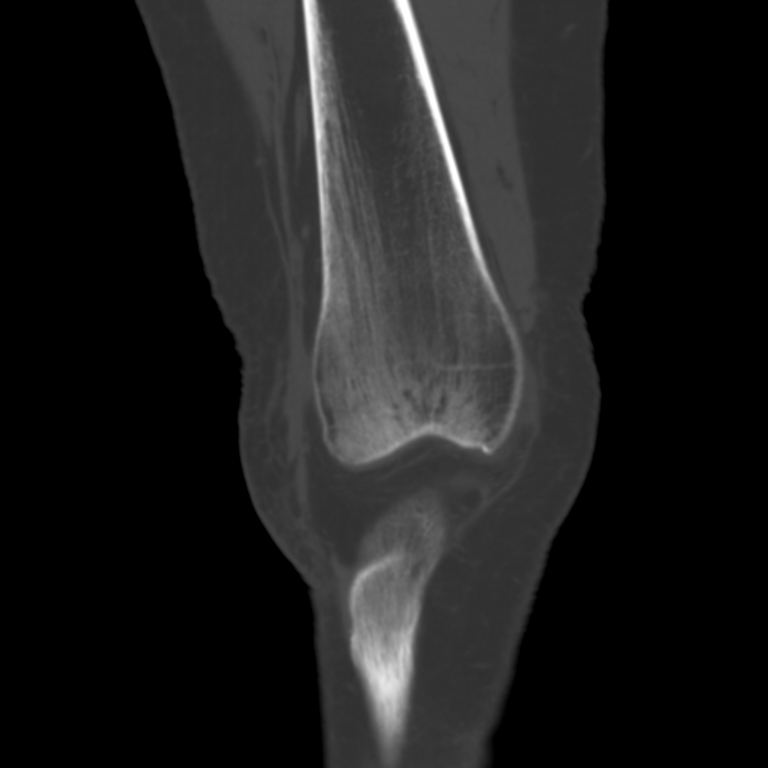
[im 30/50  bone]
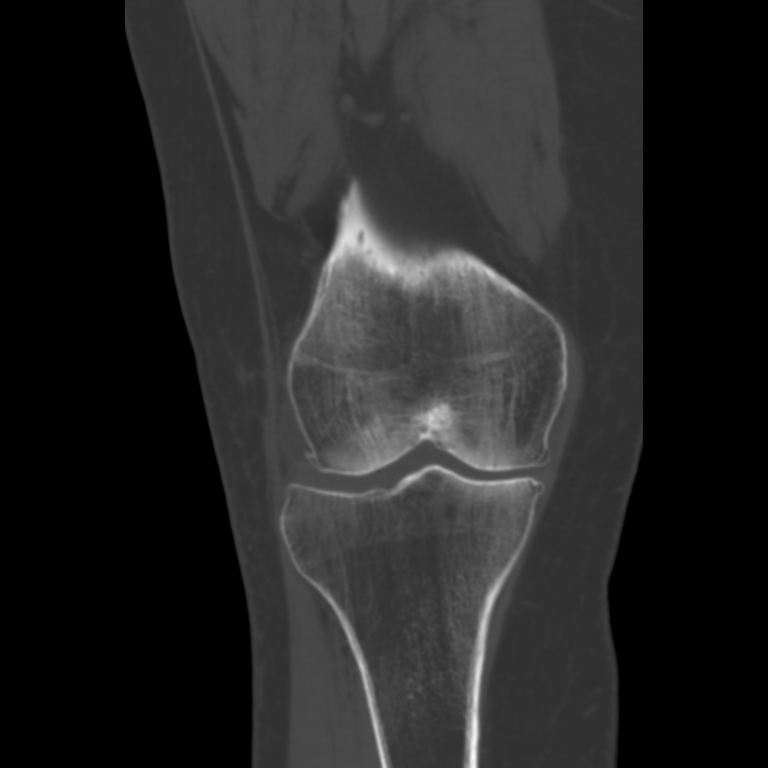

[7 of 33 positions shown; findings below may reference images not displayed]

FINDINGS: There is no knee effusion.  Patella is intact. Osteopenia
present.  Fibular head and neck appears within normal limits.
Moderate osteoarthritis of the medial and lateral compartment.
Moderate osteoarthritis of patellofemoral compartment with central
osteophyte in the medial patellar facet.  Popliteal fossa appears
within normal limits.  Mild soft tissue stranding is present in the
subcutaneous fat along the lateral patellar retinaculum.  Medial
subcutaneous fat appears within normal limits.
IMPRESSION: 1.  Moderate tricompartmental osteoarthritis without acute osseous
abnormality.
2.  Anterolateral subcutaneous fat stranding likely represents
contusion.
3.  Osteopenia.

## 2015-04-01 ENCOUNTER — Encounter: Payer: Self-pay | Admitting: Nurse Practitioner

## 2015-04-01 ENCOUNTER — Ambulatory Visit (INDEPENDENT_AMBULATORY_CARE_PROVIDER_SITE_OTHER): Payer: Medicare Other | Admitting: Nurse Practitioner

## 2015-04-01 VITALS — BP 122/76 | HR 66 | Temp 97.4°F | Ht 60.0 in | Wt 136.0 lb

## 2015-04-01 DIAGNOSIS — K5900 Constipation, unspecified: Secondary | ICD-10-CM

## 2015-04-01 DIAGNOSIS — F419 Anxiety disorder, unspecified: Secondary | ICD-10-CM

## 2015-04-01 DIAGNOSIS — F03918 Unspecified dementia, unspecified severity, with other behavioral disturbance: Secondary | ICD-10-CM

## 2015-04-01 DIAGNOSIS — F482 Pseudobulbar affect: Secondary | ICD-10-CM

## 2015-04-01 DIAGNOSIS — I10 Essential (primary) hypertension: Secondary | ICD-10-CM

## 2015-04-01 DIAGNOSIS — Z23 Encounter for immunization: Secondary | ICD-10-CM | POA: Diagnosis not present

## 2015-04-01 DIAGNOSIS — E785 Hyperlipidemia, unspecified: Secondary | ICD-10-CM | POA: Diagnosis not present

## 2015-04-01 DIAGNOSIS — F0391 Unspecified dementia with behavioral disturbance: Secondary | ICD-10-CM

## 2015-04-01 MED ORDER — LISINOPRIL 10 MG PO TABS: 10.0000 mg | ORAL_TABLET | Freq: Every day | ORAL | Status: DC

## 2015-04-01 MED ORDER — SIMVASTATIN 20 MG PO TABS: ORAL_TABLET | ORAL | Status: DC

## 2015-04-01 NOTE — Progress Notes (Signed)
Patient ID: Charlotte Kelly, female   DOB: 04-21-1930, 81 y.o.   MRN: [id number]    PCP: Hollace Kinnier, DO  Advanced Directive information Does patient have an advance directive?: Yes, Type of Advance Directive: [place];Living will, Does patient want to make changes to advanced directive?: No - Patient declined  No Known Allergies  Chief Complaint  Patient presents with  . Medical Management of Chronic Issues    3 month follow-up, not fasting    . Immunizations    Flu Vaccine      HPI: Patient is a 81 y.o. female seen in the office today to follow up chronic conditions. Pt with a pmh of advanced dementia, depression and anxiety, constipation, PBA, HTN, recurrent UTI.  No worsening behaviors. Sleeping good at night.  Still walking, no recent falls. Uses WC at times due to balance issues after with ativan. Eating well. No worsening of anxiety.  linzess still working well for bowels   Review of Systems: provided by caregiver Review of Systems  Constitutional: Negative for activity change, appetite change, fatigue and unexpected weight change.  HENT: Negative for congestion and hearing loss.   Eyes: Negative.   Respiratory: Negative for cough and shortness of breath.   Cardiovascular: Negative for chest pain and leg swelling.  Gastrointestinal: Negative for abdominal pain, diarrhea and constipation.  Genitourinary: Negative for dysuria, frequency and difficulty urinating.  Musculoskeletal: Negative for myalgias and arthralgias.  Skin: Negative for color change and wound.  Neurological: Negative for dizziness and weakness.       Balance issues  Psychiatric/Behavioral: Positive for behavioral problems, confusion and agitation.    Past Medical History  Diagnosis Date  . Arthritis   . Depression   . Cataract   . Dementia in conditions classified elsewhere with behavioral disturbance   . Anxiety   . Insomnia   . Insomnia, unspecified   . Other alteration of  consciousness   . Hypertension   . Encounter for screening fecal occult blood testing 09/19/2012  . Alzheimer disease    Past Surgical History  Procedure Laterality Date  . Gallblader    . Small intestine surgery    . Cholecystectomy     Social History:   reports that she has quit smoking. She has never used smokeless tobacco. She reports that she does not drink alcohol or use illicit drugs.  Family History  Problem Relation Age of Onset  . Heart failure Father     Medications: Patient's Medications  New Prescriptions   No medications on file  Previous Medications   ASPIRIN 81 MG TABLET    Take 81 mg by mouth daily.    BACITRACIN 500 UNIT/GM OINTMENT    Apply 1 application topically 2 (two) times daily. On the nose as advised for a week   LINACLOTIDE (LINZESS) 145 MCG CAPS CAPSULE    Take one capsule by mouth once daily for constipation   LISINOPRIL (PRINIVIL,ZESTRIL) 10 MG TABLET    TAKE 1 TABLET BY MOUTH EVERY DAY.   LORAZEPAM (ATIVAN) 1 MG TABLET    TAKE 1 TABLET BY MOUTH EVERY 8 HOURS AS NEEDED FOR ANXIETY   MEMANTINE HCL-DONEPEZIL HCL (NAMZARIC) 28-10 MG CP24    Take 1 tablet by mouth daily. For memory   NITROFURANTOIN, MACROCRYSTAL-MONOHYDRATE, (MACROBID) 100 MG CAPSULE    Take 1 capsule (100 mg total) by mouth daily. For UTI preventative   NUEDEXTA 20-10 MG CAPS    TAKE 1 CAPSULE BY MOUTH EVERY 12 HOURS  QUETIAPINE (SEROQUEL) 25 MG TABLET    TAKE 1 TABLET IN THE MORNING AND TAKE 3 AT BEDTIME FOR BEHAVIORS AND SLEEP.   SACCHAROMYCES BOULARDII (FLORASTOR) 250 MG CAPSULE    Take 250 mg by mouth 2 (two) times daily.   SIMVASTATIN (ZOCOR) 20 MG TABLET    TAKE 1 TABLET BY MOUTH EVERY NIGHT AT BEDTIME.   TRIAMCINOLONE (KENALOG) 0.025 % OINTMENT    APPLY TO SKIN BENEATH RIGHT EAR TWICE DAILY FOR 1 WEEK   TRINTELLIX 10 MG TABS    TAKE 1 TABLET BY MOUTH DAILY  Modified Medications   No medications on file  Discontinued Medications   LISINOPRIL (PRINIVIL,ZESTRIL) 10 MG TABLET     TAKE 1 TABLET BY MOUTH EVERY DAY     Physical Exam:  Filed Vitals:   04/01/15 1421  BP: 122/76  Pulse: 66  Temp: 97.4 F (36.3 C)  TempSrc: Oral  Height: 5' (1.524 m)  Weight: 136 lb (61.689 kg)  SpO2: 93%   Body mass index is 26.56 kg/(m^2).  Physical Exam  Constitutional: No distress.  Thin white female  Cardiovascular: Normal rate, regular rhythm and normal heart sounds.   Pulmonary/Chest: Effort normal and breath sounds normal.  Abdominal: Soft. Bowel sounds are normal.  Musculoskeletal: Normal range of motion.  Gait unsteady, walking today  Neurological: She is alert.  Disorganized thinking, aphasic  Skin: Skin is warm and dry. She is not diaphoretic.  Psychiatric:  cheerful and smiling, babbles throughout visit    Labs reviewed: Basic Metabolic Panel:  Recent Labs  06/18/14 1404 08/10/14 1303  NA 140 141  K 4.3 4.0  CL 98 102  CO2 26  --   GLUCOSE 79 161*  BUN 13 18  CREATININE 0.75 0.80  CALCIUM 9.3  --    Liver Function Tests:  Recent Labs  06/18/14 1404  AST 17  ALT 18  ALKPHOS 127*  BILITOT 0.2  PROT 7.4  ALBUMIN 4.2   No results for input(s): LIPASE, AMYLASE in the last 8760 hours. No results for input(s): AMMONIA in the last 8760 hours. CBC:  Recent Labs  06/18/14 1404 08/10/14 1303  WBC 7.3  --   NEUTROABS 3.9  --   HGB 12.9 14.3  HCT 39.8 42.0  MCV 90  --   PLT 237  --    Lipid Panel:  Recent Labs  09/18/14 0947  CHOL 151  HDL 39*  LDLCALC 55  TRIG 287*  CHOLHDL 3.9   TSH: No results for input(s): TSH in the last 8760 hours. A1C: Lab Results  Component Value Date   HGBA1C 5.9* 03/18/2013     Assessment/Plan 1. Essential hypertension Blood pressure controlled on lisinopril, to cont current dose at this time - lisinopril (PRINIVIL,ZESTRIL) 10 MG tablet; Take 1 tablet (10 mg total) by mouth daily.  Dispense: 90 tablet; Refill: 2 - CBC with Differential  2. Constipation, unspecified constipation  type -well controlled on linzess, to continue at current dose  3. Dementia with behavioral disturbance Advanced dementia, conts on namzaric daily, has care provided by caregiver and husband around the clock.   4. Pseudobulbar affect -without changes, conts on neudexta 20-10 daily   5. Anxiety Stable and unchanged,  cont on ativan as needed  6. Hyperlipidemia - simvastatin (ZOCOR) 20 MG tablet; TAKE 1 TABLET BY MOUTH EVERY NIGHT AT BEDTIME.  Dispense: 90 tablet; Refill: 2 - Comprehensive metabolic panel  Follow up in 6 months with Dr Mariea Clonts, sooner if needed  [facility]  [address], [facility] [phone] 8 am - 5 pm) [phone] (after hours)

## 2015-04-01 NOTE — Patient Instructions (Signed)
Follow up in 6 months with Dr Mariea Clonts

## 2015-04-02 LAB — COMPREHENSIVE METABOLIC PANEL
ALK PHOS: 109 IU/L (ref 39–117)
ALT: 24 IU/L (ref 0–32)
AST: 19 IU/L (ref 0–40)
Albumin/Globulin Ratio: 1.3 (ref 1.1–2.5)
Albumin: 4 g/dL (ref 3.5–4.7)
BUN/Creatinine Ratio: 16 (ref 11–26)
BUN: 14 mg/dL (ref 8–27)
Bilirubin Total: 0.3 mg/dL (ref 0.0–1.2)
CHLORIDE: 99 mmol/L (ref 97–106)
CO2: 24 mmol/L (ref 18–29)
Calcium: 9.2 mg/dL (ref 8.7–10.3)
Creatinine, Ser: 0.85 mg/dL (ref 0.57–1.00)
GFR calc Af Amer: 73 mL/min/{1.73_m2} (ref >59)
GFR calc non Af Amer: 64 mL/min/{1.73_m2} (ref >59)
GLUCOSE: 104 mg/dL — AB (ref 65–99)
Globulin, Total: 3 g/dL (ref 1.5–4.5)
Potassium: 4.2 mmol/L (ref 3.5–5.2)
Sodium: 142 mmol/L (ref 136–144)
TOTAL PROTEIN: 7 g/dL (ref 6.0–8.5)

## 2015-04-02 LAB — CBC WITH DIFFERENTIAL/PLATELET
BASOS ABS: 0 10*3/uL (ref 0.0–0.2)
Basos: 0 %
EOS (ABSOLUTE): 0.1 10*3/uL (ref 0.0–0.4)
EOS: 1 %
Hematocrit: 39.8 % (ref 34.0–46.6)
Hemoglobin: 13.2 g/dL (ref 11.1–15.9)
IMMATURE GRANS (ABS): 0 10*3/uL (ref 0.0–0.1)
IMMATURE GRANULOCYTES: 0 %
LYMPHS: 26 %
Lymphocytes Absolute: 1.7 10*3/uL (ref 0.7–3.1)
MCH: 30.8 pg (ref 26.6–33.0)
MCHC: 33.2 g/dL (ref 31.5–35.7)
MCV: 93 fL (ref 79–97)
MONOCYTES: 6 %
Monocytes Absolute: 0.4 10*3/uL (ref 0.1–0.9)
Neutrophils Absolute: 4.5 10*3/uL (ref 1.4–7.0)
Neutrophils: 67 %
Platelets: 225 10*3/uL (ref 150–379)
RBC: 4.28 x10E6/uL (ref 3.77–5.28)
RDW: 13.9 % (ref 12.3–15.4)
WBC: 6.7 10*3/uL (ref 3.4–10.8)

## 2015-04-26 ENCOUNTER — Other Ambulatory Visit: Payer: Self-pay | Admitting: Internal Medicine

## 2015-05-10 ENCOUNTER — Other Ambulatory Visit: Payer: Self-pay | Admitting: Nurse Practitioner

## 2015-05-11 NOTE — Telephone Encounter (Signed)
Please advise if ok to refill for this is not a maintenance medication

## 2015-06-08 ENCOUNTER — Other Ambulatory Visit: Payer: Self-pay | Admitting: Internal Medicine

## 2015-06-16 ENCOUNTER — Other Ambulatory Visit: Payer: Self-pay | Admitting: Internal Medicine

## 2015-06-16 ENCOUNTER — Other Ambulatory Visit: Payer: Self-pay | Admitting: Nurse Practitioner

## 2015-07-14 ENCOUNTER — Other Ambulatory Visit: Payer: Self-pay | Admitting: Internal Medicine

## 2015-08-11 ENCOUNTER — Other Ambulatory Visit: Payer: Self-pay | Admitting: *Deleted

## 2015-08-11 MED ORDER — LORAZEPAM 1 MG PO TABS: 1.0000 mg | ORAL_TABLET | Freq: Three times a day (TID) | ORAL | Status: DC | PRN

## 2015-08-11 NOTE — Telephone Encounter (Signed)
[facility] [address] 

## 2015-08-14 ENCOUNTER — Other Ambulatory Visit: Payer: Self-pay | Admitting: Internal Medicine

## 2015-08-30 ENCOUNTER — Telehealth: Payer: Self-pay | Admitting: *Deleted

## 2015-08-30 NOTE — Telephone Encounter (Signed)
Patient caregiver, Vincente Liberty called and stated that patient has head congestion, stuffy and sneezing and runny nose. No fever. Wants to know what she can give her since no available appointments. Please Advise.

## 2015-08-31 NOTE — Telephone Encounter (Signed)
Patient caregiver notified and agreed. 

## 2015-08-31 NOTE — Telephone Encounter (Signed)
She may also use guaifenasin (mucinex) 600mg  po q12hrs.  I also recommend warm humidity with sitting in the shower or a humidifier.  She can take robitussin (plain) 2 tsp q 6 hrs as needed for cough if this progresses to that.  If she develops fever or increased chest congestion, she definitely needs to be seen (perhaps by NP during half day?).

## 2015-09-06 ENCOUNTER — Other Ambulatory Visit: Payer: Self-pay | Admitting: Internal Medicine

## 2015-09-06 NOTE — Telephone Encounter (Signed)
Please advise if ok to fill medication for UTI prevention

## 2015-09-12 ENCOUNTER — Other Ambulatory Visit: Payer: Self-pay | Admitting: Internal Medicine

## 2015-09-14 ENCOUNTER — Telehealth: Payer: Self-pay

## 2015-09-14 NOTE — Telephone Encounter (Signed)
Quantity exemption forms were received and started. Forms were placed in Dr. Cyndi Lennert folder for review and completion.

## 2015-09-14 NOTE — Telephone Encounter (Signed)
Prior authorization was received for a quantity limit exemption for Nitrofurantn 100 mg capsules. Prior authorization was initiated by calling [phone]. Patient ID# [id number].   Quantity limit exemption forms will be faxed to office. Forms should be completed and faxed back to number listed on form.

## 2015-09-16 ENCOUNTER — Other Ambulatory Visit: Payer: Self-pay | Admitting: Internal Medicine

## 2015-09-16 DIAGNOSIS — F482 Pseudobulbar affect: Secondary | ICD-10-CM

## 2015-09-23 NOTE — Telephone Encounter (Signed)
Nitrofurantoin request has been denied. Appeals forms were completed and faxed to [phone].  Awaiting determination.

## 2015-09-27 ENCOUNTER — Telehealth: Payer: Self-pay

## 2015-09-27 NOTE — Telephone Encounter (Signed)
Letter received indicating Nitrofurantoin was approved 06/29/15-06/11/16.   Approval letter faxed to [facility]. Letter placed on ledge for signature then to scanning

## 2015-10-03 ENCOUNTER — Other Ambulatory Visit: Payer: Self-pay | Admitting: Internal Medicine

## 2015-10-07 ENCOUNTER — Ambulatory Visit (INDEPENDENT_AMBULATORY_CARE_PROVIDER_SITE_OTHER): Payer: Medicare Other | Admitting: Internal Medicine

## 2015-10-07 ENCOUNTER — Encounter: Payer: Self-pay | Admitting: Internal Medicine

## 2015-10-07 VITALS — BP 130/70 | HR 85 | Temp 97.6°F | Ht 60.0 in | Wt 132.0 lb

## 2015-10-07 DIAGNOSIS — Z23 Encounter for immunization: Secondary | ICD-10-CM | POA: Diagnosis not present

## 2015-10-07 DIAGNOSIS — G301 Alzheimer's disease with late onset: Secondary | ICD-10-CM

## 2015-10-07 DIAGNOSIS — F02818 Dementia in other diseases classified elsewhere, unspecified severity, with other behavioral disturbance: Secondary | ICD-10-CM

## 2015-10-07 DIAGNOSIS — I10 Essential (primary) hypertension: Secondary | ICD-10-CM

## 2015-10-07 DIAGNOSIS — F482 Pseudobulbar affect: Secondary | ICD-10-CM | POA: Diagnosis not present

## 2015-10-07 DIAGNOSIS — K5900 Constipation, unspecified: Secondary | ICD-10-CM

## 2015-10-07 DIAGNOSIS — F329 Major depressive disorder, single episode, unspecified: Secondary | ICD-10-CM | POA: Diagnosis not present

## 2015-10-07 DIAGNOSIS — F0281 Dementia in other diseases classified elsewhere with behavioral disturbance: Secondary | ICD-10-CM

## 2015-10-07 DIAGNOSIS — F32A Depression, unspecified: Secondary | ICD-10-CM

## 2015-10-07 NOTE — Progress Notes (Signed)
Patient ID: Charlotte Kelly, female   DOB: Aug 19, 1929, 82 y.o.   MRN: [id number]   Location:  [facility] clinic Provider:  Debbera Wolken L. Mariea Kelly, D.O., C.M.D.  Code Status: DNR Goals of Care:  Advanced Directives 10/07/2015  Does patient have an advance directive? Yes  Type of Advance Directive Living will  Does patient want to make changes to advanced directive? -  Copy of advanced directive(s) in chart? Yes   Chief Complaint  Patient presents with  . Medical Management of Chronic Issues    6 mth follow-up, caretaker Ephriam Knuckles, husband    HPI: Patient is a 82 y.o. female seen today for medical management of chronic diseases.    No new things with [name].  She has her spells of crying, but it's not as bad as it used to be.  She's combative here and there.  Eating and drinking well (requires more encouragement).  Sleeping ok at night.  No falls.  Engaging with peers in building.  Still friendly and fun and eats in dining room each day at lunch.  Attends activities.  Bowels doing well with linzess.  BP is good.  Weight 6 mos ago was 136 and now 132 lbs.  Still picks some.  Some days talks in sentences.  Some days will not talk at all.  Follows some commands.  Pt's dementia far progressed--is still ambulatory, but difficulty at times following commands so bone density would be technically challenging if not impossible. Past Medical History  Diagnosis Date  . Arthritis   . Depression   . Cataract   . Dementia in conditions classified elsewhere with behavioral disturbance   . Anxiety   . Insomnia   . Insomnia, unspecified   . Other alteration of consciousness   . Hypertension   . Encounter for screening fecal occult blood testing 09/19/2012  . Alzheimer disease     Past Surgical History  Procedure Laterality Date  . Gallblader    . Small intestine surgery    . Cholecystectomy      No Known Allergies    Medication List       This list is accurate as of: 10/07/15 11:30 AM.  Always use  your most recent med list.               aspirin 81 MG tablet  Take 81 mg by mouth daily.     bacitracin 500 UNIT/GM ointment  Apply 1 application topically 2 (two) times daily. On the nose as advised for a week     linaclotide 145 MCG Caps capsule  Commonly known as:  LINZESS  Take one capsule by mouth once daily for constipation     lisinopril 10 MG tablet  Commonly known as:  PRINIVIL,ZESTRIL  Take 1 tablet (10 mg total) by mouth daily.     LORazepam 1 MG tablet  Commonly known as:  ATIVAN  Take 1 tablet (1 mg total) by mouth every 8 (eight) hours as needed. for anxiety     NAMZARIC 28-10 MG Cp24  Generic drug:  Memantine HCl-Donepezil HCl  TAKE 1 CAPSULE BY MOUTH EVERY DAY FOR MEMORY     nitrofurantoin (macrocrystal-monohydrate) 100 MG capsule  Commonly known as:  MACROBID  TAKE ONE CAPSULE BY MOUTH EVERY DAY FOR UTI PREVENTION     NUEDEXTA 20-10 MG Caps  Generic drug:  Dextromethorphan-Quinidine  TAKE 1 CAPSULE BY MOUTH EVERY 12 HOURS     QUEtiapine 25 MG tablet  Commonly known as:  SEROQUEL  TAKE 1 TABLET BY MOUTH IN THE MORNING AND TAKE 3 TABLETS BY MOUTH AT BEDTIME FOR BEHAVIORS AND SLEEP     saccharomyces boulardii 250 MG capsule  Commonly known as:  FLORASTOR  Take 250 mg by mouth 2 (two) times daily.     simvastatin 20 MG tablet  Commonly known as:  ZOCOR  TAKE 1 TABLET BY MOUTH EVERY NIGHT AT BEDTIME.     triamcinolone 0.025 % ointment  Commonly known as:  KENALOG  APPLY TO SKIN BENEATH RIGHT EAR TWICE DAILY FOR 1 WEEK     TRINTELLIX 10 MG Tabs  Generic drug:  Vortioxetine HBr  TAKE 1 TABLET BY MOUTH DAILY        Review of Systems:  See what I could obtain from her caregiver and her husband above in HPI Review of Systems  Unable to perform ROS: dementia    Health Maintenance  Topic Date Due  . PNA vac Low Risk Adult (1 of 2 - PCV13) 10/22/1996  . DEXA SCAN  06/12/2018 (Originally 10/22/1996)  . ZOSTAVAX  06/12/2018 (Originally 10/23/1991)    . INFLUENZA VACCINE  01/11/2016  . TETANUS/TDAP  08/09/2024    Physical Exam: Filed Vitals:   10/07/15 1058  BP: 130/70  Pulse: 85  Temp: 97.6 F (36.4 C)  TempSrc: Oral  Height: 5' (1.524 m)  Weight: 132 lb (59.875 kg)  SpO2: 94%   Body mass index is 25.78 kg/(m^2). Physical Exam  Constitutional: She appears well-nourished. No distress.  HENT:  Head: Normocephalic and atraumatic.  Cardiovascular: Normal rate, regular rhythm, normal heart sounds and intact distal pulses.   Pulmonary/Chest: Effort normal and breath sounds normal. No respiratory distress.  Abdominal: Bowel sounds are normal.  Musculoskeletal:  Still ambulatory but with assistance of caregiver   Neurological:  Was drowsy this am (caregiver reports she had to wake her up early); much less verbal and interactive than usual today  Psychiatric:  When aroused, she was smiling and joking    Labs reviewed: Basic Metabolic Panel:  Recent Labs  04/01/15 1449  NA 142  K 4.2  CL 99  CO2 24  GLUCOSE 104*  BUN 14  CREATININE 0.85  CALCIUM 9.2   Liver Function Tests:  Recent Labs  04/01/15 1449  AST 19  ALT 24  ALKPHOS 109  BILITOT 0.3  PROT 7.0  ALBUMIN 4.0   No results for input(s): LIPASE, AMYLASE in the last 8760 hours. No results for input(s): AMMONIA in the last 8760 hours. CBC:  Recent Labs  04/01/15 1449  WBC 6.7  NEUTROABS 4.5  HCT 39.8  MCV 93  PLT 225   Lipid Panel: No results for input(s): CHOL, HDL, LDLCALC, TRIG, CHOLHDL, LDLDIRECT in the last 8760 hours. Lab Results  Component Value Date   HGBA1C 5.9* 03/18/2013   Assessment/Plan 1. Pseudobulbar affect -still has some episodes of inappropriate crying, but nuedexta has helped decrease frequency by caregiver report  2. Essential hypertension -bp well controlled on current regimen w/o any known dizziness or side effects so cont same and monitor  3. Constipation, unspecified constipation type -doing fine recently with  her florastor and linzess  4. Late onset Alzheimer's disease with behavioral disturbance -continues on namzaric, seroquel for behaviors and depression -? Much benefit to the namzaric at this point, but did not discuss at appt--will bring up next time -she is stable in her mood/spirits for [place] -afraid to "rock the boat"  5. Depression -continues on low dose trintellix with perceived  benefit by caregiver, also gets ativan for anxiety and agitation at times  6. Need for vaccination with 13-polyvalent pneumococcal conjugate vaccine -prevnar given  Next appt:  6 mos for annual exam  Tasia Liz L. Aseel Truxillo, D.O. [facility] [address], [place] [address] Cell Phone (Mon-Fri 8am-5pm):  [phone] On Call:  [phone] & follow prompts after 5pm & weekends Office Phone:  [phone] Office Fax:  [phone]

## 2015-10-10 ENCOUNTER — Other Ambulatory Visit: Payer: Self-pay | Admitting: Nurse Practitioner

## 2015-10-10 ENCOUNTER — Other Ambulatory Visit: Payer: Self-pay | Admitting: Internal Medicine

## 2015-10-31 ENCOUNTER — Other Ambulatory Visit: Payer: Self-pay | Admitting: Internal Medicine

## 2015-11-24 ENCOUNTER — Ambulatory Visit (INDEPENDENT_AMBULATORY_CARE_PROVIDER_SITE_OTHER): Payer: Medicare Other | Admitting: Podiatry

## 2015-11-24 ENCOUNTER — Encounter: Payer: Self-pay | Admitting: Podiatry

## 2015-11-24 DIAGNOSIS — B351 Tinea unguium: Secondary | ICD-10-CM

## 2015-11-24 DIAGNOSIS — M79676 Pain in unspecified toe(s): Secondary | ICD-10-CM

## 2015-11-24 NOTE — Addendum Note (Signed)
Addended by: Ezzard Flax, Matt Delpizzo L on: 11/24/2015 02:32 PM   Modules accepted: Orders, Medications

## 2015-11-24 NOTE — Progress Notes (Signed)
   Subjective:    Patient ID: Charlotte Kelly, female    DOB: 1930-05-03, 82 y.o.   MRN: [id number]  HPI this patient presents to the office with long thick painful nails. Her nails have grown long and she has a history of Alzheimer's and dementia and the nails have not been treated for a long period of time. The nails are painful wearing her shoes. She presents the office for preventative foot care services  Review of Systems  All other systems reviewed and are negative.      Objective:   Physical Exam GENERAL APPEARANCE: Alert, conversant. Appropriately groomed. No acute distress.  VASCULAR: Pedal pulses are  palpable at  DP bilateral.  PT are non-palpable..  Capillary refill time is immediate to all digits,  Cold feet noted.  NEUROLOGIC: sensation is normal to 5.07 monofilament at 5/5 sites bilateral.  Light touch is intact bilateral, Muscle strength normal.  MUSCULOSKELETAL: acceptable muscle strength, tone and stability bilateral.  Intrinsic muscluature intact bilateral.  Rectus appearance of foot and digits noted bilateral.   DERMATOLOGIC: skin color, texture, and turgor are within normal limits.  No preulcerative lesions or ulcers  are seen, no interdigital maceration noted.  No open lesions present.  . No drainage noted.  NAILS  Thick disfigured discolored nails both feet.        Assessment & Plan:  Onychomycosis B/L    IE  Debridement of nails.  RTC prn   Gardiner Barefoot DPM

## 2015-11-30 ENCOUNTER — Other Ambulatory Visit: Payer: Self-pay | Admitting: Internal Medicine

## 2015-12-01 ENCOUNTER — Emergency Department (HOSPITAL_COMMUNITY)
Admission: EM | Admit: 2015-12-01 | Discharge: 2015-12-01 | Disposition: A | Discharge status: Home / Self Care | Payer: Medicare Other | Attending: Emergency Medicine | Admitting: Emergency Medicine

## 2015-12-01 ENCOUNTER — Other Ambulatory Visit: Payer: Self-pay

## 2015-12-01 ENCOUNTER — Encounter (HOSPITAL_COMMUNITY): Payer: Self-pay | Admitting: Emergency Medicine

## 2015-12-01 DIAGNOSIS — I10 Essential (primary) hypertension: Secondary | ICD-10-CM | POA: Insufficient documentation

## 2015-12-01 DIAGNOSIS — M1990 Unspecified osteoarthritis, unspecified site: Secondary | ICD-10-CM | POA: Insufficient documentation

## 2015-12-01 DIAGNOSIS — Z7982 Long term (current) use of aspirin: Secondary | ICD-10-CM | POA: Diagnosis not present

## 2015-12-01 DIAGNOSIS — F329 Major depressive disorder, single episode, unspecified: Secondary | ICD-10-CM | POA: Insufficient documentation

## 2015-12-01 DIAGNOSIS — R55 Syncope and collapse: Secondary | ICD-10-CM | POA: Diagnosis not present

## 2015-12-01 DIAGNOSIS — F0391 Unspecified dementia with behavioral disturbance: Secondary | ICD-10-CM

## 2015-12-01 DIAGNOSIS — R531 Weakness: Secondary | ICD-10-CM | POA: Diagnosis not present

## 2015-12-01 DIAGNOSIS — R404 Transient alteration of awareness: Secondary | ICD-10-CM | POA: Diagnosis not present

## 2015-12-01 DIAGNOSIS — Z79899 Other long term (current) drug therapy: Secondary | ICD-10-CM | POA: Diagnosis not present

## 2015-12-01 DIAGNOSIS — Z87891 Personal history of nicotine dependence: Secondary | ICD-10-CM | POA: Insufficient documentation

## 2015-12-01 LAB — BASIC METABOLIC PANEL
Anion gap: 10 (ref 5–15)
BUN: 17 mg/dL (ref 6–20)
CALCIUM: 9 mg/dL (ref 8.9–10.3)
CHLORIDE: 105 mmol/L (ref 101–111)
CO2: 26 mmol/L (ref 22–32)
CREATININE: 0.8 mg/dL (ref 0.44–1.00)
GFR calc Af Amer: >60 mL/min (ref >60)
GFR calc non Af Amer: >60 mL/min (ref >60)
Glucose, Bld: 143 mg/dL — ABNORMAL HIGH (ref 65–99)
Potassium: 4.3 mmol/L (ref 3.5–5.1)
SODIUM: 141 mmol/L (ref 135–145)

## 2015-12-01 LAB — CBC
HCT: 39.8 % (ref 36.0–46.0)
Hemoglobin: 12.9 g/dL (ref 12.0–15.0)
MCH: 30.4 pg (ref 26.0–34.0)
MCHC: 32.4 g/dL (ref 30.0–36.0)
MCV: 93.9 fL (ref 78.0–100.0)
PLATELETS: 186 10*3/uL (ref 150–400)
RBC: 4.24 MIL/uL (ref 3.87–5.11)
RDW: 13.7 % (ref 11.5–15.5)
WBC: 8 10*3/uL (ref 4.0–10.5)

## 2015-12-01 LAB — CBG MONITORING, ED: GLUCOSE-CAPILLARY: 153 mg/dL — AB (ref 65–99)

## 2015-12-01 NOTE — ED Notes (Signed)
She remains in no distress and is d/c with her daughter; husband; and home health caregiver.

## 2015-12-01 NOTE — ED Provider Notes (Signed)
Medical screening examination/treatment/procedure(s) were conducted as a shared visit with non-physician practitioner(s) and myself.  I personally evaluated the patient during the encounter.  82 yo F w/ weak legs, possibly near syncope. Family not concerned and would not want invasive testing treatments if indicated so basic screening done. CN's, Neuro sensation and motor all intact on my exam. No obvious traumatic injuries. ecg and labs ok. Plan for dc to care of family.    EKG Interpretation   Date/Time:  Wednesday December 01 2015 16:29:45 EDT Ventricular Rate:  59 PR Interval:    QRS Duration: 137 QT Interval:  513 QTC Calculation: 509 R Axis:   -85 Text Interpretation:  Sinus rhythm RBBB and LAFB Confirmed by Rogene Houston   MD, SCOTT ([id number]) on 12/01/2015 4:37:16 PM        Merrily Pew, MD 12/02/15 [phone]

## 2015-12-01 NOTE — ED Notes (Signed)
Redo on PT ekg

## 2015-12-01 NOTE — ED Notes (Signed)
Pt cannot use restroom at this time, aware urine specimen is needed.  

## 2015-12-01 NOTE — ED Provider Notes (Signed)
CSN: [id number]     Arrival date & time 12/01/15  1349 History   First MD Initiated Contact with Patient 12/01/15 1648     Chief Complaint  Patient presents with  . Near Syncope     (Consider location/radiation/quality/duration/timing/severity/associated sxs/prior Treatment) HPI   Charlotte Kelly is a 82 y.o F with a pmhx of Alzheimer's dementia, HTN, who presents to the ED to be evaluated after an episode of near syncope. Level V caveat, dementia. Per pts caregiver the pt was ambulating back to her room when her legs gave out on her. Pt became slightly pale and her eyes appeared fixed, this lasted briefly. The caregiver caught the pt and lowered her to the ground where she immediately returned to her baseline and was playing with the grass. No injury occurred. No slurred speech, facial droop. No chest pain or difficulty breathing. No reported focal weakness. No syncope.  Past Medical History  Diagnosis Date  . Arthritis   . Depression   . Cataract   . Dementia in conditions classified elsewhere with behavioral disturbance   . Anxiety   . Insomnia   . Insomnia, unspecified   . Other alteration of consciousness   . Hypertension   . Encounter for screening fecal occult blood testing 09/19/2012  . Alzheimer disease    Past Surgical History  Procedure Laterality Date  . Gallblader    . Small intestine surgery    . Cholecystectomy     Family History  Problem Relation Age of Onset  . Heart failure Father    Social History  Substance Use Topics  . Smoking status: Former Research scientist (life sciences)  . Smokeless tobacco: Never Used  . Alcohol Use: No   OB History    No data available     Review of Systems  Unable to perform ROS: Dementia      Allergies  Review of patient's allergies indicates no known allergies.  Home Medications   Prior to Admission medications   Medication Sig Start Date End Date Taking? Authorizing Provider  aspirin 81 MG tablet Take 81 mg by mouth daily.    Yes  Historical Provider, MD  bacitracin 500 UNIT/GM ointment Apply 1 application topically 2 (two) times daily. On the nose as advised for a week Patient taking differently: Apply 1 application topically 2 (two) times daily as needed (irritation on skin). On the nose as advised for a week 02/10/14  Yes Mahima Bubba Camp, MD  LINZESS 145 MCG CAPS capsule TAKE 1 CAPSULE BY MOUTH EVERY DAY 10/11/15  Yes Tiffany L Reed, DO  lisinopril (PRINIVIL,ZESTRIL) 10 MG tablet Take 1 tablet (10 mg total) by mouth daily. 04/01/15  Yes Lauree Chandler, NP  LORazepam (ATIVAN) 1 MG tablet Take 1 tablet (1 mg total) by mouth every 8 (eight) hours as needed. for anxiety 08/11/15  Yes Monica Carter, DO  NAMZARIC 28-10 MG CP24 TAKE 1 CAPSULE BY MOUTH EVERY DAY FOR MEMORY 07/14/15  Yes Tiffany L Reed, DO  nitrofurantoin, macrocrystal-monohydrate, (MACROBID) 100 MG capsule TAKE ONE CAPSULE BY MOUTH EVERY DAY FOR UTI PREVENTION 11/30/15  Yes Tiffany L Reed, DO  NUEDEXTA 20-10 MG CAPS TAKE 1 CAPSULE BY MOUTH EVERY 12 HOURS 09/16/15  Yes Tiffany L Reed, DO  QUEtiapine (SEROQUEL) 25 MG tablet TAKE 1 TABLET BY MOUTH IN THE MORNING AND TAKE 3 TABLETS BY MOUTH AT BEDTIME FOR BEHAVIORS AND SLEEP 06/08/15  Yes Tiffany L Reed, DO  simvastatin (ZOCOR) 20 MG tablet TAKE 1 TABLET BY MOUTH EVERY NIGHT  AT BEDTIME. 04/01/15  Yes Lauree Chandler, NP  triamcinolone (KENALOG) 0.025 % ointment APPLY TO SKIN BENEATH RIGHT EAR TWICE DAILY FOR 1 WEEK Patient taking differently: APPLY TO SKIN BENEATH RIGHT EAR TWICE DAILY as needed for irritation 04/20/14  Yes Mahima Pandey, MD  TRINTELLIX 10 MG TABS TAKE 1 TABLET BY MOUTH DAILY 10/11/15  Yes Tiffany L Reed, DO   BP 144/79 mmHg  Pulse 62  Temp(Src)   Resp 18  SpO2 100% Physical Exam  Constitutional: She appears well-developed and well-nourished. No distress.  HENT:  Head: Normocephalic and atraumatic.  Mouth/Throat: No oropharyngeal exudate.  Eyes: Conjunctivae and EOM are normal. Pupils are equal, round,  and reactive to light. Right eye exhibits no discharge. Left eye exhibits no discharge. No scleral icterus.  Cardiovascular: Normal rate, regular rhythm, normal heart sounds and intact distal pulses.  Exam reveals no gallop and no friction rub.   No murmur heard. Pulmonary/Chest: Effort normal and breath sounds normal. No respiratory distress. She has no wheezes. She has no rales. She exhibits no tenderness.  Abdominal: Soft. She exhibits no distension. There is no tenderness. There is no guarding.  Musculoskeletal: Normal range of motion. She exhibits no edema.  Strength 3/5 throughout. No sensory deficits.    Neurological: She is alert.  Pt completely disoriented due to dementia  Skin: Skin is warm and dry. No rash noted. She is not diaphoretic. No erythema. No pallor.  Nursing note and vitals reviewed.   ED Course  Procedures (including critical care time) Labs Review Labs Reviewed  BASIC METABOLIC PANEL - Abnormal; Notable for the following:    Glucose, Bld 143 (*)    All other components within normal limits  CBG MONITORING, ED - Abnormal; Notable for the following:    Glucose-Capillary 153 (*)    All other components within normal limits  CBC  URINALYSIS, ROUTINE W REFLEX MICROSCOPIC (NOT AT [facility])  CBG MONITORING, ED    Imaging Review No results found. I have personally reviewed and evaluated these images and lab results as part of my medical decision-making.   EKG Interpretation   Date/Time:  Wednesday December 01 2015 16:29:45 EDT Ventricular Rate:  59 PR Interval:    QRS Duration: 137 QT Interval:  513 QTC Calculation: 509 R Axis:   -85 Text Interpretation:  Sinus rhythm RBBB and LAFB Confirmed by ZACKOWSKI   MD, SCOTT ([id number]) on 12/01/2015 4:37:16 PM      MDM   Final diagnoses:  Dementia, with behavioral disturbance  Weakness    82 y.o F with advanced dementia presents to the ED with complaints of leg weakness and possible near syncope. No fall or injury  sustained. Family is at bedside and states that pt is back to her baseline. Family states that they do not want large work up of pt or invasive testing as they would likely not want treatment. I feel this is reasonable given pt is at her baseline. Basic screening done. Strength is 2/5 throughout. NO sensory or cranial nerve deficits noted. NO facial drooping or pronator drift. Pt is disoriented in ED, but this is baseline for pt. EKG unremarkable. Labs wnl. Feel that pt is safe for discharge with caregiver and PCP follow up.  Patient was discussed with and seen by Dr. Dayna Barker who agrees with the treatment plan.      Dondra Spry [place], PA-C 12/03/15 1148  Merrily Pew, MD 12/06/15 [phone]

## 2015-12-01 NOTE — ED Notes (Signed)
Pt tried to use restroom was unsuccessful. Will try again shortly.

## 2015-12-01 NOTE — ED Notes (Signed)
Per EMS. Pt from [place] home. Hx of dementia. Pt had near syncopal episode while walking. Was assisted to ground, no falls. No LOC. Oriented per baseline. Denied pain.

## 2015-12-01 NOTE — Discharge Instructions (Signed)
Dementia Dementia is a general term for problems with brain function. A person with dementia has memory loss and a hard time with at least one other brain function such as thinking, speaking, or problem solving. Dementia can affect social functioning, how you do your job, your mood, or your personality. The changes may be hidden for a long time. The earliest forms of this disease are usually not detected by family or friends. Dementia can be:  Irreversible.  Potentially reversible.  Partially reversible.  Progressive. This means it can get worse over time. CAUSES  Irreversible dementia causes may include:  Degeneration of brain cells (Alzheimer disease or Lewy body dementia).  Multiple small strokes (vascular dementia).  Infection (chronic meningitis or Creutzfeldt-Jakob disease).  Frontotemporal dementia. This affects younger people, age 40 to 70, compared to those who have Alzheimer disease.  Dementia associated with other disorders like Parkinson disease, Huntington disease, or HIV-associated dementia. Potentially or partially reversible dementia causes may include:  Medicines.  Metabolic causes such as excessive alcohol intake, vitamin B12 deficiency, or thyroid disease.  Masses or pressure in the brain such as a tumor, blood clot, or hydrocephalus. SIGNS AND SYMPTOMS  Symptoms are often hard to detect. Family members or coworkers may not notice them early in the disease process. Different people with dementia may have different symptoms. Symptoms can include:  A hard time with memory, especially recent memory. Long-term memory may not be impaired.  Asking the same question multiple times or forgetting something someone just said.  A hard time speaking your thoughts or finding certain words.  A hard time solving problems or performing familiar tasks (such as how to use a telephone).  Sudden changes in mood.  Changes in personality, especially increasing moodiness or  mistrust.  Depression.  A hard time understanding complex ideas that were never a problem in the past. DIAGNOSIS  There are no specific tests for dementia.   Your health care provider may recommend a thorough evaluation. This is because some forms of dementia can be reversible. The evaluation will likely include a physical exam and getting a detailed history from you and a family member. The history often gives the best clues and suggestions for a diagnosis.  Memory testing may be done. A detailed brain function evaluation called neuropsychologic testing may be helpful.  Lab tests and brain imaging (such as a CT scan or MRI scan) are sometimes important.  Sometimes observation and re-evaluation over time is very helpful. TREATMENT  Treatment depends on the cause.   If the problem is a vitamin deficiency, it may be helped or cured with supplements.  For dementias such as Alzheimer disease, medicines are available to stabilize or slow the course of the disease. There are no cures for this type of dementia.  Your health care provider can help direct you to groups, organizations, and other health care providers to help with decisions in the care of you or your loved one. HOME CARE INSTRUCTIONS The care of individuals with dementia is varied and dependent upon the progression of the dementia. The following suggestions are intended for the person living with, or caring for, the person with dementia.  Create a safe environment.  Remove the locks on bathroom doors to prevent the person from accidentally locking himself or herself in.  Use childproof latches on kitchen cabinets and any place where cleaning supplies, chemicals, or alcohol are kept.  Use childproof covers in unused electrical outlets.  Install childproof devices to keep doors and windows   secured.  Remove stove knobs or install safety knobs and an automatic shut-off on the stove.  Lower the temperature on water  heaters.  Label medicines and keep them locked up.  Secure knives, lighters, matches, power tools, and guns, and keep these items out of reach.  Keep the house free from clutter. Remove rugs or anything that might contribute to a fall.  Remove objects that might break and hurt the person.  Make sure lighting is good, both inside and outside.  Install grab rails as needed.  Use a monitoring device to alert you to falls or other needs for help.  Reduce confusion.  Keep familiar objects and people around.  Use night lights or dim lights at night.  Label items or areas.  Use reminders, notes, or directions for daily activities or tasks.  Keep a simple, consistent routine for waking, meals, bathing, dressing, and bedtime.  Create a calm, quiet environment.  Place large clocks and calendars prominently.  Display emergency numbers and home address near all telephones.  Use cues to establish different times of the day. An example is to open curtains to let the natural light in during the day.   Use effective communication.  Choose simple words and short sentences.  Use a gentle, calm tone of voice.  Be careful not to interrupt.  If the person is struggling to find a word or communicate a thought, try to provide the word or thought.  Ask one question at a time. Allow the person ample time to answer questions. Repeat the question again if the person does not respond.  Reduce nighttime restlessness.  Provide a comfortable bed.  Have a consistent nighttime routine.  Ensure a regular walking or physical activity schedule. Involve the person in daily activities as much as possible.  Limit napping during the day.  Limit caffeine.  Attend social events that stimulate rather than overwhelm the senses.  Encourage good nutrition and hydration.  Reduce distractions during meal times and snacks.  Avoid foods that are too hot or too cold.  Monitor chewing and swallowing  ability.  Continue with routine vision, hearing, dental, and medical screenings.  Give medicines only as directed by the health care provider.  Monitor driving abilities. Do not allow the person to drive when safe driving is no longer possible.  Register with an identification program which could provide location assistance in the event of a missing person situation. SEEK MEDICAL CARE IF:   New behavioral problems start such as moodiness, aggressiveness, or seeing things that are not there (hallucinations).  Any new problem with brain function happens. This includes problems with balance, speech, or falling a lot.  Problems with swallowing develop.  Any symptoms of other illness happen. Small changes or worsening in any aspect of brain function can be a sign that the illness is getting worse. It can also be a sign of another medical illness such as infection. Seeing a health care provider right away is important. SEEK IMMEDIATE MEDICAL CARE IF:   A fever develops.  New or worsened confusion develops.  New or worsened sleepiness develops.  Staying awake becomes hard to do.   This information is not intended to replace advice given to you by your health care provider. Make sure you discuss any questions you have with your health care provider.   Document Released: 11/22/2000 Document Revised: 06/19/2014 Document Reviewed: 10/24/2010 Elsevier Interactive Patient Education 2016 [employer].  Weakness Weakness is a lack of strength. It may be  felt all over the body (generalized) or in one specific part of the body (focal). Some causes of weakness can be serious. You may need further medical evaluation, especially if you are elderly or you have a history of immunosuppression (such as chemotherapy or HIV), kidney disease, heart disease, or diabetes. CAUSES  Weakness can be caused by many different things, including:  Infection.  Physical exhaustion.  Internal bleeding or other  blood loss that results in a lack of red blood cells (anemia).  Dehydration. This cause is more common in elderly people.  Side effects or electrolyte abnormalities from medicines, such as pain medicines or sedatives.  Emotional distress, anxiety, or depression.  Circulation problems, especially severe peripheral arterial disease.  Heart disease, such as rapid atrial fibrillation, bradycardia, or heart failure.  Nervous system disorders, such as Guillain-Barr syndrome, multiple sclerosis, or stroke. DIAGNOSIS  To find the cause of your weakness, your caregiver will take your history and perform a physical exam. Lab tests or X-rays may also be ordered, if needed. TREATMENT  Treatment of weakness depends on the cause of your symptoms and can vary greatly. HOME CARE INSTRUCTIONS   Rest as needed.  Eat a well-balanced diet.  Try to get some exercise every day.  Only take over-the-counter or prescription medicines as directed by your caregiver. SEEK MEDICAL CARE IF:   Your weakness seems to be getting worse or spreads to other parts of your body.  You develop new aches or pains. SEEK IMMEDIATE MEDICAL CARE IF:   You cannot perform your normal daily activities, such as getting dressed and feeding yourself.  You cannot walk up and down stairs, or you feel exhausted when you do so.  You have shortness of breath or chest pain.  You have difficulty moving parts of your body.  You have weakness in only one area of the body or on only one side of the body.  You have a fever.  You have trouble speaking or swallowing.  You cannot control your bladder or bowel movements.  You have black or bloody vomit or stools. MAKE SURE YOU:  Understand these instructions.  Will watch your condition.  Will get help right away if you are not doing well or get worse.   This information is not intended to replace advice given to you by your health care provider. Make sure you discuss any  questions you have with your health care provider.  Follow up with your primary care provider for re-evaluation. Return to the ED if you experience severe worsening of your symptoms, chest pain, difficulty breathing,, weakness, loss of consciousness.

## 2015-12-30 ENCOUNTER — Other Ambulatory Visit: Payer: Self-pay | Admitting: Nurse Practitioner

## 2016-01-14 ENCOUNTER — Other Ambulatory Visit: Payer: Self-pay | Admitting: Internal Medicine

## 2016-02-09 ENCOUNTER — Telehealth: Payer: Self-pay

## 2016-02-09 MED ORDER — QUETIAPINE FUMARATE 25 MG PO TABS: ORAL_TABLET | ORAL | 5 refills | Status: DC

## 2016-02-09 NOTE — Telephone Encounter (Signed)
Let's go down to 2 seroquel tablets at bedtime and see if she is more alert in the am.

## 2016-02-09 NOTE — Telephone Encounter (Signed)
Charlotte Kelly, caregiver notified and agreed. Medication list updated.

## 2016-02-09 NOTE — Telephone Encounter (Signed)
Patient's caregiver called requesting to speak with triage assistant. Patient is sleeping later in the morning and its getting harder for the caregiver to get patient up and moving.   Patient is currently taking 3 Seroquel's at bedtime. Charlotte Kelly questions if this should be decreased.  Please advise   Last OV  10-07-2015, pending OV 04-06-16

## 2016-03-16 ENCOUNTER — Other Ambulatory Visit: Payer: Self-pay | Admitting: Internal Medicine

## 2016-03-16 DIAGNOSIS — F482 Pseudobulbar affect: Secondary | ICD-10-CM

## 2016-03-25 ENCOUNTER — Other Ambulatory Visit: Payer: Self-pay | Admitting: Internal Medicine

## 2016-04-06 ENCOUNTER — Encounter: Payer: Self-pay | Admitting: Internal Medicine

## 2016-04-06 ENCOUNTER — Ambulatory Visit (INDEPENDENT_AMBULATORY_CARE_PROVIDER_SITE_OTHER): Payer: Medicare Other | Admitting: Internal Medicine

## 2016-04-06 VITALS — BP 128/70 | HR 71 | Ht 60.0 in | Wt 127.0 lb

## 2016-04-06 DIAGNOSIS — F0281 Dementia in other diseases classified elsewhere with behavioral disturbance: Secondary | ICD-10-CM | POA: Diagnosis not present

## 2016-04-06 DIAGNOSIS — F324 Major depressive disorder, single episode, in partial remission: Secondary | ICD-10-CM

## 2016-04-06 DIAGNOSIS — K5900 Constipation, unspecified: Secondary | ICD-10-CM

## 2016-04-06 DIAGNOSIS — Z23 Encounter for immunization: Secondary | ICD-10-CM

## 2016-04-06 DIAGNOSIS — G301 Alzheimer's disease with late onset: Secondary | ICD-10-CM

## 2016-04-06 DIAGNOSIS — I10 Essential (primary) hypertension: Secondary | ICD-10-CM | POA: Diagnosis not present

## 2016-04-06 DIAGNOSIS — F02818 Dementia in other diseases classified elsewhere, unspecified severity, with other behavioral disturbance: Secondary | ICD-10-CM

## 2016-04-06 MED ORDER — LISINOPRIL 10 MG PO TABS: 10.0000 mg | ORAL_TABLET | Freq: Every day | ORAL | 1 refills | Status: DC

## 2016-04-06 MED ORDER — LINACLOTIDE 145 MCG PO CAPS: 145.0000 ug | ORAL_CAPSULE | Freq: Every day | ORAL | 1 refills | Status: DC

## 2016-04-06 MED ORDER — VORTIOXETINE HBR 10 MG PO TABS: 1.0000 | ORAL_TABLET | Freq: Every day | ORAL | 1 refills | Status: DC

## 2016-04-06 NOTE — Progress Notes (Signed)
Location:  [facility] clinic Provider:  Cheyenne Bordeaux L. Mariea Clonts, D.O., C.M.D.  Code Status: DNR Goals of Care:  Advanced Directives 04/06/2016  Does patient have an advance directive? Yes  Type of Advance Directive Living will  Does patient want to make changes to advanced directive? -  Copy of advanced directive(s) in chart? Yes  Pre-existing out of facility DNR order (yellow form or pink MOST form) -   Chief Complaint  Patient presents with  . Annual Exam    wellness exam    HPI: Patient is a 82 y.o. female seen today for medical management of chronic diseases.    No fall, good mood, sleeps all night.  Likes to pick on herself and others.  No new concerns.  Down 5 lbs from April.  Eating well.  No pain concerns or wincing.  Bowels are moving well with linzess.  Has large soft bms.  No melena or hematochezia.  Still has cycles of happiness, behavior, sadness.    Past Medical History:  Diagnosis Date  . Alzheimer disease   . Anxiety   . Arthritis   . Cataract   . Dementia in conditions classified elsewhere with behavioral disturbance   . Depression   . Encounter for screening fecal occult blood testing 09/19/2012  . Hypertension   . Insomnia   . Insomnia, unspecified   . Other alteration of consciousness     Past Surgical History:  Procedure Laterality Date  . CHOLECYSTECTOMY    . gallblader    . SMALL INTESTINE SURGERY      No Known Allergies    Medication List       Accurate as of 04/06/16  2:16 PM. Always use your most recent med list.          aspirin 81 MG tablet Take 81 mg by mouth daily.   LINZESS 145 MCG Caps capsule Generic drug:  linaclotide TAKE 1 CAPSULE BY MOUTH EVERY DAY   lisinopril 10 MG tablet Commonly known as:  PRINIVIL,ZESTRIL TAKE 1 TABLET(10 MG) BY MOUTH DAILY   LORazepam 1 MG tablet Commonly known as:  ATIVAN Take 1 tablet (1 mg total) by mouth every 8 (eight) hours as needed. for anxiety   NAMZARIC 28-10 MG Cp24 Generic drug:   Memantine HCl-Donepezil HCl TAKE 1 CAPSULE BY MOUTH EVERY DAY FOR MEMORY   nitrofurantoin (macrocrystal-monohydrate) 100 MG capsule Commonly known as:  MACROBID TAKE ONE CAPSULE BY MOUTH EVERY DAY FOR UTI PREVENTION   NUEDEXTA 20-10 MG Caps Generic drug:  Dextromethorphan-Quinidine TAKE 1 CAPSULE BY MOUTH EVERY 12 HOURS   QUEtiapine 25 MG tablet Commonly known as:  SEROQUEL Take one tablet by mouth in the morning and take two tablets by mouth at bedtime for behaviors and sleep.   simvastatin 20 MG tablet Commonly known as:  ZOCOR TAKE 1 TABLET BY MOUTH EVERY NIGHT AT BEDTIME   triamcinolone 0.025 % ointment Commonly known as:  KENALOG Apply 1 application topically 2 (two) times daily.   TRINTELLIX 10 MG Tabs Generic drug:  vortioxetine HBr TAKE 1 TABLET BY MOUTH DAILY       Review of Systems: from caregiver and husband Review of Systems  Constitutional: Positive for weight loss. Negative for chills, fever and malaise/fatigue.  HENT: Negative for congestion.   Eyes: Negative for blurred vision.  Respiratory: Negative for shortness of breath.   Cardiovascular: Negative for chest pain.  Gastrointestinal: Negative for abdominal pain, blood in stool, constipation and melena.  Genitourinary: Negative for dysuria.  Musculoskeletal: Negative for falls.  Skin: Negative for itching and rash.  Neurological: Positive for weakness. Negative for dizziness and loss of consciousness.  Endo/Heme/Allergies: Bruises/bleeds easily.  Psychiatric/Behavioral: Positive for depression and memory loss. The patient is nervous/anxious. The patient does not have insomnia.     Health Maintenance  Topic Date Due  . INFLUENZA VACCINE  01/11/2016  . DEXA SCAN  06/12/2018 (Originally 10/22/1996)  . ZOSTAVAX  06/12/2018 (Originally 10/23/1991)  . PNA vac Low Risk Adult (2 of 2 - PPSV23) 10/06/2016  . TETANUS/TDAP  08/09/2024    Physical Exam: Vitals:   04/06/16 1347  BP: 128/70  Pulse: 71    SpO2: 95%  Weight: 127 lb (57.6 kg)  Height: 5' (1.524 m)   Body mass index is 24.8 kg/m. Physical Exam  Constitutional: No distress.  Thin female, nad  Cardiovascular: Normal rate, regular rhythm, normal heart sounds and intact distal pulses.   Pulmonary/Chest: Effort normal and breath sounds normal. No respiratory distress.  Abdominal: Soft. Bowel sounds are normal. She exhibits no distension. There is no tenderness.  Musculoskeletal: Normal range of motion.  Neurological: She is alert.  Speaks a few words but not intelligible sentences  Skin: Skin is warm and dry.  Psychiatric:  Still labile mood    Labs reviewed: Basic Metabolic Panel:  Recent Labs  12/01/15 1657  NA 141  K 4.3  CL 105  CO2 26  GLUCOSE 143*  BUN 17  CREATININE 0.80  CALCIUM 9.0   Liver Function Tests: No results for input(s): AST, ALT, ALKPHOS, BILITOT, PROT, ALBUMIN in the last 8760 hours. No results for input(s): LIPASE, AMYLASE in the last 8760 hours. No results for input(s): AMMONIA in the last 8760 hours. CBC:  Recent Labs  12/01/15 1657  WBC 8.0  HGB 12.9  HCT 39.8  MCV 93.9  PLT 186   Lipid Panel: No results for input(s): CHOL, HDL, LDLCALC, TRIG, CHOLHDL, LDLDIRECT in the last 8760 hours. Lab Results  Component Value Date   HGBA1C 5.9 (H) 03/18/2013    Assessment/Plan 1. Late onset Alzheimer's disease with behavioral disturbance -late stage, dependent in adls, still verbal, but less, still ambulatory with assistance of another person, still has oscillating periods of behaviors and moods   2. Essential hypertension -bp at goal, no changes  3. Constipation, unspecified constipation type -well managed with linzess, no changes  4. Major depressive disorder with single episode, in partial remission ([place]) -continue brintellix and seroquel which have helped calm her along with a consistent caregiving staff  5. Need for prophylactic vaccination and inoculation against  influenza -flu shot given  Labs/tests ordered:  No orders of the defined types were placed in this encounter.  Next appt:  6 mos med mgt and prn  Aarti Mankowski L. Karis Rilling, D.O. [facility] [address], [place] [address] Cell Phone (Mon-Fri 8am-5pm):  [phone] On Call:  [phone] & follow prompts after 5pm & weekends Office Phone:  [phone] Office Fax:  [phone]

## 2016-04-23 ENCOUNTER — Other Ambulatory Visit: Payer: Self-pay | Admitting: Internal Medicine

## 2016-05-22 ENCOUNTER — Other Ambulatory Visit: Payer: Self-pay | Admitting: Internal Medicine

## 2016-06-07 ENCOUNTER — Other Ambulatory Visit: Payer: Self-pay | Admitting: Internal Medicine

## 2016-06-07 DIAGNOSIS — F482 Pseudobulbar affect: Secondary | ICD-10-CM

## 2016-06-08 ENCOUNTER — Other Ambulatory Visit: Payer: Self-pay | Admitting: Internal Medicine

## 2016-07-05 ENCOUNTER — Other Ambulatory Visit: Payer: Self-pay | Admitting: Nurse Practitioner

## 2016-07-05 NOTE — Telephone Encounter (Signed)
rx sent to pharmacy by e-script  

## 2016-07-09 ENCOUNTER — Other Ambulatory Visit: Payer: Self-pay | Admitting: Internal Medicine

## 2016-07-09 DIAGNOSIS — T148XXA Other injury of unspecified body region, initial encounter: Secondary | ICD-10-CM

## 2016-07-10 ENCOUNTER — Telehealth: Payer: Self-pay

## 2016-07-10 NOTE — Telephone Encounter (Signed)
Patient's caregiver called c/o Linzess being ineffective. Patient is averaging 1 bowel movement per week. Charlotte Kelly has increased fiber in patient's diet and gives patient warm prune juice twice daily.  Please advise

## 2016-07-10 NOTE — Telephone Encounter (Signed)
Will she drink miralax in 8oz of juice?

## 2016-07-10 NOTE — Telephone Encounter (Signed)
Spoke with patient's caregiver, patient uses ointment for areas that she picks at. Patient picks her skin on her face and left wrist.  Dr.Reed please advise, previous instructions states use x 1 week for ear

## 2016-07-11 NOTE — Telephone Encounter (Signed)
For now, continue linzess.  If they find she is having too many or too loose of bms, stop the linzess at that point.

## 2016-07-11 NOTE — Telephone Encounter (Signed)
Caregiver notified and agreed.  

## 2016-07-11 NOTE — Telephone Encounter (Signed)
Patient will drink Miralax. They will try it in some orange juice. Wants to know if she should continue the [place]. Please Advise.

## 2016-07-18 ENCOUNTER — Other Ambulatory Visit: Payer: Self-pay | Admitting: Internal Medicine

## 2016-07-19 ENCOUNTER — Other Ambulatory Visit: Payer: Self-pay | Admitting: Internal Medicine

## 2016-09-03 ENCOUNTER — Other Ambulatory Visit: Payer: Self-pay | Admitting: Internal Medicine

## 2016-09-11 ENCOUNTER — Other Ambulatory Visit: Payer: Self-pay | Admitting: Internal Medicine

## 2016-09-11 DIAGNOSIS — F482 Pseudobulbar affect: Secondary | ICD-10-CM

## 2016-09-14 ENCOUNTER — Other Ambulatory Visit: Payer: Self-pay | Admitting: Internal Medicine

## 2016-09-20 ENCOUNTER — Telehealth: Payer: Self-pay | Admitting: *Deleted

## 2016-09-20 NOTE — Telephone Encounter (Signed)
Received fax from [facility] [phone] and Nitrofurantoin was APPROVED from 09/10/16-06/11/17.  Member ID#: [id number] Caregiver Notified.

## 2016-09-20 NOTE — Telephone Encounter (Signed)
Received prior Authorization for patient's Nitrofurantoin. Initiated through [employer] [facility] [place]. Awaiting Determination.  Vincente Liberty, caregiver notified and agreed.

## 2016-10-05 ENCOUNTER — Other Ambulatory Visit: Payer: Self-pay | Admitting: Internal Medicine

## 2016-10-05 ENCOUNTER — Ambulatory Visit: Payer: Medicare Other | Admitting: Internal Medicine

## 2016-10-09 ENCOUNTER — Ambulatory Visit: Payer: Medicare Other | Admitting: Internal Medicine

## 2016-10-15 ENCOUNTER — Other Ambulatory Visit: Payer: Self-pay | Admitting: Internal Medicine

## 2016-10-18 ENCOUNTER — Other Ambulatory Visit: Payer: Self-pay | Admitting: Internal Medicine

## 2016-10-19 ENCOUNTER — Other Ambulatory Visit: Payer: Self-pay | Admitting: Internal Medicine

## 2016-10-20 ENCOUNTER — Ambulatory Visit: Payer: Medicare Other | Admitting: Internal Medicine

## 2016-11-13 ENCOUNTER — Encounter: Payer: Self-pay | Admitting: Internal Medicine

## 2016-11-13 ENCOUNTER — Ambulatory Visit (INDEPENDENT_AMBULATORY_CARE_PROVIDER_SITE_OTHER): Payer: Medicare Other | Admitting: Internal Medicine

## 2016-11-13 VITALS — BP 138/70 | HR 62 | Temp 98.4°F | Wt 117.0 lb

## 2016-11-13 DIAGNOSIS — G301 Alzheimer's disease with late onset: Secondary | ICD-10-CM | POA: Diagnosis not present

## 2016-11-13 DIAGNOSIS — F0281 Dementia in other diseases classified elsewhere with behavioral disturbance: Secondary | ICD-10-CM

## 2016-11-13 DIAGNOSIS — I10 Essential (primary) hypertension: Secondary | ICD-10-CM | POA: Diagnosis not present

## 2016-11-13 DIAGNOSIS — R634 Abnormal weight loss: Secondary | ICD-10-CM | POA: Insufficient documentation

## 2016-11-13 DIAGNOSIS — K5900 Constipation, unspecified: Secondary | ICD-10-CM

## 2016-11-13 DIAGNOSIS — Z23 Encounter for immunization: Secondary | ICD-10-CM | POA: Diagnosis not present

## 2016-11-13 DIAGNOSIS — F324 Major depressive disorder, single episode, in partial remission: Secondary | ICD-10-CM | POA: Insufficient documentation

## 2016-11-13 DIAGNOSIS — F482 Pseudobulbar affect: Secondary | ICD-10-CM

## 2016-11-13 DIAGNOSIS — F02818 Dementia in other diseases classified elsewhere, unspecified severity, with other behavioral disturbance: Secondary | ICD-10-CM

## 2016-11-13 MED ORDER — QUETIAPINE FUMARATE 25 MG PO TABS: ORAL_TABLET | ORAL | 0 refills | Status: DC

## 2016-11-13 MED ORDER — ZOSTER VAC RECOMB ADJUVANTED 50 MCG/0.5ML IM SUSR: 0.5000 mL | Freq: Once | INTRAMUSCULAR | 1 refills | Status: AC

## 2016-11-13 NOTE — Progress Notes (Signed)
Location:  [facility] clinic Provider:  Mariah Gerstenberger L. Mariea Clonts, D.O., C.M.D.  Code Status: DNR Goals of Care:  Advanced Directives 04/06/2016  Does Patient Have a Medical Advance Directive? Yes  Type of Advance Directive Living will  Does patient want to make changes to medical advance directive? -  Copy of [place] in Chart? Yes  Pre-existing out of facility DNR order (yellow form or pink MOST form) -   Chief Complaint  Patient presents with  . Medical Management of Chronic Issues    45mth follow-up   HPI: Patient is a 83 y.o. female seen today for medical management of chronic diseases.  She is here with her caregiver and her husband.  The caregiver is very negative toward pt's husband.  Nothing really new.  She seems to be talking less.  She comes in a wheelchair--has to be pushed, no longer ambulatory.  She is losing weight down to 117 lbs (10 lbs from October of last year).  .  She is eating well.   She suddenly started to grunt and apparently was having a BM.  Pt distracted by what's going on in her head.   Carlee continues to sleep well at night.  She does not get up at night.  She wakes up about an hour before her caregiver arrives, but will lie back down until she arrives.   No falls.  She seems to have a sound constitution per her husband.  She does well with each caregiver.  Stable moods with namzaric, nuedexta, seroquel, and trintellix.  Still on ativan also for anxiety which is not used frequently.  Only on 2 tabs of the seroquel at hs.    BP satisfactory given comfort goals of care.  On lisinopril.  Is having BMs with linzess.  Drinking water with artificial sweetener in it.    Still on zocor and asa for stroke prevention and hyperlipidemia. Discussed with her husband and caregiver about potentially stopping the zocor favoring comfort over longevity at this stage.  She is not having difficulty with meds; however, as far as they report.    Past Medical History:    Diagnosis Date  . Alzheimer disease   . Anxiety   . Arthritis   . Cataract   . Dementia in conditions classified elsewhere with behavioral disturbance   . Depression   . Encounter for screening fecal occult blood testing 09/19/2012  . Hypertension   . Insomnia   . Insomnia, unspecified   . Other alteration of consciousness     Past Surgical History:  Procedure Laterality Date  . CHOLECYSTECTOMY    . gallblader    . SMALL INTESTINE SURGERY      No Known Allergies  Allergies as of 11/13/2016   No Known Allergies     Medication List       Accurate as of 11/13/16  1:16 PM. Always use your most recent med list.          aspirin 81 MG tablet Take 81 mg by mouth daily.   LINZESS 145 MCG Caps capsule Generic drug:  linaclotide TAKE 1 CAPSULE(145 MCG) BY MOUTH DAILY   lisinopril 10 MG tablet Commonly known as:  PRINIVIL,ZESTRIL Take 1 tablet (10 mg total) by mouth daily.   LORazepam 1 MG tablet Commonly known as:  ATIVAN TAKE 1 TABLET BY MOUTH EVERY 8 HOURS AS NEEDED FOR ANXIETY   NAMZARIC 28-10 MG Cp24 Generic drug:  Memantine HCl-Donepezil HCl TAKE 1 CAPSULE BY MOUTH  EVERY DAY FOR MEMORY   nitrofurantoin (macrocrystal-monohydrate) 100 MG capsule Commonly known as:  MACROBID TAKE ONE CAPSULE BY MOUTH EVERY DAY FOR UTI PREVENTION   NUEDEXTA 20-10 MG Caps Generic drug:  Dextromethorphan-Quinidine TAKE 1 CAPSULE BY MOUTH EVERY 12 HOURS   QUEtiapine 25 MG tablet Commonly known as:  SEROQUEL TAKE 1 TABLET BY MOUTH IN THE MORNING AND TAKE 3 TABLETS BY MOUTH AT BEDTIME FOR BEHAVIORS AND SLEEP   simvastatin 20 MG tablet Commonly known as:  ZOCOR TAKE 1 TABLET BY MOUTH EVERY NIGHT AT BEDTIME   triamcinolone 0.025 % ointment Commonly known as:  KENALOG Apply 1 application topically 2 (two) times daily.   triamcinolone 0.025 % ointment Commonly known as:  KENALOG Apply topically daily as needed. Affected skin areas   TRINTELLIX 10 MG Tabs Generic drug:   vortioxetine HBr TAKE 1 TABLET(10 MG) BY MOUTH DAILY       Review of Systems:  Review of Systems  Reason unable to perform ROS: per caregiver.  Constitutional: Negative for chills, fever and malaise/fatigue.  HENT: Negative for congestion.   Eyes: Negative for blurred vision.  Respiratory: Negative for shortness of breath.   Cardiovascular: Negative for chest pain and leg swelling.  Gastrointestinal: Positive for constipation. Negative for abdominal pain, blood in stool and melena.       Moves bowels with linzess  Genitourinary: Negative for dysuria.  Musculoskeletal: Negative for falls and joint pain.  Skin: Negative for rash.  Neurological: Negative for dizziness, loss of consciousness and weakness.  Psychiatric/Behavioral: Positive for hallucinations and memory loss. Negative for depression. The patient is nervous/anxious. The patient does not have insomnia.     Health Maintenance  Topic Date Due  . PNA vac Low Risk Adult (2 of 2 - PPSV23) 10/06/2016  . DEXA SCAN  06/12/2018 (Originally 10/22/1996)  . INFLUENZA VACCINE  01/10/2017  . TETANUS/TDAP  08/09/2024    Physical Exam: Vitals:   11/13/16 1308  BP: 138/70  Pulse: 62  Temp: 98.4 F (36.9 C)  TempSrc: Oral  SpO2: 94%  Weight: 117 lb (53.1 kg)   Body mass index is 22.85 kg/m. Physical Exam  Constitutional: No distress.  Cardiovascular: Normal rate, regular rhythm, normal heart sounds and intact distal pulses.   Pulmonary/Chest: Effort normal and breath sounds normal. No respiratory distress.  Abdominal: Soft. Bowel sounds are normal. She exhibits no distension. There is no tenderness.  Musculoskeletal: Normal range of motion.  Comes in wheelchair now  Neurological: She is alert.  Skin: Skin is warm and dry.  Psychiatric:  In her own world looking at magazine today    Labs reviewed: Basic Metabolic Panel:  Recent Labs  12/01/15 1657  NA 141  K 4.3  CL 105  CO2 26  GLUCOSE 143*  BUN 17    CREATININE 0.80  CALCIUM 9.0   Liver Function Tests: No results for input(s): AST, ALT, ALKPHOS, BILITOT, PROT, ALBUMIN in the last 8760 hours. No results for input(s): LIPASE, AMYLASE in the last 8760 hours. No results for input(s): AMMONIA in the last 8760 hours. CBC:  Recent Labs  12/01/15 1657  WBC 8.0  HGB 12.9  HCT 39.8  MCV 93.9  PLT 186   Lipid Panel: No results for input(s): CHOL, HDL, LDLCALC, TRIG, CHOLHDL, LDLDIRECT in the last 8760 hours. Lab Results  Component Value Date   HGBA1C 5.9 (H) 03/18/2013    Assessment/Plan 1. Late onset Alzheimer's disease with behavioral disturbance - continues on namzaric and 24x7 care  by caregivers or husband, lives at [facility] - has seroquel for hallucinations and psychosis - QUEtiapine (SEROQUEL) 25 MG tablet; One tablet daily each morning and two tablets at bedtime for sleep and behavior  Dispense: 270 tablet; Refill: 0  2. Major depressive disorder with single episode, in partial remission ([place]) - cont trintellix and seroquel--only needs two tabs at hs, and one in am - QUEtiapine (SEROQUEL) 25 MG tablet; One tablet daily each morning and two tablets at bedtime for sleep and behavior  Dispense: 270 tablet; Refill: 0  3. Pseudobulbar affect - continues on nuedexta bid which does help calm her per caregiver and keep outbursts down -is active in activities except exercise which gets her overstimulated and agitated - QUEtiapine (SEROQUEL) 25 MG tablet; One tablet daily each morning and two tablets at bedtime for sleep and behavior  Dispense: 270 tablet; Refill: 0  4. Constipation, unspecified constipation type -cont linzess, hydration which are working well  5. Essential hypertension -bp well controlled with lisinopril  6. Weight loss -due to progression of her dementia, eats well and takes boost supplements also, has great caregiver support  7. Need for shingles vaccine - Zoster Vac Recomb Adjuvanted [facility])  injection; Inject 0.5 mLs into the muscle once.  Dispense: 0.5 mL; Refill: 1 sent to pharmacy -reviewed with caregiver and husband  Labs/tests ordered: No orders of the defined types were placed in this encounter.  Next appt:  6 mos for med mgt, AWV  Kyara Boxer L. Vanita Cannell, D.O. [facility] [address], [place] [address] Cell Phone (Mon-Fri 8am-5pm):  [phone] On Call:  [phone] & follow prompts after 5pm & weekends Office Phone:  [phone] Office Fax:  [phone]

## 2016-11-30 ENCOUNTER — Other Ambulatory Visit: Payer: Self-pay | Admitting: Internal Medicine

## 2016-12-14 ENCOUNTER — Other Ambulatory Visit: Payer: Self-pay | Admitting: Internal Medicine

## 2016-12-14 DIAGNOSIS — F482 Pseudobulbar affect: Secondary | ICD-10-CM

## 2016-12-28 ENCOUNTER — Other Ambulatory Visit: Payer: Self-pay | Admitting: Internal Medicine

## 2017-01-09 ENCOUNTER — Encounter (HOSPITAL_COMMUNITY): Payer: Self-pay

## 2017-01-09 ENCOUNTER — Emergency Department (HOSPITAL_COMMUNITY)
Admission: EM | Admit: 2017-01-09 | Discharge: 2017-01-09 | Disposition: A | Discharge status: Home / Self Care | Payer: Medicare Other | Attending: Emergency Medicine | Admitting: Emergency Medicine

## 2017-01-09 DIAGNOSIS — Z79899 Other long term (current) drug therapy: Secondary | ICD-10-CM | POA: Insufficient documentation

## 2017-01-09 DIAGNOSIS — R251 Tremor, unspecified: Secondary | ICD-10-CM | POA: Diagnosis not present

## 2017-01-09 DIAGNOSIS — Z7982 Long term (current) use of aspirin: Secondary | ICD-10-CM | POA: Diagnosis not present

## 2017-01-09 DIAGNOSIS — I10 Essential (primary) hypertension: Secondary | ICD-10-CM | POA: Insufficient documentation

## 2017-01-09 DIAGNOSIS — R404 Transient alteration of awareness: Secondary | ICD-10-CM | POA: Diagnosis not present

## 2017-01-09 DIAGNOSIS — I6789 Other cerebrovascular disease: Secondary | ICD-10-CM | POA: Diagnosis not present

## 2017-01-09 DIAGNOSIS — Z87891 Personal history of nicotine dependence: Secondary | ICD-10-CM | POA: Insufficient documentation

## 2017-01-09 NOTE — ED Provider Notes (Signed)
[place] DEPT Provider Note   CSN: [id number] Arrival date & time: 01/09/17  1419     History   Chief Complaint Chief Complaint  Patient presents with  . Tremors    HPI Charlotte Kelly is a 83 y.o. female.  HPI  Patient presents with her daughter, husband and a caregiver who provide history of present illness. Patient has a history of dementia, level V caveat. The patient herself is minimally interactive, and when she does offer verbal responses have very brief, inconsistently appropriate. Family notes that the patient has been in her usual state of health, no recent medication changes, diet changes. Today, for approximately 20 minutes the patient had a period of change in interactivity, with shaking, minimal interaction, and not responding to direct stimuli. There is no clear precipitant, and symptoms improved prior to the arrival of EMS personnel. Caregiver, who was with patient the times today states the patient is back to normal now. Family does note the patient is an ongoing dental issues for which she is going to the dentist tomorrow.    Past Medical History:  Diagnosis Date  . Alzheimer disease   . Anxiety   . Arthritis   . Cataract   . Dementia in conditions classified elsewhere with behavioral disturbance   . Depression   . Encounter for screening fecal occult blood testing 09/19/2012  . Hypertension   . Insomnia   . Insomnia, unspecified   . Other alteration of consciousness     Patient Active Problem List   Diagnosis Date Noted  . Major depressive disorder with single episode, in partial remission ([place]) 11/13/2016  . Weight loss 11/13/2016  . Essential hypertension 10/07/2015  . Folliculitis of nose 44/31/5400  . Xerosis of skin 02/10/2014  . Constipation 12/04/2013  . Pseudobulbar affect 09/04/2013  . Alzheimer's disease 09/04/2013  . Stye external 09/04/2013  . Hyperlipidemia 09/19/2012  . Recurrent UTI 09/19/2012  . Anxiety   . Insomnia      Past Surgical History:  Procedure Laterality Date  . CHOLECYSTECTOMY    . gallblader    . SMALL INTESTINE SURGERY      OB History    No data available       Home Medications    Prior to Admission medications   Medication Sig Start Date End Date Taking? Authorizing Provider  aspirin 81 MG tablet Take 81 mg by mouth daily.    Yes [provider]  LINZESS 145 MCG CAPS capsule TAKE 1 CAPSULE(145 MCG) BY MOUTH DAILY 10/05/16  Yes Reed, Tiffany L, DO  lisinopril (PRINIVIL,ZESTRIL) 10 MG tablet TAKE 1 TABLET(10 MG) BY MOUTH DAILY 12/28/16  Yes Reed, Tiffany L, DO  LORazepam (ATIVAN) 1 MG tablet TAKE 1 TABLET BY MOUTH EVERY 8 HOURS AS NEEDED FOR ANXIETY 06/08/16  Yes Eubanks, Carlos American, NP  NAMZARIC 28-10 MG CP24 TAKE 1 CAPSULE BY MOUTH EVERY DAY FOR MEMORY 10/16/16  Yes Reed, Tiffany L, DO  nitrofurantoin, macrocrystal-monohydrate, (MACROBID) 100 MG capsule TAKE ONE CAPSULE BY MOUTH EVERY DAY FOR UTI PREVENTION 10/19/16  Yes Reed, Tiffany L, DO  NUEDEXTA 20-10 MG CAPS TAKE 1 CAPSULE BY MOUTH EVERY 12 HOURS 12/14/16  Yes Reed, Tiffany L, DO  QUEtiapine (SEROQUEL) 25 MG tablet One tablet daily each morning and two tablets at bedtime for sleep and behavior Patient taking differently: Take 50 mg by mouth at bedtime. One tablet daily each morning and two tablets at bedtime for sleep and behavior 11/13/16  Yes Reed, Tiffany L, DO  simvastatin (ZOCOR) 20 MG tablet TAKE 1 TABLET BY MOUTH EVERY NIGHT AT BEDTIME 10/05/16  Yes Reed, Tiffany L, DO  TRINTELLIX 10 MG TABS TAKE 1 TABLET(10 MG) BY MOUTH DAILY 12/28/16  Yes Reed, Tiffany L, DO  triamcinolone (KENALOG) 0.025 % ointment Apply topically daily as needed. Affected skin areas 07/10/16   Gayland Curry, DO    Family History Family History  Problem Relation Age of Onset  . Heart failure Father     Social History Social History  Substance Use Topics  . Smoking status: Former Research scientist (life sciences)  . Smokeless tobacco: Never Used  . Alcohol use No      Allergies   Patient has no known allergies.   Review of Systems Review of Systems  Unable to perform ROS: Dementia     Physical Exam Updated Vital Signs There were no vitals taken for this visit.  Physical Exam  Constitutional: She appears well-developed and well-nourished. No distress.  Thin elderly female sitting upright in no distress, but essentially noninteractive  HENT:  Head: Normocephalic and atraumatic.  Mouth/Throat:    Eyes: Conjunctivae and EOM are normal.  Cardiovascular: Normal rate and regular rhythm.   Murmur heard. Pulmonary/Chest: Effort normal and breath sounds normal. No stridor. No respiratory distress.  Abdominal: She exhibits no distension. There is no tenderness.  Musculoskeletal: She exhibits no edema.  Neurological: She is alert. No cranial nerve deficit.  Patient is minimally interactive, speech is inconsistent, occasionally appropriate. No facial asymmetry. Patient seems to guard and/or not move her left arm voluntarily, but can hold it against resistance, and he has to gravity.  Skin: Skin is warm and dry.  Psychiatric: She is withdrawn. Cognition and memory are impaired.  Nursing note and vitals reviewed.    ED Treatments / Results  Labs (all labs ordered are listed, but only abnormal results are displayed) Labs Reviewed  BASIC METABOLIC PANEL  CBC WITH DIFFERENTIAL/PLATELET  URINALYSIS, ROUTINE W REFLEX MICROSCOPIC   2 attempts to collect blood were unsuccessful.  Procedures Procedures (including critical care time)    Initial Impression / Assessment and Plan / ED Course  I have reviewed the triage vital signs and the nursing notes.  Pertinent labs & imaging results that were available during my care of the patient were reviewed by me and considered in my medical decision making (see chart for details).  5:59 PM Patient:, In no distress, smiling. She has been here for several hours with no additional episodes similar  to when she expects at home. I had a lengthy conversation with patient's daughter, caregiver, and husband about evaluation as far. Discussed possibilities for the episode including TIA, sleeping, or signs of infection. Patient is already on appropriate medical management for TIA, has no other evidence for infection, and is scheduled to see a dentist tomorrow, who will perform repeat exam on the patient's teeth, to evaluate for possible dental etiology. We discussed the risks and benefits of additional attempts to obtain blood, interventions, and agreed that additional efforts to obtain blood without substantially changed patient's evaluation here in the emergency department. Patient discharged in stable condition with next day dental follow-up, and close outpatient primary care follow-up.  Final Clinical Impressions(s) / ED Diagnoses   Final diagnoses:  Transient alteration of awareness     Carmin Muskrat, MD 01/09/17 1800

## 2017-01-09 NOTE — ED Notes (Signed)
Family members were ready to leave, did not get to re-check VS

## 2017-01-09 NOTE — Discharge Instructions (Signed)
As discussed, your evaluation today has been largely reassuring.  But, it is important that you monitor your condition carefully, and do not hesitate to return to the ED if you develop new, or concerning changes in your condition. ? ?Otherwise, please follow-up with your physician for appropriate ongoing care. ? ?

## 2017-01-09 NOTE — ED Notes (Signed)
Vanita Panda EDP at bedside.

## 2017-01-09 NOTE — ED Notes (Signed)
Family and caregiver at bedside.

## 2017-01-09 NOTE — ED Notes (Signed)
Attempted to obtain blood tests x 2 without success, Vanita Panda EDP made aware

## 2017-01-09 NOTE — ED Notes (Signed)
Bed: WA11 Expected date:  Expected time:  Means of arrival:  Comments: Hall B 

## 2017-01-09 NOTE — ED Triage Notes (Signed)
Per EMS, patient came from [facility] EMS called for caregiver noticing patient was shaking her arms and legs and doing a "facial thing" with her mouth. No LOC. No hx of seizures. Hx of alzheimers/dementia. Pt does not follow commands and only mumbles.

## 2017-01-10 ENCOUNTER — Observation Stay (HOSPITAL_COMMUNITY)
Admission: EM | Admit: 2017-01-10 | Discharge: 2017-01-11 | Disposition: A | Discharge status: Home / Self Care | Payer: Medicare Other | Attending: Internal Medicine | Admitting: Internal Medicine

## 2017-01-10 ENCOUNTER — Encounter (HOSPITAL_COMMUNITY): Payer: Self-pay | Admitting: Emergency Medicine

## 2017-01-10 ENCOUNTER — Ambulatory Visit (INDEPENDENT_AMBULATORY_CARE_PROVIDER_SITE_OTHER): Payer: Medicare Other | Admitting: Internal Medicine

## 2017-01-10 ENCOUNTER — Ambulatory Visit: Payer: Self-pay | Admitting: Nurse Practitioner

## 2017-01-10 ENCOUNTER — Encounter: Payer: Self-pay | Admitting: Internal Medicine

## 2017-01-10 ENCOUNTER — Telehealth: Payer: Self-pay | Admitting: *Deleted

## 2017-01-10 ENCOUNTER — Emergency Department (HOSPITAL_COMMUNITY): Payer: Medicare Other

## 2017-01-10 VITALS — BP 98/58 | HR 65 | Temp 96.6°F | Ht 60.0 in

## 2017-01-10 DIAGNOSIS — F482 Pseudobulbar affect: Secondary | ICD-10-CM

## 2017-01-10 DIAGNOSIS — I10 Essential (primary) hypertension: Secondary | ICD-10-CM | POA: Insufficient documentation

## 2017-01-10 DIAGNOSIS — F329 Major depressive disorder, single episode, unspecified: Secondary | ICD-10-CM | POA: Diagnosis not present

## 2017-01-10 DIAGNOSIS — K5900 Constipation, unspecified: Secondary | ICD-10-CM

## 2017-01-10 DIAGNOSIS — Z87891 Personal history of nicotine dependence: Secondary | ICD-10-CM | POA: Diagnosis not present

## 2017-01-10 DIAGNOSIS — M26602 Left temporomandibular joint disorder, unspecified: Secondary | ICD-10-CM | POA: Diagnosis not present

## 2017-01-10 DIAGNOSIS — F419 Anxiety disorder, unspecified: Secondary | ICD-10-CM | POA: Diagnosis not present

## 2017-01-10 DIAGNOSIS — R404 Transient alteration of awareness: Secondary | ICD-10-CM | POA: Diagnosis not present

## 2017-01-10 DIAGNOSIS — Z87440 Personal history of urinary (tract) infections: Secondary | ICD-10-CM | POA: Insufficient documentation

## 2017-01-10 DIAGNOSIS — N390 Urinary tract infection, site not specified: Secondary | ICD-10-CM | POA: Insufficient documentation

## 2017-01-10 DIAGNOSIS — G4700 Insomnia, unspecified: Secondary | ICD-10-CM | POA: Diagnosis not present

## 2017-01-10 DIAGNOSIS — G9341 Metabolic encephalopathy: Secondary | ICD-10-CM | POA: Insufficient documentation

## 2017-01-10 DIAGNOSIS — D696 Thrombocytopenia, unspecified: Secondary | ICD-10-CM | POA: Diagnosis not present

## 2017-01-10 DIAGNOSIS — R9431 Abnormal electrocardiogram [ECG] [EKG]: Secondary | ICD-10-CM | POA: Diagnosis not present

## 2017-01-10 DIAGNOSIS — N3000 Acute cystitis without hematuria: Secondary | ICD-10-CM

## 2017-01-10 DIAGNOSIS — E860 Dehydration: Secondary | ICD-10-CM | POA: Insufficient documentation

## 2017-01-10 DIAGNOSIS — Z7982 Long term (current) use of aspirin: Secondary | ICD-10-CM | POA: Insufficient documentation

## 2017-01-10 DIAGNOSIS — G253 Myoclonus: Secondary | ICD-10-CM | POA: Diagnosis not present

## 2017-01-10 DIAGNOSIS — I4581 Long QT syndrome: Secondary | ICD-10-CM | POA: Insufficient documentation

## 2017-01-10 DIAGNOSIS — Z9049 Acquired absence of other specified parts of digestive tract: Secondary | ICD-10-CM | POA: Insufficient documentation

## 2017-01-10 DIAGNOSIS — M1990 Unspecified osteoarthritis, unspecified site: Secondary | ICD-10-CM | POA: Insufficient documentation

## 2017-01-10 DIAGNOSIS — R402421 Glasgow coma scale score 9-12, in the field [EMT or ambulance]: Secondary | ICD-10-CM | POA: Diagnosis not present

## 2017-01-10 DIAGNOSIS — I959 Hypotension, unspecified: Secondary | ICD-10-CM | POA: Diagnosis not present

## 2017-01-10 DIAGNOSIS — G301 Alzheimer's disease with late onset: Secondary | ICD-10-CM

## 2017-01-10 DIAGNOSIS — G9340 Encephalopathy, unspecified: Secondary | ICD-10-CM | POA: Diagnosis present

## 2017-01-10 DIAGNOSIS — F05 Delirium due to known physiological condition: Secondary | ICD-10-CM | POA: Diagnosis not present

## 2017-01-10 DIAGNOSIS — F0281 Dementia in other diseases classified elsewhere with behavioral disturbance: Secondary | ICD-10-CM | POA: Diagnosis not present

## 2017-01-10 DIAGNOSIS — Z8249 Family history of ischemic heart disease and other diseases of the circulatory system: Secondary | ICD-10-CM | POA: Insufficient documentation

## 2017-01-10 DIAGNOSIS — E785 Hyperlipidemia, unspecified: Secondary | ICD-10-CM | POA: Insufficient documentation

## 2017-01-10 DIAGNOSIS — Z66 Do not resuscitate: Secondary | ICD-10-CM | POA: Diagnosis not present

## 2017-01-10 DIAGNOSIS — Z79899 Other long term (current) drug therapy: Secondary | ICD-10-CM | POA: Diagnosis not present

## 2017-01-10 DIAGNOSIS — Z9889 Other specified postprocedural states: Secondary | ICD-10-CM | POA: Insufficient documentation

## 2017-01-10 DIAGNOSIS — B9620 Unspecified Escherichia coli [E. coli] as the cause of diseases classified elsewhere: Secondary | ICD-10-CM | POA: Insufficient documentation

## 2017-01-10 DIAGNOSIS — F0280 Dementia in other diseases classified elsewhere without behavioral disturbance: Secondary | ICD-10-CM | POA: Diagnosis not present

## 2017-01-10 DIAGNOSIS — K5909 Other constipation: Secondary | ICD-10-CM | POA: Diagnosis not present

## 2017-01-10 DIAGNOSIS — R252 Cramp and spasm: Secondary | ICD-10-CM | POA: Diagnosis not present

## 2017-01-10 DIAGNOSIS — R4182 Altered mental status, unspecified: Secondary | ICD-10-CM | POA: Diagnosis not present

## 2017-01-10 LAB — URINALYSIS, ROUTINE W REFLEX MICROSCOPIC
BILIRUBIN URINE: NEGATIVE
Glucose, UA: NEGATIVE mg/dL
KETONES UR: NEGATIVE mg/dL
NITRITE: POSITIVE — AB
PH: 5 (ref 5.0–8.0)
Protein, ur: NEGATIVE mg/dL
SPECIFIC GRAVITY, URINE: 1.018 (ref 1.005–1.030)

## 2017-01-10 LAB — HEPATIC FUNCTION PANEL
ALT: 25 U/L (ref 14–54)
AST: 20 U/L (ref 15–41)
Albumin: 2.9 g/dL — ABNORMAL LOW (ref 3.5–5.0)
Alkaline Phosphatase: 90 U/L (ref 38–126)
BILIRUBIN DIRECT: 0.1 mg/dL (ref 0.1–0.5)
BILIRUBIN TOTAL: 0.3 mg/dL (ref 0.3–1.2)
Indirect Bilirubin: 0.2 mg/dL — ABNORMAL LOW (ref 0.3–0.9)
Total Protein: 5.9 g/dL — ABNORMAL LOW (ref 6.5–8.1)

## 2017-01-10 LAB — AMMONIA: AMMONIA: 28 umol/L (ref 9–35)

## 2017-01-10 LAB — COMPREHENSIVE METABOLIC PANEL
ALT: 33 U/L (ref 14–54)
ANION GAP: 9 (ref 5–15)
AST: 26 U/L (ref 15–41)
Albumin: 3.5 g/dL (ref 3.5–5.0)
Alkaline Phosphatase: 110 U/L (ref 38–126)
BUN: 18 mg/dL (ref 6–20)
CALCIUM: 9.1 mg/dL (ref 8.9–10.3)
CHLORIDE: 104 mmol/L (ref 101–111)
CO2: 25 mmol/L (ref 22–32)
CREATININE: 0.76 mg/dL (ref 0.44–1.00)
Glucose, Bld: 153 mg/dL — ABNORMAL HIGH (ref 65–99)
Potassium: 4.1 mmol/L (ref 3.5–5.1)
SODIUM: 138 mmol/L (ref 135–145)
Total Bilirubin: 0.7 mg/dL (ref 0.3–1.2)
Total Protein: 7.3 g/dL (ref 6.5–8.1)

## 2017-01-10 LAB — CBC
HCT: 40.9 % (ref 36.0–46.0)
HEMOGLOBIN: 13.1 g/dL (ref 12.0–15.0)
MCH: 30.3 pg (ref 26.0–34.0)
MCHC: 32 g/dL (ref 30.0–36.0)
MCV: 94.5 fL (ref 78.0–100.0)
PLATELETS: 174 10*3/uL (ref 150–400)
RBC: 4.33 MIL/uL (ref 3.87–5.11)
RDW: 13.5 % (ref 11.5–15.5)
WBC: 6.7 10*3/uL (ref 4.0–10.5)

## 2017-01-10 LAB — VITAMIN B12: Vitamin B-12: 292 pg/mL (ref 180–914)

## 2017-01-10 LAB — FOLATE: FOLATE: 13.5 ng/mL (ref >5.9)

## 2017-01-10 LAB — TROPONIN I

## 2017-01-10 LAB — TSH: TSH: 6.043 u[IU]/mL — ABNORMAL HIGH (ref 0.350–4.500)

## 2017-01-10 MED ORDER — ACETAMINOPHEN 325 MG PO TABS: 650.0000 mg | ORAL_TABLET | Freq: Four times a day (QID) | ORAL | Status: DC | PRN

## 2017-01-10 MED ORDER — VORTIOXETINE HBR 10 MG PO TABS
10.0000 mg | ORAL_TABLET | Freq: Every day | ORAL | Status: DC
  Administered 2017-01-11: 10 mg via ORAL
  Filled 2017-01-10: qty 1

## 2017-01-10 MED ORDER — DEXTROSE 5 % IV SOLN: 1.0000 g | INTRAVENOUS | Status: DC

## 2017-01-10 MED ORDER — LORAZEPAM 1 MG PO TABS: 1.0000 mg | ORAL_TABLET | Freq: Three times a day (TID) | ORAL | Status: DC | PRN

## 2017-01-10 MED ORDER — SIMVASTATIN 20 MG PO TABS
20.0000 mg | ORAL_TABLET | Freq: Every day | ORAL | Status: DC
  Administered 2017-01-10: 20 mg via ORAL
  Filled 2017-01-10: qty 1

## 2017-01-10 MED ORDER — ACETAMINOPHEN 650 MG RE SUPP: 650.0000 mg | Freq: Four times a day (QID) | RECTAL | Status: DC | PRN

## 2017-01-10 MED ORDER — BISACODYL 10 MG RE SUPP: 10.0000 mg | Freq: Every day | RECTAL | Status: DC | PRN

## 2017-01-10 MED ORDER — HYDROCODONE-ACETAMINOPHEN 5-325 MG PO TABS: 1.0000 | ORAL_TABLET | ORAL | Status: DC | PRN

## 2017-01-10 MED ORDER — SODIUM CHLORIDE 0.9 % IV SOLN
INTRAVENOUS | Status: AC
  Administered 2017-01-10: 23:00:00 via INTRAVENOUS

## 2017-01-10 MED ORDER — POLYETHYLENE GLYCOL 3350 17 G PO PACK: 17.0000 g | PACK | Freq: Every day | ORAL | Status: DC | PRN

## 2017-01-10 MED ORDER — DEXTROSE 5 % IV SOLN
1.0000 g | Freq: Once | INTRAVENOUS | Status: AC
  Administered 2017-01-10: 1 g via INTRAVENOUS
  Filled 2017-01-10: qty 10

## 2017-01-10 MED ORDER — ASPIRIN 81 MG PO CHEW
81.0000 mg | CHEWABLE_TABLET | Freq: Every day | ORAL | Status: DC
  Administered 2017-01-11: 81 mg via ORAL
  Filled 2017-01-10: qty 1

## 2017-01-10 MED ORDER — SENNA 8.6 MG PO TABS
1.0000 | ORAL_TABLET | Freq: Two times a day (BID) | ORAL | Status: DC
  Administered 2017-01-10 – 2017-01-11 (×2): 8.6 mg via ORAL
  Filled 2017-01-10 (×2): qty 1

## 2017-01-10 MED ORDER — QUETIAPINE FUMARATE 25 MG PO TABS
50.0000 mg | ORAL_TABLET | Freq: Every day | ORAL | Status: DC
  Administered 2017-01-10: 50 mg via ORAL
  Filled 2017-01-10: qty 2

## 2017-01-10 MED ORDER — SODIUM CHLORIDE 0.9 % IV BOLUS (SEPSIS)
1000.0000 mL | Freq: Once | INTRAVENOUS | Status: AC
  Administered 2017-01-10: 1000 mL via INTRAVENOUS

## 2017-01-10 NOTE — Telephone Encounter (Signed)
Patient daughter, Mechele Claude called and stated that she wanted to let you know that patient was in the ER yesterday with showing signs of Jerking. Patient spent 5 hours in the ER, they tried to draw blood but was unsuccessful. The Dr. Alisa Graff that it was a TIA and patient was sent home. Daughter stated that last night before patient went to bed she had more jerking. She stated that the ER doctor told her that you could see the full report in the portal. Please Advise.

## 2017-01-10 NOTE — Telephone Encounter (Signed)
Appointment scheduled for today with Janett Billow for patient to be evaluated.

## 2017-01-10 NOTE — ED Notes (Signed)
EDP gave permission to draws labs from pt foot.

## 2017-01-10 NOTE — Patient Instructions (Signed)
Follow up with Dr Mariea Clonts after discharge

## 2017-01-10 NOTE — H&P (Addendum)
Charlotte Kelly ENI:[id number] DOB: January 07, 1930 DOA: 01/10/2017     PCP: Gayland Curry, DO   Outpatient Specialists: Neurology Patient coming from:   home Lives  With family    Chief Complaint: confusion and jerking motion  HPI: Charlotte Kelly is a 83 y.o. female with medical history significant of Alzheimer's, HTN depression and anxiety, constipation, PBA,  recurrent UTI.     Presented with worsening confusion and jerking movements she was evaluated for this yesterday is thought to be possible TIA and discharged to home she continued to have jerking movements. They last about 10 minutes or so. Patient continues T but less than usual have been more confused than usual. She's been drinking a bit of water. unAble to provide her own history secondary to dementia. In the office today she was noted to be a bit hypotensive  She has history of recurrent UTIs and takes Macrodantin for that on a daily basis Patient hasn't been endorsing nausea vomiting diarrhea or chest pain. Family reports urine has been foul-smelling I suspect UTI Regarding pertinent Chronic problems: She has Known significant dementia He has chronic constipation he takes LInzess  IN ER:  Temp (24hrs), Avg:96.6 F (35.9 C), Min:96.6 F (35.9 C), Max:96.6 F (35.9 C)      on arrival  ED Triage Vitals  Enc Vitals Group     BP 01/10/17 1245 116/64     Pulse Rate 01/10/17 1245 (!) 57     Resp 01/10/17 1245 16     Temp --      Temp src --      SpO2 01/10/17 1245 97 %     Weight 01/10/17 1247 117 lb (53.1 kg)     Height 01/10/17 1247 5' (1.524 m)     Head Circumference --      Peak Flow --      Pain Score --      Pain Loc --      Pain Edu? --      Excl. in [place]? --   RR 13 99% Hr 62 BP 146/70 Trop <0.03 Na 138 K 4.1 CR 0.76 Alb 3.5 WBC 6.7  CT head non acute Following Medications were ordered in ER: Medications  cefTRIAXone (ROCEPHIN) 1 g in dextrose 5 % 50 mL IVPB (not administered)  sodium chloride 0.9 % bolus  1,000 mL (0 mLs Intravenous Stopped 01/10/17 1653)     ER provider discussed case with:  Neurology recommends EGD and if positive start on Depakote  Hospitalist was called for admission for UTI and jerking motions likely myoclonic jerks  Review of Systems:    Pertinent positives include:  Fatigue, jerking motions, confusion, constipation   Constitutional:  No weight loss, night sweats, Fevers, chills, weight loss  HEENT:  No headaches, Difficulty swallowing,Tooth/dental problems,Sore throat,  No sneezing, itching, ear ache, nasal congestion, post nasal drip,  Cardio-vascular:  No chest pain, Orthopnea, PND, anasarca, dizziness, palpitations.no Bilateral lower extremity swelling  GI:  No heartburn, indigestion, abdominal pain, nausea, vomiting, diarrhea, change in bowel habits, loss of appetite, melena, blood in stool, hematemesis Resp:  no shortness of breath at rest. No dyspnea on exertion, No excess mucus, no productive cough, No non-productive cough, No coughing up of blood.No change in color of mucus.No wheezing. Skin:  no rash or lesions. No jaundice GU:  no dysuria, change in color of urine, no urgency or frequency. No straining to urinate.  No flank pain.  Musculoskeletal:  No joint pain or  no joint swelling. No decreased range of motion. No back pain.  Psych:  No change in mood or affect. No depression or anxiety. No memory loss.  Neuro: no localizing neurological complaints, no tingling, no weakness, no double vision, no gait abnormality, no slurred speech, no   As per HPI otherwise 10 point review of systems negative.   Past Medical History: Past Medical History:  Diagnosis Date  . Alzheimer disease   . Anxiety   . Arthritis   . Cataract   . Dementia in conditions classified elsewhere with behavioral disturbance   . Depression   . Encounter for screening fecal occult blood testing 09/19/2012  . Hypertension   . Insomnia   . Insomnia, unspecified   . Other  alteration of consciousness    Past Surgical History:  Procedure Laterality Date  . CHOLECYSTECTOMY    . gallblader    . SMALL INTESTINE SURGERY       Social History:  Ambulatory walker      reports that she has quit smoking. She has never used smokeless tobacco. She reports that she does not drink alcohol or use drugs.  Allergies:  No Known Allergies     Family History:   Family History  Problem Relation Age of Onset  . Heart failure Father     Medications: Prior to Admission medications   Medication Sig Start Date End Date Taking? Authorizing Provider  aspirin 81 MG tablet Take 81 mg by mouth daily.     [provider]  LINZESS 145 MCG CAPS capsule TAKE 1 CAPSULE(145 MCG) BY MOUTH DAILY 10/05/16   Reed, Tiffany L, DO  lisinopril (PRINIVIL,ZESTRIL) 10 MG tablet TAKE 1 TABLET(10 MG) BY MOUTH DAILY 12/28/16   Reed, Tiffany L, DO  LORazepam (ATIVAN) 1 MG tablet TAKE 1 TABLET BY MOUTH EVERY 8 HOURS AS NEEDED FOR ANXIETY 06/08/16   Lauree Chandler, NP  NAMZARIC 28-10 MG CP24 TAKE 1 CAPSULE BY MOUTH EVERY DAY FOR MEMORY 10/16/16   Reed, Tiffany L, DO  nitrofurantoin, macrocrystal-monohydrate, (MACROBID) 100 MG capsule TAKE ONE CAPSULE BY MOUTH EVERY DAY FOR UTI PREVENTION 10/19/16   Reed, Tiffany L, DO  NUEDEXTA 20-10 MG CAPS TAKE 1 CAPSULE BY MOUTH EVERY 12 HOURS 12/14/16   Reed, Tiffany L, DO  QUEtiapine (SEROQUEL) 25 MG tablet Take 25 mg by mouth. Take one tablet daily each morning and two tablets at bedtime for sleep and behavior    [provider]  simvastatin (ZOCOR) 20 MG tablet TAKE 1 TABLET BY MOUTH EVERY NIGHT AT BEDTIME 10/05/16   Reed, Tiffany L, DO  triamcinolone (KENALOG) 0.025 % ointment Apply topically daily as needed. Affected skin areas Patient not taking: Reported on 01/10/2017 07/10/16   Reed, Tiffany L, DO  TRINTELLIX 10 MG TABS TAKE 1 TABLET(10 MG) BY MOUTH DAILY 12/28/16   Gayland Curry, DO    Physical Exam: Patient Vitals for the past 24  hrs:  BP Pulse Resp SpO2 Height Weight  01/10/17 1715 (!) 142/67 62 13 100 % - -  01/10/17 1700 (!) 123/51 62 11 100 % - -  01/10/17 1645 112/69 61 14 100 % - -  01/10/17 1615 (!) 106/46 63 14 97 % - -  01/10/17 1530 127/63 61 12 100 % - -  01/10/17 1515 125/76 61 11 98 % - -  01/10/17 1500 (!) 123/57 (!) 57 15 98 % - -  01/10/17 1445 137/67 (!) 57 12 96 % - -  01/10/17 1430  126/67 (!) 58 13 99 % - -  01/10/17 1345 121/87 (!) 57 15 97 % - -  01/10/17 1330 121/68 (!) 58 16 99 % - -  01/10/17 1315 (!) 115/55 (!) 57 15 96 % - -  01/10/17 1300 - (!) 56 16 96 % - -  01/10/17 1247 - - - - 5' (1.524 m) 53.1 kg (117 lb)  01/10/17 1246 - - - 97 % - -  01/10/17 1245 116/64 (!) 57 16 97 % - -    1. General:  in No Acute distress 2. Psychological: Alert bu not Oriented 3. Head/ENT:    Dry Mucous Membranes                          Head Non traumatic, neck supple                            Poor Dentition 4. SKIN:   decreased Skin turgor,  Skin clean Dry and intact no rash 5. Heart: Regular rate and rhythm no  Murmur, Rub or gallop 6. Lungs: no wheezes or crackles   7. Abdomen: Soft,  non-tender, Non distended 8. Lower extremities: no clubbing, cyanosis, or edema 9. Neurologically  strength 5 out of 5 in all 4 extremities cranial nerves II through XII intact 10. MSK: Normal range of motion   body mass index is 22.85 kg/m.  Labs on Admission:   Labs on Admission: I have personally reviewed following labs and imaging studies  CBC:  Recent Labs Lab 01/10/17 1250  WBC 6.7  HGB 13.1  HCT 40.9  MCV 94.5  PLT 527   Basic Metabolic Panel:  Recent Labs Lab 01/10/17 1250  NA 138  K 4.1  CL 104  CO2 25  GLUCOSE 153*  BUN 18  CREATININE 0.76  CALCIUM 9.1   GFR: Estimated Creatinine Clearance: 36.9 mL/min (by C-G formula based on SCr of 0.76 mg/dL). Liver Function Tests:  Recent Labs Lab 01/10/17 1250  AST 26  ALT 33  ALKPHOS 110  BILITOT 0.7  PROT 7.3  ALBUMIN 3.5     No results for input(s): LIPASE, AMYLASE in the last 168 hours. No results for input(s): AMMONIA in the last 168 hours. Coagulation Profile: No results for input(s): INR, PROTIME in the last 168 hours. Cardiac Enzymes:  Recent Labs Lab 01/10/17 1352  TROPONINI <0.03   BNP (last 3 results) No results for input(s): PROBNP in the last 8760 hours. HbA1C: No results for input(s): HGBA1C in the last 72 hours. CBG: No results for input(s): GLUCAP in the last 168 hours. Lipid Profile: No results for input(s): CHOL, HDL, LDLCALC, TRIG, CHOLHDL, LDLDIRECT in the last 72 hours. Thyroid Function Tests: No results for input(s): TSH, T4TOTAL, FREET4, T3FREE, THYROIDAB in the last 72 hours. Anemia Panel: No results for input(s): VITAMINB12, FOLATE, FERRITIN, TIBC, IRON, RETICCTPCT in the last 72 hours. Urine analysis:    Component Value Date/Time   COLORURINE AMBER (A) 01/10/2017 1554   APPEARANCEUR CLOUDY (A) 01/10/2017 1554   APPEARANCEUR Clear 11/24/2013 1003   LABSPEC 1.018 01/10/2017 1554   PHURINE 5.0 01/10/2017 1554   GLUCOSEU NEGATIVE 01/10/2017 1554   HGBUR SMALL (A) 01/10/2017 1554   BILIRUBINUR NEGATIVE 01/10/2017 1554   BILIRUBINUR Negative 11/24/2013 1003   KETONESUR NEGATIVE 01/10/2017 1554   PROTEINUR NEGATIVE 01/10/2017 1554   UROBILINOGEN 0.2 08/10/2014 1449   NITRITE POSITIVE (A) 01/10/2017  [place] (A) 01/10/2017 1554   LEUKOCYTESUR 2+ (A) 11/24/2013 1003   Sepsis Labs: @LABRCNTIP (procalcitonin:4,lacticidven:4) )No results found for this or any previous visit (from the past 240 hour(s)).    UA  evidence of UTI    Lab Results  Component Value Date   HGBA1C 5.9 (H) 03/18/2013    Estimated Creatinine Clearance: 36.9 mL/min (by C-G formula based on SCr of 0.76 mg/dL).  BNP (last 3 results) No results for input(s): PROBNP in the last 8760 hours.   ECG REPORT  Independently reviewed Rate: 58  Rhythm: Junctional ST&T Change: No acute  ischemic changes  QTC 527  Filed Weights   01/10/17 1247  Weight: 53.1 kg (117 lb)     Cultures:    Component Value Date/Time   SDES URINE, CATHETERIZED 08/10/2014 [place] [id number] 08/10/2014 1449   CULT NO GROWTH Performed at [facility]  08/10/2014 1449   REPTSTATUS 08/12/2014 FINAL 08/10/2014 1449     Radiological Exams on Admission: Ct Head Wo Contrast  Result Date: 01/10/2017 CLINICAL DATA:  Altered mental status EXAM: CT HEAD WITHOUT CONTRAST TECHNIQUE: Contiguous axial images were obtained from the base of the skull through the vertex without intravenous contrast. COMPARISON:  Head CT 08/10/2014 FINDINGS: Brain: No mass lesion, intraparenchymal hemorrhage or extra-axial collection. No evidence of acute cortical infarct. Advanced atrophy. There is periventricular hypoattenuation compatible with chronic microvascular disease. Vascular: No hyperdense vessel or unexpected calcification. Skull: No skull fracture or other acute abnormality. Unchanged appearance of severe left temporomandibular joint osteoarthrosis. Sinuses/Orbits: No sinus fluid levels or advanced mucosal thickening. No mastoid effusion. Normal orbits. IMPRESSION: 1. Chronic microvascular disease and atrophy without acute intracranial abnormality. 2. Unchanged severe left TMJ osteoarthrosis. Electronically Signed   By: Ulyses Jarred M.D.   On: 01/10/2017 17:54    Chart has been reviewed    Assessment/Plan  83 y.o. female with medical history significant of Alzheimer's, HTN depression and anxiety, constipation, PBA,  recurrent UTI.  Admitted for UTI and jerking motions likely myoclonic jerks  Present on Admission: . Essential hypertension - Hold home medication given somewhat soft BP in ER . Constipation - Bowel regimen . Acute lower UTI - treat with rocephin await results of urine culture.  . Acute encephalopathy -   - most likely multifactorial secondary to combination of  Infection   mild dehydration secondary to decreased by mouth intake   - Will rehydrate   - treat underlining infection     - if no improvement may need further imaging to evaluate for CNS pathology  . Myoclonic jerking - EEG as per Neurology consult, clinically seems to be improving. IV rehydration and treatment for UTI . Dehydration - will rehydrate Dementia expect some degree of sundowning while hospitalized discussed with family patient has severe dementia currently appears to be at baseline Prolonged QT - - will monitor on tele avoid QT prolonging medications, rehydrate correct electrolytes  Other plan as per orders.  DVT prophylaxis:  SCD     Code Status:   DNR/DNI  as per  family   Family Communication:   Family  at  Bedside  plan of care was discussed with  Daughter, And MPOA  Disposition Plan:       To home once workup is complete and patient is stable                         Consults called: Neurology  Admission status:   obs   Level of care   tele            I have spent a total of 56 min on this admission   Harvy Riera 01/10/2017, 9:34 PM    [facility]  Pager [phone]   after 2 AM please page floor coverage PA If 7AM-7PM, please contact the day team taking care of the patient  [email or web address]  Password [email or web address]

## 2017-01-10 NOTE — ED Notes (Signed)
Neurology rounding at bedside

## 2017-01-10 NOTE — ED Provider Notes (Signed)
6:04 PM Patient will be admitted the hospital for ongoing workup.  Appreciate the assistance of neurology is recommending EEG.  Patient be admitted the hospital.  Urine concerning for urinary tract infection.  Urine culture sent.  IV Rocephin now.  Patient will be admitted to the hospital and followed along by neurology   Jola Schmidt, MD 01/10/17 [phone]

## 2017-01-10 NOTE — ED Triage Notes (Signed)
Pt in from doc's office via [facility] with decreased mental status and "shaking" since yesterday at 1245. Pt went to [facility] for eval and was discharged. Symptoms persisted and pt went to doc and practice called EMS. Hx of dementia, pt is nonverbal at baseline, does not follow commands. CBG 230, BP 110/68. No hx of seizures reported.

## 2017-01-10 NOTE — Progress Notes (Signed)
Patient ID: Charlotte Kelly, female   DOB: May 31, 1930, 83 y.o.   MRN: [id number]    Location:  [facility] Place of Service: OFFICE  CODE STATUS: DNR  Chief Complaint  Patient presents with  . Acute Visit    ED 01/09/17 for tremors. Seizure like activety, unable to open eyes. Here with daughter Mechele Claude and Care Jonelle Sports    HPI:  83 yo female seen today for ED f/u. Pt was taken to the ED yesterday for change in MS. Labs could not be drawn by ED. No imaging. She was told to f/u with dentist due to dental issues. She is a poor historian due to dementia; hx obtained from chart. She has increased lethargy, speech unintelligible, jerking limbs that can last >10 min. She has not been opening eyes or smiling like she usually does. She had a dental appt today but daughter cancelled it due to change in condition. She was able to eat some breakfast this AM and did drink some water with meal without being choked.  Pt is a poor historian due to dementia and unintelligible speech. Hx obtained from chart and daughter/caregiver   Alzheimer's dementia with behavior disturbance/PBA - mood stable on seroquel, trintellix. She takes nuedexta for PBA and namzaric for cognition. She rarely uses ativan. Her weight was down 10 lbs last month when she could stand on her own.  HTN - somewhat hypotensive today. Usually BP 120-140s/60-70s. She takes lisinopril daily. She is on ASA daily  Hyperlipidemia - takes simvastatin. LDL 55 in 09/2014  Recurrent UTIs - stable on macrodantin daily for prevention  Past Medical History:  Diagnosis Date  . Alzheimer disease   . Anxiety   . Arthritis   . Cataract   . Dementia in conditions classified elsewhere with behavioral disturbance   . Depression   . Encounter for screening fecal occult blood testing 09/19/2012  . Hypertension   . Insomnia   . Insomnia, unspecified   . Other alteration of consciousness     Past Surgical History:  Procedure Laterality Date  .  CHOLECYSTECTOMY    . gallblader    . SMALL INTESTINE SURGERY      Patient Care Team: Gayland Curry, DO as PCP - General (Geriatric Medicine)  Social History   Social History  . Marital status: Married    Spouse name: N/A  . Number of children: N/A  . Years of education: N/A   Occupational History  . Not on file.   Social History Main Topics  . Smoking status: Former Research scientist (life sciences)  . Smokeless tobacco: Never Used  . Alcohol use No  . Drug use: No  . Sexual activity: Not Currently   Other Topics Concern  . Not on file   Social History Narrative  . No narrative on file     reports that she has quit smoking. She has never used smokeless tobacco. She reports that she does not drink alcohol or use drugs.  Family History  Problem Relation Age of Onset  . Heart failure Father    Family Status  Relation Status  . Father Deceased  . Mother Deceased  . Daughter Alive  . Daughter Alive  . Daughter Alive     No Known Allergies  Medications: Patient's Medications  New Prescriptions   No medications on file  Previous Medications   ASPIRIN 81 MG TABLET    Take 81 mg by mouth daily.    LINZESS 145 MCG CAPS CAPSULE    TAKE 1  CAPSULE(145 MCG) BY MOUTH DAILY   LISINOPRIL (PRINIVIL,ZESTRIL) 10 MG TABLET    TAKE 1 TABLET(10 MG) BY MOUTH DAILY   LORAZEPAM (ATIVAN) 1 MG TABLET    TAKE 1 TABLET BY MOUTH EVERY 8 HOURS AS NEEDED FOR ANXIETY   NAMZARIC 28-10 MG CP24    TAKE 1 CAPSULE BY MOUTH EVERY DAY FOR MEMORY   NITROFURANTOIN, MACROCRYSTAL-MONOHYDRATE, (MACROBID) 100 MG CAPSULE    TAKE ONE CAPSULE BY MOUTH EVERY DAY FOR UTI PREVENTION   NUEDEXTA 20-10 MG CAPS    TAKE 1 CAPSULE BY MOUTH EVERY 12 HOURS   QUETIAPINE (SEROQUEL) 25 MG TABLET    Take 25 mg by mouth. Take one tablet daily each morning and two tablets at bedtime for sleep and behavior   SIMVASTATIN (ZOCOR) 20 MG TABLET    TAKE 1 TABLET BY MOUTH EVERY NIGHT AT BEDTIME   TRIAMCINOLONE (KENALOG) 0.025 % OINTMENT    Apply  topically daily as needed. Affected skin areas   TRINTELLIX 10 MG TABS    TAKE 1 TABLET(10 MG) BY MOUTH DAILY  Modified Medications   No medications on file  Discontinued Medications   QUETIAPINE (SEROQUEL) 25 MG TABLET    One tablet daily each morning and two tablets at bedtime for sleep and behavior    Review of Systems  Unable to perform ROS: Dementia (speech unintelligible)    Vitals:   01/10/17 1114  BP: (!) 98/58 repeat BP by myself 106/72  Pulse: 65  Temp: (!) 96.6 F (35.9 C)  TempSrc: Axillary  SpO2: 95%  Height: 5' (1.524 m)   There is no height or weight on file to calculate BMI. Pt unable to get out of w/c today.  Physical Exam  Constitutional: She appears well-developed.  Frail appearing in NAD, sitting in w/c. Speech unintelliglible  HENT:  Pt refuses to open mouth for exam  Eyes: Pupils are equal, round, and reactive to light. No scleral icterus.  Neck: Neck supple. Carotid bruit is not present. No tracheal deviation present.  Cardiovascular: Normal rate, regular rhythm and intact distal pulses.  Exam reveals no gallop and no friction rub.   Murmur (1/6 SEM) heard. No LE edema b/l. No calf TTP. No palpable cords  Pulmonary/Chest: Effort normal and breath sounds normal. No stridor. No respiratory distress. She has no wheezes. She has no rales.  Abdominal: Soft. Normal appearance and bowel sounds are normal. She exhibits no distension and no mass. There is no hepatomegaly. There is no tenderness. There is no rigidity, no rebound and no guarding. No hernia.  obese  Musculoskeletal: She exhibits edema.  Lymphadenopathy:    She has no cervical adenopathy.  Neurological: She is alert. She displays no atrophy and no tremor. She exhibits normal muscle tone. She displays no seizure activity.  Lethargic; no limb jerks noted today  Skin: Skin is warm and dry. No rash noted.  Tenting noted  Psychiatric: She has a normal mood and affect. She is agitated. Cognition and  memory are impaired.  Unintelligible speech     Labs reviewed: No visits with results within 3 Month(s) from this visit.  Latest known visit with results is:  Admission on 12/01/2015, Discharged on 12/01/2015  Component Date Value Ref Range Status  . Glucose-Capillary 12/01/2015 153* 65 - 99 mg/dL Final  . Sodium 12/01/2015 141  135 - 145 mmol/L Final  . Potassium 12/01/2015 4.3  3.5 - 5.1 mmol/L Final  . Chloride 12/01/2015 105  101 - 111 mmol/L Final  .  CO2 12/01/2015 26  22 - 32 mmol/L Final  . Glucose, Bld 12/01/2015 143* 65 - 99 mg/dL Final  . BUN 12/01/2015 17  6 - 20 mg/dL Final  . Creatinine, Ser 12/01/2015 0.80  0.44 - 1.00 mg/dL Final  . Calcium 12/01/2015 9.0  8.9 - 10.3 mg/dL Final  . GFR calc non Af Amer 12/01/2015 >60  >60 mL/min Final  . GFR calc Af Amer 12/01/2015 >60  >60 mL/min Final   Comment: (NOTE) The eGFR has been calculated using the CKD EPI equation. This calculation has not been validated in all clinical situations. eGFR's persistently <60 mL/min signify possible Chronic Kidney Disease.   . Anion gap 12/01/2015 10  5 - 15 Final  . WBC 12/01/2015 8.0  4.0 - 10.5 K/uL Final  . RBC 12/01/2015 4.24  3.87 - 5.11 MIL/uL Final  . Hemoglobin 12/01/2015 12.9  12.0 - 15.0 g/dL Final  . HCT 12/01/2015 39.8  36.0 - 46.0 % Final  . MCV 12/01/2015 93.9  78.0 - 100.0 fL Final  . MCH 12/01/2015 30.4  26.0 - 34.0 pg Final  . MCHC 12/01/2015 32.4  30.0 - 36.0 g/dL Final  . RDW 12/01/2015 13.7  11.5 - 15.5 % Final  . Platelets 12/01/2015 186  150 - 400 K/uL Final    No results found.   Assessment/Plan   ICD-10-CM   1. Hypotension, unspecified hypotension type I95.9    likely 2/2 dehydration - NEW; unstable  2. Dehydration - NEW E86.0   3. Jerking movements of extremities - NEW R25.2   4. Transient alteration of awareness R40.4    r/o CVA vs TIA - NEW  5. Pseudobulbar affect F48.2   6. Late onset Alzheimer's disease with behavioral disturbance G30.1    F02.81     EMS contacted via 911 and pt to be transported to ED for further eval/mx, r/o CVA  F/u with Dr Mariea Clonts upon d/c   [facility]. Perlie Gold  [facility] [address] [place], [place] [address] [phone] Cell (Monday-Friday 8 AM - 5 PM) [phone] After 5 PM and follow prompts

## 2017-01-10 NOTE — Consult Note (Signed)
NEURO HOSPITALIST CONSULT NOTE   Requestig physician: Dr. Kathrynn Humble   Reason for Consult: Myoclonus   History obtained from:  Family  HPI:                                                                                                                                          Charlotte Kelly is an 83 y.o. female who was recently brought to the emergency department last night. This was due to patient having staring spells and inactivity with intermittent what they described as myoclonic activity which is brief jerking of upper extremities and lower extremities but this is not sustained nor is it rhythmic. There is also note of some intermittent abnormal sucking-like motions with her mouth. No labs were drawn at that time. Patient was brought back to the emergency department today secondary to the same reasons. CMP showed no gross abnormalities, CBC was within normal limits, urinalysis and urine culture currently are pending. Per family the urine was cloudy and foul smelling.  Patient has severe Alzheimer's and does not interact or talk. Currently there is no abnormal movements or jerking noted. The patient is grinding her teeth however daughter and caretaker see that is normal for her. She does not show any asterixis. She does track my movements with her eyes.  Past Medical History:  Diagnosis Date  . Alzheimer disease   . Anxiety   . Arthritis   . Cataract   . Dementia in conditions classified elsewhere with behavioral disturbance   . Depression   . Encounter for screening fecal occult blood testing 09/19/2012  . Hypertension   . Insomnia   . Insomnia, unspecified   . Other alteration of consciousness     Past Surgical History:  Procedure Laterality Date  . CHOLECYSTECTOMY    . gallblader    . SMALL INTESTINE SURGERY      Family History  Problem Relation Age of Onset  . Heart failure Father       Social History:  reports that she has quit smoking. She has  never used smokeless tobacco. She reports that she does not drink alcohol or use drugs.  No Known Allergies  MEDICATIONS:  No current facility-administered medications for this encounter.    Current Outpatient Prescriptions  Medication Sig Dispense Refill  . aspirin 81 MG tablet Take 81 mg by mouth daily.     Marland Kitchen LINZESS 145 MCG CAPS capsule TAKE 1 CAPSULE(145 MCG) BY MOUTH DAILY 90 capsule 1  . lisinopril (PRINIVIL,ZESTRIL) 10 MG tablet TAKE 1 TABLET(10 MG) BY MOUTH DAILY 90 tablet 1  . LORazepam (ATIVAN) 1 MG tablet TAKE 1 TABLET BY MOUTH EVERY 8 HOURS AS NEEDED FOR ANXIETY 90 tablet 0  . NAMZARIC 28-10 MG CP24 TAKE 1 CAPSULE BY MOUTH EVERY DAY FOR MEMORY 90 capsule 0  . nitrofurantoin, macrocrystal-monohydrate, (MACROBID) 100 MG capsule TAKE ONE CAPSULE BY MOUTH EVERY DAY FOR UTI PREVENTION 30 capsule 5  . NUEDEXTA 20-10 MG CAPS TAKE 1 CAPSULE BY MOUTH EVERY 12 HOURS 180 capsule 2  . QUEtiapine (SEROQUEL) 25 MG tablet Take 25 mg by mouth. Take one tablet daily each morning and two tablets at bedtime for sleep and behavior    . simvastatin (ZOCOR) 20 MG tablet TAKE 1 TABLET BY MOUTH EVERY NIGHT AT BEDTIME 90 tablet 1  . triamcinolone (KENALOG) 0.025 % ointment Apply topically daily as needed. Affected skin areas (Patient not taking: Reported on 01/10/2017) 30 g 5  . TRINTELLIX 10 MG TABS TAKE 1 TABLET(10 MG) BY MOUTH DAILY 90 tablet 1      ROS:                                                                                                                                       History obtained from unobtainable from patient due to Severe dementia  General ROS: negative for - chills, fatigue, fever, night sweats, weight gain or weight loss Psychological ROS: negative for - behavioral disorder, hallucinations, memory difficulties, mood swings or suicidal ideation Ophthalmic ROS:  negative for - blurry vision, double vision, eye pain or loss of vision ENT ROS: negative for - epistaxis, nasal discharge, oral lesions, sore throat, tinnitus or vertigo Allergy and Immunology ROS: negative for - hives or itchy/watery eyes Hematological and Lymphatic ROS: negative for - bleeding problems, bruising or swollen lymph nodes Endocrine ROS: negative for - galactorrhea, hair pattern changes, polydipsia/polyuria or temperature intolerance Respiratory ROS: negative for - cough, hemoptysis, shortness of breath or wheezing Cardiovascular ROS: negative for - chest pain, dyspnea on exertion, edema or irregular heartbeat Gastrointestinal ROS: negative for - abdominal pain, diarrhea, hematemesis, nausea/vomiting or stool incontinence Genito-Urinary ROS: negative for - dysuria, hematuria, incontinence or urinary frequency/urgency Musculoskeletal ROS: negative for - joint swelling or muscular weakness Neurological ROS: as noted in HPI Dermatological ROS: negative for rash and skin lesion changes   Blood pressure (!) 123/51, pulse 62, resp. rate 11, height 5' (1.524 m), weight 53.1 kg (117 lb), SpO2 100 %.   Neurologic Examination:  HEENT-  Normocephalic, no lesions, without obvious abnormality.  Normal external eye and conjunctiva.  Normal TM's bilaterally.  Normal auditory canals and external ears. Normal external nose, mucus membranes and septum.  Normal pharynx. Cardiovascular- S1, S2 normal, pulses palpable throughout   Lungs- chest clear, no wheezing, rales, normal symmetric air entry, Heart exam - S1, S2 normal, no murmur, no gallop, rate regular Abdomen- normal findings: bowel sounds normal Extremities- no edema Lymph-no adenopathy palpable Musculoskeletal-no joint tenderness, deformity or swelling Skin-warm and dry, no hyperpigmentation, vitiligo, or suspicious lesions  Neurological  Examination Mental Status: She was awake and tracks my movements in the room. She will follow very simple commands such as holding her arms up but this is only intermittent. I did not note any asterixis when she did so. Per daughter she is nonverbal. She cannot answer any mental questions and this is her baseline. Cranial Nerves: II: Visual fields grossly normal-blinks to threat bilaterally III,IV, VI: ptosis not present, extra-ocular motions intact bilaterally pupils equal, round, reactive to light and accommodation V,VII: Face symmetric, facial light touch sensation normal bilaterally VIII: hearing normal bilaterally IX,X: uvula rises symmetrically XI: bilateral shoulder shrug XII: midline tongue extension Motor: Patient moves all extremities antigravity her left upper extremity and lower extremity have increased tone compared to her right however this is more of her fighting the and my actions than it is tone. Sensory: Withdraws to pain in all extremities Deep Tendon Reflexes: 2+ and symmetric throughout upper extremities and knee jerk Plantars: Right: downgoing   Left: downgoing Cerebellar: Could not take part in exam Gait: Not tested      Lab Results: Basic Metabolic Panel:  Recent Labs Lab 01/10/17 1250  NA 138  K 4.1  CL 104  CO2 25  GLUCOSE 153*  BUN 18  CREATININE 0.76  CALCIUM 9.1    Liver Function Tests:  Recent Labs Lab 01/10/17 1250  AST 26  ALT 33  ALKPHOS 110  BILITOT 0.7  PROT 7.3  ALBUMIN 3.5   No results for input(s): LIPASE, AMYLASE in the last 168 hours. No results for input(s): AMMONIA in the last 168 hours.  CBC:  Recent Labs Lab 01/10/17 1250  WBC 6.7  HGB 13.1  HCT 40.9  MCV 94.5  PLT 174    Cardiac Enzymes:  Recent Labs Lab 01/10/17 1352  TROPONINI <0.03    Lipid Panel: No results for input(s): CHOL, TRIG, HDL, CHOLHDL, VLDL, LDLCALC in the last 168 hours.  CBG: No results for input(s): GLUCAP in the last 168  hours.  Microbiology: Results for orders placed or performed during the hospital encounter of 08/10/14  Urine culture     Status: None   Collection Time: 08/10/14  2:49 PM  Result Value Ref Range Status   Specimen Description URINE, CATHETERIZED  Final   Special Requests ADDED [id number]  Final   Colony Count NO GROWTH Performed at [facility]   Final   Culture NO GROWTH Performed at [facility]   Final   Report Status 08/12/2014 FINAL  Final    Coagulation Studies: No results for input(s): LABPROT, INR in the last 72 hours.  Imaging: No results found.     Assessment and plan per attending neurologist  Etta Quill PA-C [facility] [phone]  01/10/2017, 5:29 PM   Assessment/Plan: This is a 83 year old female with end-stage dementia and around-the-clock care. Apparently over the last few day she's been having some staring spells in addition to intermittent jerking  of arms bilaterally and/or legs. These are described as single jerks that often can occur when she is falling asleep or sitting in the chair. Patient does not appear to be septic or significantly ill however urine that was obtained was described as cloudy and foul-smelling. Awaiting urine culture and urinalysis. Given her significant dementia cannot rule out seizures as oftentimes staring spells and abnormal mouth movements can present as temporal lobe seizures. Although I think seizure is less likely. This also could be secondary to toxic metabolic issues if her urinalysis does come back positive for urinary tract infection.  Recommend: # TSH, ammonia, folate, LFTs and metabolic wor, # Agree with urinalysis and urine culture #  EEG to evaluate for seizures, low suspicion patient is in subclinical status  NEUROHOSPITALIST ADDENDUM I have seen and examined the patient on 01/11/17. This is a 83 year old female with advanced dementia who presents increased somnolence and associated  staring spells and  brief jerking of her body. She was subsequently found to have a urinary tract infection for which she received antibiotics. Clinically she is doing much better today and according to her family much closer baseline. She is interactive, and can be directed to follow some commands.  Examination She appears pleasant and not in distress. While she is nonverbal, she is interactive and tries to follow commands when direct. She can raise both her arms and legs on on passive movement and can sustain it. She tracks with her eyes and follows my finger in all directions. She has prominent frontal release such as root reflex and palmomental reflex glabellar tap and grasp reflex. I noticed what to do brief myoclonic jerks during my 15 minutes with the patient.  Assessment The patient likely had metabolic encephalopathy secondary due to to sepsis. Her EEG showed moderate generalized slowing. No epileptiform discharges are seen and is unlikely the patient presentation was due to seizures. She had no steroypical movements to suggest a complex partial seizures. I do not want to start her on antiepileptics at this time and recommend treatment of her underlying infection. She appears back to baseline and can be discharged home. Of note, Myoclonic jerks can be seen in dementia patients with CJD, however the timeline of her dementia was over 7 years and EEG findings do not favor this diagnosis.  Impression Advanced dementia with metabolic encephalopathy  Remell Giaimo MD [facility] [phone]  If 7pm to 7am, please call on call as listed on [facility].

## 2017-01-10 NOTE — ED Provider Notes (Signed)
[facility] DEPT Provider Note   CSN: [id number] Arrival date & time: 01/10/17  1240     History   Chief Complaint Chief Complaint  Patient presents with  . Altered Mental Status  . Shaking    HPI Charlotte Kelly is a 83 y.o. female.  LEVEL 5 CAVEAT FOR MENTAL STATUS CHANGES. HPI Pt with hx of dementia and HTN comes in with cc of :shaking" and mental status changes. Pt was sent to the ER by the PCP.  Per family pt has been having change in mental status over the past few days. At baseline pt is interactive, but now more somnolent.   Pt also has been having some myoclonic and tonic / clonic jerks over the past few days.  Pt hasn't had any recent falls and no n/v/f/c/diarrhea. Pt is on prophylactic dose of macrobid. No new meds.  Past Medical History:  Diagnosis Date  . Alzheimer disease   . Anxiety   . Arthritis   . Cataract   . Dementia in conditions classified elsewhere with behavioral disturbance   . Depression   . Encounter for screening fecal occult blood testing 09/19/2012  . Hypertension   . Insomnia   . Insomnia, unspecified   . Other alteration of consciousness     Patient Active Problem List   Diagnosis Date Noted  . Major depressive disorder with single episode, in partial remission ([place]) 11/13/2016  . Weight loss 11/13/2016  . Essential hypertension 10/07/2015  . Folliculitis of nose 35/57/3220  . Xerosis of skin 02/10/2014  . Constipation 12/04/2013  . Pseudobulbar affect 09/04/2013  . Late onset Alzheimer's disease with behavioral disturbance 09/04/2013  . Stye external 09/04/2013  . Hyperlipidemia 09/19/2012  . Recurrent UTI 09/19/2012  . Anxiety   . Insomnia     Past Surgical History:  Procedure Laterality Date  . CHOLECYSTECTOMY    . gallblader    . SMALL INTESTINE SURGERY      OB History    No data available       Home Medications    Prior to Admission medications   Medication Sig Start Date End Date Taking? Authorizing  Provider  aspirin 81 MG tablet Take 81 mg by mouth daily.     [provider]  LINZESS 145 MCG CAPS capsule TAKE 1 CAPSULE(145 MCG) BY MOUTH DAILY 10/05/16   Reed, Tiffany L, DO  lisinopril (PRINIVIL,ZESTRIL) 10 MG tablet TAKE 1 TABLET(10 MG) BY MOUTH DAILY 12/28/16   Reed, Tiffany L, DO  LORazepam (ATIVAN) 1 MG tablet TAKE 1 TABLET BY MOUTH EVERY 8 HOURS AS NEEDED FOR ANXIETY 06/08/16   Lauree Chandler, NP  NAMZARIC 28-10 MG CP24 TAKE 1 CAPSULE BY MOUTH EVERY DAY FOR MEMORY 10/16/16   Reed, Tiffany L, DO  nitrofurantoin, macrocrystal-monohydrate, (MACROBID) 100 MG capsule TAKE ONE CAPSULE BY MOUTH EVERY DAY FOR UTI PREVENTION 10/19/16   Reed, Tiffany L, DO  NUEDEXTA 20-10 MG CAPS TAKE 1 CAPSULE BY MOUTH EVERY 12 HOURS 12/14/16   Reed, Tiffany L, DO  QUEtiapine (SEROQUEL) 25 MG tablet Take 25 mg by mouth. Take one tablet daily each morning and two tablets at bedtime for sleep and behavior    [provider]  simvastatin (ZOCOR) 20 MG tablet TAKE 1 TABLET BY MOUTH EVERY NIGHT AT BEDTIME 10/05/16   Reed, Tiffany L, DO  triamcinolone (KENALOG) 0.025 % ointment Apply topically daily as needed. Affected skin areas Patient not taking: Reported on 01/10/2017 07/10/16   Hollace Kinnier L, DO  TRINTELLIX 10 MG TABS TAKE 1 TABLET(10 MG) BY MOUTH DAILY 12/28/16   Gayland Curry, DO    Family History Family History  Problem Relation Age of Onset  . Heart failure Father     Social History Social History  Substance Use Topics  . Smoking status: Former Research scientist (life sciences)  . Smokeless tobacco: Never Used  . Alcohol use No     Allergies   Patient has no known allergies.   Review of Systems Review of Systems  Unable to perform ROS: Mental status change     Physical Exam Updated Vital Signs BP (!) 123/51   Pulse 62   Resp 11   Ht 5' (1.524 m)   Wt 53.1 kg (117 lb)   SpO2 100%   BMI 22.85 kg/m   Physical Exam  Constitutional: She appears well-developed.  HENT:  Head: Normocephalic and  atraumatic.  Eyes: EOM are normal.  Neck: Normal range of motion. Neck supple.  Cardiovascular: Normal rate.   Pulmonary/Chest: Effort normal.  Abdominal: Soft. Bowel sounds are normal.  Neurological: She is alert.  Intermittent LUE jerking noted by me - myoclonic jerk. No tonic clonic movement  Skin: Skin is warm and dry.  Nursing note and vitals reviewed.    ED Treatments / Results  Labs (all labs ordered are listed, but only abnormal results are displayed) Labs Reviewed  COMPREHENSIVE METABOLIC PANEL - Abnormal; Notable for the following:       Result Value   Glucose, Bld 153 (*)    All other components within normal limits  URINE CULTURE  CBC  TROPONIN I  URINALYSIS, ROUTINE W REFLEX MICROSCOPIC  TSH  VITAMIN B12  FOLATE  RPR  HEPATIC FUNCTION PANEL  AMMONIA  CBG MONITORING, ED    EKG  EKG Interpretation  Date/Time:  Wednesday January 10 2017 12:43:29 EDT Ventricular Rate:  58 PR Interval:    QRS Duration: 98 QT Interval:  536 QTC Calculation: 527 R Axis:   -56 Text Interpretation:  Junctional rhythm Left anterior fascicular block Low voltage, precordial leads Consider anterior infarct Prolonged QT interval No acute changes Nonspecific T wave abnormality Confirmed by Varney Biles [phone]) on 01/10/2017 1:42:06 PM       Radiology No results found.  Procedures Procedures (including critical care time)  Medications Ordered in ED Medications  sodium chloride 0.9 % bolus 1,000 mL (0 mLs Intravenous Stopped 01/10/17 1653)     Initial Impression / Assessment and Plan / ED Course  I have reviewed the triage vital signs and the nursing notes.  Pertinent labs & imaging results that were available during my care of the patient were reviewed by me and considered in my medical decision making (see chart for details).    Pt comes in with cc of change in mentation.  DDx includes: ICH / Stroke ACS Seizure Infection - UTI/Pneumonia Encephalopathy    Electrolyte abnormality Drug overdose DKA Metabolic disorders including thyroid disorders, adrenal insufficiency Cancer of unknown origin / paraneoplastic process Seizure  Pt comes in with some intermittent myoclonic activity. Seems like at home there has been bilateral upper and lower extremity jerking - intermittent. Pt is on Seroquel. We dont think she is having serotonin syndrome.  - Check for UTI. - Call neuro for the jerking episodes. - No concerns for stroke.  Change in mentation likely delirium vs. worsening dementia     Final Clinical Impressions(s) / ED Diagnoses   Final diagnoses:  Transient alteration of awareness  Late onset Alzheimer's  disease without behavioral disturbance  Myoclonic jerking    New Prescriptions New Prescriptions   No medications on file     Varney Biles, MD 01/10/17 1726

## 2017-01-11 ENCOUNTER — Observation Stay (HOSPITAL_COMMUNITY): Payer: Medicare Other

## 2017-01-11 ENCOUNTER — Telehealth: Payer: Self-pay

## 2017-01-11 DIAGNOSIS — N390 Urinary tract infection, site not specified: Secondary | ICD-10-CM | POA: Diagnosis not present

## 2017-01-11 DIAGNOSIS — D696 Thrombocytopenia, unspecified: Secondary | ICD-10-CM

## 2017-01-11 DIAGNOSIS — R9431 Abnormal electrocardiogram [ECG] [EKG]: Secondary | ICD-10-CM | POA: Diagnosis not present

## 2017-01-11 DIAGNOSIS — N3000 Acute cystitis without hematuria: Secondary | ICD-10-CM | POA: Diagnosis not present

## 2017-01-11 DIAGNOSIS — G9340 Encephalopathy, unspecified: Secondary | ICD-10-CM | POA: Diagnosis not present

## 2017-01-11 DIAGNOSIS — F0280 Dementia in other diseases classified elsewhere without behavioral disturbance: Secondary | ICD-10-CM

## 2017-01-11 DIAGNOSIS — I10 Essential (primary) hypertension: Secondary | ICD-10-CM | POA: Diagnosis not present

## 2017-01-11 DIAGNOSIS — R404 Transient alteration of awareness: Secondary | ICD-10-CM | POA: Diagnosis not present

## 2017-01-11 DIAGNOSIS — K5909 Other constipation: Secondary | ICD-10-CM | POA: Diagnosis not present

## 2017-01-11 DIAGNOSIS — Z87440 Personal history of urinary (tract) infections: Secondary | ICD-10-CM | POA: Diagnosis not present

## 2017-01-11 DIAGNOSIS — B9620 Unspecified Escherichia coli [E. coli] as the cause of diseases classified elsewhere: Secondary | ICD-10-CM

## 2017-01-11 DIAGNOSIS — E860 Dehydration: Secondary | ICD-10-CM | POA: Diagnosis not present

## 2017-01-11 DIAGNOSIS — G253 Myoclonus: Secondary | ICD-10-CM | POA: Diagnosis not present

## 2017-01-11 DIAGNOSIS — K5900 Constipation, unspecified: Secondary | ICD-10-CM | POA: Diagnosis not present

## 2017-01-11 DIAGNOSIS — G301 Alzheimer's disease with late onset: Secondary | ICD-10-CM | POA: Diagnosis not present

## 2017-01-11 DIAGNOSIS — I4581 Long QT syndrome: Secondary | ICD-10-CM | POA: Diagnosis not present

## 2017-01-11 DIAGNOSIS — G9341 Metabolic encephalopathy: Secondary | ICD-10-CM | POA: Diagnosis not present

## 2017-01-11 DIAGNOSIS — F0281 Dementia in other diseases classified elsewhere with behavioral disturbance: Secondary | ICD-10-CM | POA: Diagnosis not present

## 2017-01-11 LAB — CBC
HCT: 33.1 % — ABNORMAL LOW (ref 36.0–46.0)
HEMOGLOBIN: 10.7 g/dL — AB (ref 12.0–15.0)
MCH: 30.2 pg (ref 26.0–34.0)
MCHC: 32.3 g/dL (ref 30.0–36.0)
MCV: 93.5 fL (ref 78.0–100.0)
Platelets: 128 10*3/uL — ABNORMAL LOW (ref 150–400)
RBC: 3.54 MIL/uL — ABNORMAL LOW (ref 3.87–5.11)
RDW: 13.3 % (ref 11.5–15.5)
WBC: 5.7 10*3/uL (ref 4.0–10.5)

## 2017-01-11 LAB — COMPREHENSIVE METABOLIC PANEL
ALT: 20 U/L (ref 14–54)
ANION GAP: 6 (ref 5–15)
AST: 22 U/L (ref 15–41)
Albumin: 2.7 g/dL — ABNORMAL LOW (ref 3.5–5.0)
Alkaline Phosphatase: 77 U/L (ref 38–126)
BILIRUBIN TOTAL: 0.5 mg/dL (ref 0.3–1.2)
BUN: 12 mg/dL (ref 6–20)
CALCIUM: 8.2 mg/dL — AB (ref 8.9–10.3)
CO2: 23 mmol/L (ref 22–32)
Chloride: 113 mmol/L — ABNORMAL HIGH (ref 101–111)
Creatinine, Ser: 0.7 mg/dL (ref 0.44–1.00)
GFR calc Af Amer: >60 mL/min (ref >60)
GLUCOSE: 104 mg/dL — AB (ref 65–99)
Potassium: 4.2 mmol/L (ref 3.5–5.1)
SODIUM: 142 mmol/L (ref 135–145)
TOTAL PROTEIN: 5.6 g/dL — AB (ref 6.5–8.1)

## 2017-01-11 LAB — T4, FREE: FREE T4: 0.8 ng/dL (ref 0.61–1.12)

## 2017-01-11 LAB — RPR: RPR: NONREACTIVE

## 2017-01-11 LAB — PHOSPHORUS: PHOSPHORUS: 4.3 mg/dL (ref 2.5–4.6)

## 2017-01-11 LAB — MAGNESIUM: Magnesium: 1.9 mg/dL (ref 1.7–2.4)

## 2017-01-11 MED ORDER — SENNA 8.6 MG PO TABS: 1.0000 | ORAL_TABLET | Freq: Two times a day (BID) | ORAL | 0 refills | Status: AC

## 2017-01-11 MED ORDER — FOSFOMYCIN TROMETHAMINE 3 G PO PACK
3.0000 g | PACK | Freq: Once | ORAL | Status: AC
  Administered 2017-01-11: 3 g via ORAL
  Filled 2017-01-11: qty 3

## 2017-01-11 MED ORDER — BISACODYL 10 MG RE SUPP
10.0000 mg | Freq: Every day | RECTAL | 0 refills | Status: AC | PRN
Start: 2017-01-11 — End: ?

## 2017-01-11 MED ORDER — POLYETHYLENE GLYCOL 3350 17 G PO PACK: 17.0000 g | PACK | Freq: Every day | ORAL | 0 refills | Status: AC | PRN

## 2017-01-11 NOTE — Progress Notes (Signed)
EEG Completed; Results Pending  

## 2017-01-11 NOTE — Procedures (Signed)
ELECTROENCEPHALOGRAM REPORT  Date of Study: 01/11/2017  Patient's Name: Charlotte Kelly MRN: [id number] Date of Birth: [birth year]  Referring Provider: Dr. Toy Baker  Clinical History: This is an 83 year old woman with staring spells and inactivity with brief jerking.  Medications: acetaminophen (TYLENOL) tablet 650 mg  aspirin chewable tablet 81 mg  bisacodyl (DULCOLAX) suppository 10 mg  cefTRIAXone (ROCEPHIN) 1 g in dextrose 5 % 50 mL IVPB  HYDROcodone-acetaminophen (NORCO/VICODIN) 5-325 MG per tablet 1-2 tablet  LORazepam (ATIVAN) tablet 1 mg  polyethylene glycol (MIRALAX / GLYCOLAX) packet 17 g  QUEtiapine (SEROQUEL) tablet 50 mg  senna (SENOKOT) tablet 8.6 mg  simvastatin (ZOCOR) tablet 20 mg  vortioxetine HBr (TRINTELLIX) TABS 10 mg   Technical Summary: A multichannel digital EEG recording measured by the international 10-20 system with electrodes applied with paste and impedances below 5000 ohms performed as portable with EKG monitoring in an awake and asleep patient.  Hyperventilation and photic stimulation were not performed.  The digital EEG was referentially recorded, reformatted, and digitally filtered in a variety of bipolar and referential montages for optimal display.   Description: The patient is awake and asleep during the recording. She does not follow commands. The EEG is obscured by muscle artifact majority of the time. In between artifact, there is no clear posterior dominant rhythm seen. The background consists of a large amount of diffuse 4-5 Hz theta and 2-3 Hz delta slowing.  During drowsiness and sleep, there is an increase in theta and delta slowing of the background with rare vertex waves seen.  Hyperventilation and photic stimulation were not performed.  There were no epileptiform discharges or electrographic seizures seen in between artifact.  Technical difficulties with EKG lead.  Impression: This limited awake and asleep EEG is abnormal due to  moderate diffuse slowing of the background.  Clinical Correlation of the above findings indicates diffuse cerebral dysfunction that is non-specific in etiology and can be seen with hypoxic/ischemic injury, toxic/metabolic encephalopathies, neurodegenerative disorders, or medication effect. The study is limited due to significant muscle artifact obscuring majority of EEG. The absence of epileptiform discharges does not rule out a clinical diagnosis of epilepsy.  Clinical correlation is advised.   Ellouise Newer, M.D.

## 2017-01-11 NOTE — Discharge Summary (Signed)
Physician Discharge Summary  Charlotte Kelly CXK:[id number] DOB: 08-01-1929 DOA: 01/10/2017  PCP: Gayland Curry, DO  Admit date: 01/10/2017 Discharge date: 01/11/2017  Admitted From: Home Disposition:  Home  Recommendations for Outpatient Follow-up:  1. Follow up with PCP in 1-2 weeks; Appointment scheduled for 3:30 PM on 01/15/17 2. Please obtain CMP/CBC, Mag, Phos and repeat EKG in one week 3. Repeat TSH and Free T4 in 4-6 Weeks 4. Please follow up on the following pending results: Urine Cx Results as showed >100,000 of E Coli. Please Adjust Abx as appropriately   Home Health: No Equipment/Devices: None   Discharge Condition: Stable CODE STATUS: FULL CODE Diet recommendation: Heart Healthy Diet  Brief/Interim Summary: Charlotte Kelly is a 83 y.o. female with medical history significant of Alzheimer's, HTN depression and anxiety, constipation, PBA,  recurrent UTI who is no verbal at baseline who presented with worsening confusion and jerking movements she was evaluated for on 7/31 that was thought to be possible TIA and discharged to home; however she continued to have jerking movements. Per family they last about 10 minutes or so. Family aslo was concern that she more confused than usual. She's been drinking a bit of water. The patient is unable to provide her own history secondary to dementia.  She has history of recurrent UTIs and takes Macrodantin for that on a daily basis. Family reported foul smelling Urine. She was admitted for her Myoclonic jerking and UTI. Neurology evaluated and did an EEG and recommended treating Infectious causes as that was likely contributing to her jerking. Patient grew >100,000 CFU of E Coli but family did not want to stay another night to receive results so she was given Fosfomycin prior to D/C. Patient will follow up with PCP on Monday 01/15/17 for hospital follow up and get the results of the sensitivities of the Urine Cx and have Abx adjusted as necessary. She was  deemed medically stable to D/C Home after Neurology had no other recommendations.   Discharge Diagnoses:  Active Problems:   Late onset Alzheimer's disease with behavioral disturbance   Constipation   Essential hypertension   Acute lower UTI   Acute encephalopathy   Myoclonic jerking   Dehydration   Prolonged QT interval  Myoclonic Jerking in the setting of Infection from UTI -Neurology Consulted and feel this is stemming from Infection -EEG done and was non-specific -TSH was done and was slightly elevated but Free T4 wnl; Repeat TSH and Free T4 in 4-6 weeks when patient is treated for UTI -Per Neuro Treat UTI and symptoms should resolve; Recommended no Neuro Follow Up  Acute E Coli UTI -Urinalysis suspcious for UTI -Urine Cx Grew >100,000 CFU's of E Coli -Was empirically started on IV Ceftriaxone and then given Fosfomycin as patient's family did not want to stay another night in the hospital just for sensitivities -Defer to PCP to follow up on Cx Sensitiviites and adjust Abx as necessary as patient takes prophylactic Macrobid daily -Follow up with PCP scheduled for 01/15/17 at 3:30  Constipation -Continue Bowel Regimen and Linzess  Acute Encephalopathy superimposed on Baseline Dementia -Patient is essentially nonverbal and has severe dementia at base line -Head CT showed Chronic microvascular disease and atrophy without acute intracranial abnormality and Unchanged severe left TMJ osteoarthrosis. -Improved after IVF rehydration and Treating Underlying Infection -Follow up with PCP  -Delirium Precautions -C/w Home Seroquel and Namzaric along with Trintellix for Depression  Essential HTN -Home BP Meds were held in ER because BP's were soft -Ok  to resume at D/C as BP improved  Prolonged QT -EKG showed Junctional Rhythm and prolonged QT of 527 on Admission -Repeat EKG showed Junctional Rhythm with QTc of 487 -Was On telemetry -Avoid QT prolonging medications -Rehydrated and  Electrolytes corrected -Repeat EKG at PCP office on 01/15/17  Thrombocytopenia -Platelet Count went from 174 -> 128 -Likely dilutional Drop vs. Infection -Repeat CBC at PCP office  Dehydration -Rehydrated with IVF and improved.   Discharge Instructions  Discharge Instructions    Call MD for:  difficulty breathing, headache or visual disturbances    Complete by:  As directed    Call MD for:  extreme fatigue    Complete by:  As directed    Call MD for:  hives    Complete by:  As directed    Call MD for:  persistant dizziness or light-headedness    Complete by:  As directed    Call MD for:  persistant nausea and vomiting    Complete by:  As directed    Call MD for:  redness, tenderness, or signs of infection (pain, swelling, redness, odor or green/yellow discharge around incision site)    Complete by:  As directed    Call MD for:  severe uncontrolled pain    Complete by:  As directed    Call MD for:  temperature >100.4    Complete by:  As directed    Diet - low sodium heart healthy    Complete by:  As directed    Discharge instructions    Complete by:  As directed    Follow up with PCP within 1 week and Take all medications as prescribed. Patient had a UTI and Urine Cx are still pending but was given IV Antibiotics and Fosfomycin for Treatment. Please have PCP follow up on Urine Cx and adjust antibiotics as necessary and repeat EKG, and Bloodwork Monday. If symptoms change or worsen please return to the ED for evaluation.   Increase activity slowly    Complete by:  As directed      Allergies as of 01/11/2017   No Known Allergies     Medication List    TAKE these medications   aspirin 81 MG tablet Take 81 mg by mouth daily.   bisacodyl 10 MG suppository Commonly known as:  DULCOLAX Place 1 suppository (10 mg total) rectally daily as needed for moderate constipation.   LINZESS 145 MCG Caps capsule Generic drug:  linaclotide TAKE 1 CAPSULE(145 MCG) BY MOUTH DAILY    lisinopril 10 MG tablet Commonly known as:  PRINIVIL,ZESTRIL TAKE 1 TABLET(10 MG) BY MOUTH DAILY   LORazepam 1 MG tablet Commonly known as:  ATIVAN TAKE 1 TABLET BY MOUTH EVERY 8 HOURS AS NEEDED FOR ANXIETY   NAMZARIC 28-10 MG Cp24 Generic drug:  Memantine HCl-Donepezil HCl TAKE 1 CAPSULE BY MOUTH EVERY DAY FOR MEMORY   nitrofurantoin (macrocrystal-monohydrate) 100 MG capsule Commonly known as:  MACROBID TAKE ONE CAPSULE BY MOUTH EVERY DAY FOR UTI PREVENTION   NUEDEXTA 20-10 MG Caps Generic drug:  Dextromethorphan-Quinidine TAKE 1 CAPSULE BY MOUTH EVERY 12 HOURS   polyethylene glycol packet Commonly known as:  MIRALAX / GLYCOLAX Take 17 g by mouth daily as needed for mild constipation.   QUEtiapine 25 MG tablet Commonly known as:  SEROQUEL Take 25 mg by mouth. Take one tablet daily each morning and two tablets at bedtime for sleep and behavior   senna 8.6 MG Tabs tablet Commonly known as:  SENOKOT Take 1  tablet (8.6 mg total) by mouth 2 (two) times daily.   simvastatin 20 MG tablet Commonly known as:  ZOCOR TAKE 1 TABLET BY MOUTH EVERY NIGHT AT BEDTIME   triamcinolone 0.025 % ointment Commonly known as:  KENALOG Apply topically daily as needed. Affected skin areas   TRINTELLIX 10 MG Tabs Generic drug:  vortioxetine HBr TAKE 1 TABLET(10 MG) BY MOUTH DAILY       No Known Allergies  Consultations:  Neurology  Procedures/Studies: Ct Head Wo Contrast  Result Date: 01/10/2017 CLINICAL DATA:  Altered mental status EXAM: CT HEAD WITHOUT CONTRAST TECHNIQUE: Contiguous axial images were obtained from the base of the skull through the vertex without intravenous contrast. COMPARISON:  Head CT 08/10/2014 FINDINGS: Brain: No mass lesion, intraparenchymal hemorrhage or extra-axial collection. No evidence of acute cortical infarct. Advanced atrophy. There is periventricular hypoattenuation compatible with chronic microvascular disease. Vascular: No hyperdense vessel or  unexpected calcification. Skull: No skull fracture or other acute abnormality. Unchanged appearance of severe left temporomandibular joint osteoarthrosis. Sinuses/Orbits: No sinus fluid levels or advanced mucosal thickening. No mastoid effusion. Normal orbits. IMPRESSION: 1. Chronic microvascular disease and atrophy without acute intracranial abnormality. 2. Unchanged severe left TMJ osteoarthrosis. Electronically Signed   By: Ulyses Jarred M.D.   On: 01/10/2017 17:54    EEG Impression: This limited awake and asleep EEG is abnormal due to moderate diffuse slowing of the background.  Clinical Correlation of the above findings indicates diffuse cerebral dysfunction that is non-specific in etiology and can be seen with hypoxic/ischemic injury, toxic/metabolic encephalopathies, neurodegenerative disorders, or medication effect. The study is limited due to significant muscle artifact obscuring majority of EEG. The absence of epileptiform discharges does not rule out a clinical diagnosis of epilepsy.  Clinical correlation is advised.  Subjective: Seen and and examined and was non-verbal. Per daughter patient seems to be improved. No N/V. No CP or SOB. Daughter wanting to take her mother home today and did not want to stay in the hospital for Urine Cx Results.   Discharge Exam: Vitals:   01/11/17 0214 01/11/17 0601  BP: (!) 127/52 (!) 142/58  Pulse: 64 (!) 58  Resp: 16 20  Temp: 97.6 F (36.4 C) 98.1 F (36.7 C)   Vitals:   01/10/17 2203 01/10/17 2244 01/11/17 0214 01/11/17 0601  BP: (!) 113/97 (!) 125/46 (!) 127/52 (!) 142/58  Pulse: (!) 59 (!) 58 64 (!) 58  Resp: (!) 9 16 16 20   Temp:  98.2 F (36.8 C) 97.6 F (36.4 C) 98.1 F (36.7 C)  SpO2: 98% 96% 98% 97%  Weight:      Height:       General: Pt is awake, not in acute distress Cardiovascular: RRR, S1/S2 +, no rubs, no gallops Respiratory: Diminished bilaterally, no wheezing, no rhonchi Abdominal: Soft, NT, ND, bowel sounds  + Extremities: no edema, no cyanosis  The results of significant diagnostics from this hospitalization (including imaging, microbiology, ancillary and laboratory) are listed below for reference.    Microbiology: No results found for this or any previous visit (from the past 240 hour(s)).   Labs: BNP (last 3 results) No results for input(s): BNP in the last 8760 hours. Basic Metabolic Panel:  Recent Labs Lab 01/10/17 1250 01/11/17 0645  NA 138 142  K 4.1 4.2  CL 104 113*  CO2 25 23  GLUCOSE 153* 104*  BUN 18 12  CREATININE 0.76 0.70  CALCIUM 9.1 8.2*  MG  --  1.9  PHOS  --  4.3   Liver Function Tests:  Recent Labs Lab 01/10/17 1250 01/10/17 2131 01/11/17 0645  AST 26 20 22   ALT 33 25 20  ALKPHOS 110 90 77  BILITOT 0.7 0.3 0.5  PROT 7.3 5.9* 5.6*  ALBUMIN 3.5 2.9* 2.7*   No results for input(s): LIPASE, AMYLASE in the last 168 hours.  Recent Labs Lab 01/10/17 1700  AMMONIA 28   CBC:  Recent Labs Lab 01/10/17 1250 01/11/17 0645  WBC 6.7 5.7  HGB 13.1 10.7*  HCT 40.9 33.1*  MCV 94.5 93.5  PLT 174 128*   Cardiac Enzymes:  Recent Labs Lab 01/10/17 1352  TROPONINI <0.03   BNP: Invalid input(s): POCBNP CBG: No results for input(s): GLUCAP in the last 168 hours. D-Dimer No results for input(s): DDIMER in the last 72 hours. Hgb A1c No results for input(s): HGBA1C in the last 72 hours. Lipid Profile No results for input(s): CHOL, HDL, LDLCALC, TRIG, CHOLHDL, LDLDIRECT in the last 72 hours. Thyroid function studies  Recent Labs  01/10/17 2131  TSH 6.043*   Anemia work up  Recent Labs  01/10/17 2131  VITAMINB12 292  FOLATE 13.5   Urinalysis    Component Value Date/Time   COLORURINE AMBER (A) 01/10/2017 1554   APPEARANCEUR CLOUDY (A) 01/10/2017 1554   APPEARANCEUR Clear 11/24/2013 1003   LABSPEC 1.018 01/10/2017 1554   PHURINE 5.0 01/10/2017 1554   GLUCOSEU NEGATIVE 01/10/2017 1554   HGBUR SMALL (A) 01/10/2017 1554   BILIRUBINUR  NEGATIVE 01/10/2017 1554   BILIRUBINUR Negative 11/24/2013 1003   KETONESUR NEGATIVE 01/10/2017 1554   PROTEINUR NEGATIVE 01/10/2017 1554   UROBILINOGEN 0.2 08/10/2014 1449   NITRITE POSITIVE (A) 01/10/2017 1554   LEUKOCYTESUR MODERATE (A) 01/10/2017 1554   LEUKOCYTESUR 2+ (A) 11/24/2013 1003   Sepsis Labs Invalid input(s): PROCALCITONIN,  WBC,  LACTICIDVEN Microbiology No results found for this or any previous visit (from the past 240 hour(s)).  Time coordinating discharge: 35 minutes  SIGNED:  Kerney Elbe, DO [facility] 01/11/2017, 1:10 PM Pager [phone]  If 7PM-7AM, please contact night-coverage [email or web address] Password [email or web address]

## 2017-01-11 NOTE — Telephone Encounter (Signed)
Patient is currently in the hospital with an intended discharge date of today. Patient had a urine sample collected and sent for culture. Patient will receive antibiotic from hospitalitis.  Patient's caregiver was informed to call PCP to schedule follow-up for Monday (scheduled) and to inform Hollace Kinnier L, DO that she will need to confirm that patient is on the appropriate antibiotic when culture sensitivities result.

## 2017-01-12 ENCOUNTER — Telehealth: Payer: Self-pay

## 2017-01-12 LAB — URINE CULTURE: Culture: >=100000 — AB

## 2017-01-12 NOTE — Telephone Encounter (Signed)
I called pt left message on machine to have someone return my call, that message is on the result note.

## 2017-01-12 NOTE — Telephone Encounter (Signed)
I sent a message earlier with her urine result asking about her antibiotic preference--liquid or pills?

## 2017-01-12 NOTE — Telephone Encounter (Signed)
LMTCB to complete TCM. FYI! - MM

## 2017-01-15 ENCOUNTER — Encounter: Payer: Self-pay | Admitting: Internal Medicine

## 2017-01-15 ENCOUNTER — Ambulatory Visit (INDEPENDENT_AMBULATORY_CARE_PROVIDER_SITE_OTHER): Payer: Medicare Other | Admitting: Internal Medicine

## 2017-01-15 VITALS — BP 118/78 | HR 60 | Temp 97.6°F

## 2017-01-15 DIAGNOSIS — N3000 Acute cystitis without hematuria: Secondary | ICD-10-CM

## 2017-01-15 DIAGNOSIS — R32 Unspecified urinary incontinence: Secondary | ICD-10-CM | POA: Diagnosis not present

## 2017-01-15 DIAGNOSIS — R159 Full incontinence of feces: Secondary | ICD-10-CM

## 2017-01-15 DIAGNOSIS — R410 Disorientation, unspecified: Secondary | ICD-10-CM | POA: Diagnosis not present

## 2017-01-15 MED ORDER — TRIMETHOPRIM 100 MG PO TABS: 100.0000 mg | ORAL_TABLET | Freq: Two times a day (BID) | ORAL | 3 refills | Status: AC

## 2017-01-15 MED ORDER — AMOXICILLIN 500 MG PO TABS: 500.0000 mg | ORAL_TABLET | Freq: Two times a day (BID) | ORAL | 0 refills | Status: DC

## 2017-01-15 NOTE — Progress Notes (Signed)
Location:  [facility] clinic Provider:  Jakaden Ouzts L. Mariea Clonts, D.O., C.M.D.  Code Status: DNR Goals of Care:  Advanced Directives 01/15/2017  Does Patient Have a Medical Advance Directive? Yes  Type of Advance Directive Living will  Does patient want to make changes to medical advance directive? -  Copy of [place] in Chart? -  Pre-existing out of facility DNR order (yellow form or pink MOST form) -   Chief Complaint  Patient presents with  . Hospitalization Follow-up    follow-up    HPI: Patient is a 83 y.o. female seen today for hospital f/u for seizure-like activity. She was to [facility] on 7/31 with these episodes. She had shaking of her extremities, but was awake and alert.  This the recurred on 8/1 here at [facility] and Dr. Eulas Post sent her out to the hospital.  She was seen at [facility] and underwent an EEG which showed generalized slowing.   She was diagnosed with a UTI.  She had been on macrodantin as preventive.  After some IVFs, labs were drawn and unremarkable.  She had IV rocephin on 8/1 and then fosfomycin liquid at the hospital before discharge.  A note was sent to me about following up the urine culture. Results returned Friday, but pt's caregiver did not return our call when we reached out.    No more spells since coming home Wednesday.  She's been her usual self since.  Her appetite is no different.  Her caregiver asked where her infection came from with E Coli--we reviewed that this is normal bowel flora and talked about front to back wiping, good hygiene. She mentioned that pt is alone in bed overnight and incontinent then at times.  She gets intermittent red areas on her bottom and they do use barrier cream in the mornings and after incontinent episodes.    Past Medical History:  Diagnosis Date  . Alzheimer disease   . Anxiety   . Arthritis   . Cataract   . Dementia in conditions classified elsewhere with behavioral disturbance   . Depression   . Encounter for  screening fecal occult blood testing 09/19/2012  . Hypertension   . Insomnia   . Insomnia, unspecified   . Other alteration of consciousness     Past Surgical History:  Procedure Laterality Date  . CHOLECYSTECTOMY    . gallblader    . SMALL INTESTINE SURGERY      No Known Allergies  Allergies as of 01/15/2017   No Known Allergies     Medication List       Accurate as of 01/15/17  3:54 PM. Always use your most recent med list.          aspirin 81 MG tablet Take 81 mg by mouth daily.   bisacodyl 10 MG suppository Commonly known as:  DULCOLAX Place 1 suppository (10 mg total) rectally daily as needed for moderate constipation.   LINZESS 145 MCG Caps capsule Generic drug:  linaclotide TAKE 1 CAPSULE(145 MCG) BY MOUTH DAILY   lisinopril 10 MG tablet Commonly known as:  PRINIVIL,ZESTRIL TAKE 1 TABLET(10 MG) BY MOUTH DAILY   LORazepam 1 MG tablet Commonly known as:  ATIVAN TAKE 1 TABLET BY MOUTH EVERY 8 HOURS AS NEEDED FOR ANXIETY   NAMZARIC 28-10 MG Cp24 Generic drug:  Memantine HCl-Donepezil HCl TAKE 1 CAPSULE BY MOUTH EVERY DAY FOR MEMORY   NUEDEXTA 20-10 MG Caps Generic drug:  Dextromethorphan-Quinidine TAKE 1 CAPSULE BY MOUTH EVERY 12 HOURS  polyethylene glycol packet Commonly known as:  MIRALAX / GLYCOLAX Take 17 g by mouth daily as needed for mild constipation.   QUEtiapine 25 MG tablet Commonly known as:  SEROQUEL Take 25 mg by mouth. Take one tablet daily each morning and two tablets at bedtime for sleep and behavior   senna 8.6 MG Tabs tablet Commonly known as:  SENOKOT Take 1 tablet (8.6 mg total) by mouth 2 (two) times daily.   simvastatin 20 MG tablet Commonly known as:  ZOCOR TAKE 1 TABLET BY MOUTH EVERY NIGHT AT BEDTIME   TRINTELLIX 10 MG Tabs Generic drug:  vortioxetine HBr TAKE 1 TABLET(10 MG) BY MOUTH DAILY       Review of Systems:  Review of Systems  Constitutional: Negative for chills, fever and malaise/fatigue.  Respiratory:  Negative for shortness of breath.   Cardiovascular: Negative for chest pain and palpitations.  Gastrointestinal: Negative for abdominal pain.  Genitourinary: Negative for dysuria, frequency and urgency.       Incontinent  Musculoskeletal: Negative for falls.  Neurological: Negative for seizures, loss of consciousness and weakness.       No further seizure-like activity  Endo/Heme/Allergies: Bruises/bleeds easily.  Psychiatric/Behavioral: Positive for memory loss.    Health Maintenance  Topic Date Due  . PNA vac Low Risk Adult (2 of 2 - PPSV23) 10/06/2016  . INFLUENZA VACCINE  01/10/2017  . DEXA SCAN  06/12/2018 (Originally 10/22/1996)  . TETANUS/TDAP  08/09/2024    Physical Exam: Vitals:   01/15/17 1551  BP: 118/78  Pulse: 60  Temp: 97.6 F (36.4 C)  TempSrc: Oral  SpO2: 97%   There is no height or weight on file to calculate BMI. Physical Exam  Constitutional: No distress.  Cardiovascular: Normal rate, regular rhythm and normal heart sounds.   Pulmonary/Chest: Effort normal and breath sounds normal.  Abdominal: Soft. Bowel sounds are normal. She exhibits no distension. There is no tenderness.  Musculoskeletal: Normal range of motion.  Comes in wheelchair  Neurological: She is alert.  Skin: Skin is warm and dry. Capillary refill takes less than 2 seconds.  Psychiatric: She has a normal mood and affect.    Labs reviewed: Basic Metabolic Panel:  Recent Labs  01/10/17 1250 01/10/17 2131 01/11/17 0645  NA 138  --  142  K 4.1  --  4.2  CL 104  --  113*  CO2 25  --  23  GLUCOSE 153*  --  104*  BUN 18  --  12  CREATININE 0.76  --  0.70  CALCIUM 9.1  --  8.2*  MG  --   --  1.9  PHOS  --   --  4.3  TSH  --  6.043*  --    Liver Function Tests:  Recent Labs  01/10/17 1250 01/10/17 2131 01/11/17 0645  AST 26 20 22   ALT 33 25 20  ALKPHOS 110 90 77  BILITOT 0.7 0.3 0.5  PROT 7.3 5.9* 5.6*  ALBUMIN 3.5 2.9* 2.7*   No results for input(s): LIPASE, AMYLASE  in the last 8760 hours.  Recent Labs  01/10/17 1700  AMMONIA 28   CBC:  Recent Labs  01/10/17 1250 01/11/17 0645  WBC 6.7 5.7  HGB 13.1 10.7*  HCT 40.9 33.1*  MCV 94.5 93.5  PLT 174 128*   Lipid Panel: No results for input(s): CHOL, HDL, LDLCALC, TRIG, CHOLHDL, LDLDIRECT in the last 8760 hours. Lab Results  Component Value Date   HGBA1C 5.9 (H) 03/18/2013  Procedures since last visit: Ct Head Wo Contrast  Result Date: 01/10/2017 CLINICAL DATA:  Altered mental status EXAM: CT HEAD WITHOUT CONTRAST TECHNIQUE: Contiguous axial images were obtained from the base of the skull through the vertex without intravenous contrast. COMPARISON:  Head CT 08/10/2014 FINDINGS: Brain: No mass lesion, intraparenchymal hemorrhage or extra-axial collection. No evidence of acute cortical infarct. Advanced atrophy. There is periventricular hypoattenuation compatible with chronic microvascular disease. Vascular: No hyperdense vessel or unexpected calcification. Skull: No skull fracture or other acute abnormality. Unchanged appearance of severe left temporomandibular joint osteoarthrosis. Sinuses/Orbits: No sinus fluid levels or advanced mucosal thickening. No mastoid effusion. Normal orbits. IMPRESSION: 1. Chronic microvascular disease and atrophy without acute intracranial abnormality. 2. Unchanged severe left TMJ osteoarthrosis. Electronically Signed   By: Ulyses Jarred M.D.   On: 01/10/2017 17:54    Assessment/Plan 1. Acute cystitis without hematuria - will put her on amoxil now short term for 7 days which can be crushed in pudding or other tasty snack - amoxicillin (AMOXIL) 500 MG tablet; Take 1 tablet (500 mg total) by mouth 2 (two) times daily.  Dispense: 14 tablet; Refill: 0 -hydrate, hydrate, hydrate -good hygiene and frequent changing to prevent infections and skin breakdown  2. Delirium -felt to be cause of seizure like activity, treated for UTI, was also quite dry as first visit to ED  labs could not even be drawn so could really be dehydration not infection - finish abx out in case with amoxicillin -since ecoli was resistant to her prophylaxis, will d/c macrodantin and start her on trimethoprim prophylaxis per caregiver request when she is done with the amoxil (previously macrodantin "worked well" for a long time) -hydrate, hydrate, hydrate  3. Bowel and bladder incontinence -ongoing, see above hygiene counseling -also use endit cream if reddened stage 1 pressure areas develop from being alone in bed overnight (husband frail and not able to do this type of care and caregivers there all day)  Labs/tests ordered:  No new Next appt:  05/21/2017--keep as scheduled   Waymond Meador L. Leiyah Maultsby, D.O. [facility] [address], [place] [address] Cell Phone (Mon-Fri 8am-5pm):  [phone] On Call:  [phone] & follow prompts after 5pm & weekends Office Phone:  [phone] Office Fax:  [phone]

## 2017-01-15 NOTE — Patient Instructions (Signed)
Endit cream is good for red areas on the bottom.

## 2017-01-15 NOTE — Telephone Encounter (Signed)
See result note.  

## 2017-01-15 NOTE — Telephone Encounter (Signed)
Please call them again if she has not been changed to another antibiotic by now.

## 2017-01-20 ENCOUNTER — Other Ambulatory Visit: Payer: Self-pay | Admitting: Internal Medicine

## 2017-01-26 ENCOUNTER — Telehealth: Payer: Self-pay | Admitting: *Deleted

## 2017-01-26 DIAGNOSIS — E860 Dehydration: Secondary | ICD-10-CM

## 2017-01-26 NOTE — Telephone Encounter (Signed)
Adrienne, caregiver called and stated that she had some concerns. Stated patient is not the same since she started the antibiotic that the hospital prescribed. She is sleeping a lot and hardly awake enough to even take medications or eat. No fever. Stable vitals. Caregiver just notices a lot of sleeping. Doesn't want her to get dehydrated. Taking Trimpex 100mg  twice daily. Caregiver noticed these symptoms when patient started taking this medication. Please Advise.

## 2017-01-27 NOTE — Telephone Encounter (Signed)
Let's have her come in on Monday for a bmp.  Let's stop the trimpex and monitor.  This may be a kidney effect from the antibiotic or it could be progression of her dementia and frailty.

## 2017-01-29 NOTE — Telephone Encounter (Signed)
LMOM to return call.

## 2017-01-31 NOTE — Telephone Encounter (Signed)
Patient caregiver notified. Stopped the Trimpex. Caregiver stated they can't bring her in till Friday morning. Instructed her to bring her in before 11:00am. Agreed. Lab appointment scheduled. Order placed.

## 2017-02-02 ENCOUNTER — Other Ambulatory Visit: Payer: Medicare Other

## 2017-02-02 DIAGNOSIS — E860 Dehydration: Secondary | ICD-10-CM

## 2017-02-03 LAB — BASIC METABOLIC PANEL
BUN: 21 mg/dL (ref 7–25)
CO2: 16 mmol/L — ABNORMAL LOW (ref 20–32)
Calcium: 9.1 mg/dL (ref 8.6–10.4)
Chloride: 105 mmol/L (ref 98–110)
Creat: 0.86 mg/dL (ref 0.60–0.88)
Glucose, Bld: 161 mg/dL — ABNORMAL HIGH (ref 65–99)
Potassium: 4.9 mmol/L (ref 3.5–5.3)
Sodium: 139 mmol/L (ref 135–146)

## 2017-02-05 ENCOUNTER — Encounter: Payer: Self-pay | Admitting: *Deleted

## 2017-02-19 ENCOUNTER — Other Ambulatory Visit: Payer: Self-pay

## 2017-02-19 ENCOUNTER — Other Ambulatory Visit: Payer: Self-pay | Admitting: Internal Medicine

## 2017-02-19 MED ORDER — QUETIAPINE FUMARATE 25 MG PO TABS: ORAL_TABLET | ORAL | 1 refills | Status: AC

## 2017-02-19 NOTE — Telephone Encounter (Signed)
Medication refill was requested by pharmacy for quetiapine 25 mg. Rx sent to pharmacy electronically.

## 2017-03-19 ENCOUNTER — Other Ambulatory Visit: Payer: Self-pay | Admitting: Internal Medicine

## 2017-03-21 ENCOUNTER — Ambulatory Visit (INDEPENDENT_AMBULATORY_CARE_PROVIDER_SITE_OTHER): Payer: Medicare Other | Admitting: Nurse Practitioner

## 2017-03-21 ENCOUNTER — Encounter: Payer: Self-pay | Admitting: Nurse Practitioner

## 2017-03-21 VITALS — BP 124/76 | HR 63 | Temp 98.2°F | Resp 17

## 2017-03-21 DIAGNOSIS — F0281 Dementia in other diseases classified elsewhere with behavioral disturbance: Secondary | ICD-10-CM | POA: Diagnosis not present

## 2017-03-21 DIAGNOSIS — F482 Pseudobulbar affect: Secondary | ICD-10-CM | POA: Diagnosis not present

## 2017-03-21 DIAGNOSIS — Z111 Encounter for screening for respiratory tuberculosis: Secondary | ICD-10-CM

## 2017-03-21 DIAGNOSIS — Z23 Encounter for immunization: Secondary | ICD-10-CM | POA: Diagnosis not present

## 2017-03-21 DIAGNOSIS — R159 Full incontinence of feces: Secondary | ICD-10-CM | POA: Diagnosis not present

## 2017-03-21 DIAGNOSIS — R32 Unspecified urinary incontinence: Secondary | ICD-10-CM

## 2017-03-21 DIAGNOSIS — I10 Essential (primary) hypertension: Secondary | ICD-10-CM | POA: Diagnosis not present

## 2017-03-21 DIAGNOSIS — G301 Alzheimer's disease with late onset: Secondary | ICD-10-CM

## 2017-03-21 DIAGNOSIS — F329 Major depressive disorder, single episode, unspecified: Secondary | ICD-10-CM

## 2017-03-21 DIAGNOSIS — F419 Anxiety disorder, unspecified: Secondary | ICD-10-CM

## 2017-03-21 DIAGNOSIS — F32A Depression, unspecified: Secondary | ICD-10-CM

## 2017-03-21 DIAGNOSIS — F02818 Dementia in other diseases classified elsewhere, unspecified severity, with other behavioral disturbance: Secondary | ICD-10-CM

## 2017-03-21 NOTE — Progress Notes (Signed)
Careteam: Patient Care Team: Gayland Curry, DO as PCP - General (Geriatric Medicine)  Advanced Directive information Does Patient Have a Medical Advance Directive?: Yes, Type of Advance Directive: Living will  No Known Allergies  Chief Complaint  Patient presents with  . Acute Visit    Pt is being seen to complete FL2 and admission forms for assisted living.      HPI: Patient is a 83 y.o. female seen in the office today for assisted living.  Husband leaving the picture and she will need 24/7 hour supervision therefore she is moving to assistive living. They have done an evaluation on her and she will be moving [facility]. Caregiver feels like she needs skilled care but daughter wanted her at [facility] Was previously looking into SNF  Caregiver with her today. Reports she is total care. Needs assistance with transfers and mobility. Will walk but needs assistance with this. Uses wheelchair.   Combative to caregivers most of the time with care.   Called daughter and spoke with her regarding the move. She has been in consultation with Dr Renee Pain and she recommended [facility] memory unit which is were they will be going. She is a "level 5" for level of care which is the highest level and they are able to attend to her needs. Her caregivers will be with her during the transition and they will adjust the needs based on care there.   Review of Systems:  Review of Systems  Unable to perform ROS: Dementia    Past Medical History:  Diagnosis Date  . Alzheimer disease   . Anxiety   . Arthritis   . Cataract   . Dementia in conditions classified elsewhere with behavioral disturbance   . Depression   . Encounter for screening fecal occult blood testing 09/19/2012  . Hypertension   . Insomnia   . Insomnia, unspecified   . Other alteration of consciousness    Past Surgical History:  Procedure Laterality Date  . CHOLECYSTECTOMY    . gallblader    .  SMALL INTESTINE SURGERY     Social History:   reports that she has quit smoking. She has never used smokeless tobacco. She reports that she does not drink alcohol or use drugs.  Family History  Problem Relation Age of Onset  . Heart failure Father     Medications: Patient's Medications  New Prescriptions   No medications on file  Previous Medications   ASPIRIN 81 MG TABLET    Take 81 mg by mouth daily.    BISACODYL (DULCOLAX) 10 MG SUPPOSITORY    Place 1 suppository (10 mg total) rectally daily as needed for moderate constipation.   LINZESS 145 MCG CAPS CAPSULE    TAKE 1 CAPSULE(145 MCG) BY MOUTH DAILY   LISINOPRIL (PRINIVIL,ZESTRIL) 10 MG TABLET    TAKE 1 TABLET(10 MG) BY MOUTH DAILY   LORAZEPAM (ATIVAN) 1 MG TABLET    TAKE 1 TABLET BY MOUTH EVERY 8 HOURS AS NEEDED FOR ANXIETY   NAMZARIC 28-10 MG CP24    TAKE 1 CAPSULE BY MOUTH EVERY DAY FOR MEMORY   NUEDEXTA 20-10 MG CAPS    TAKE 1 CAPSULE BY MOUTH EVERY 12 HOURS   POLYETHYLENE GLYCOL (MIRALAX / GLYCOLAX) PACKET    Take 17 g by mouth daily as needed for mild constipation.   QUETIAPINE (SEROQUEL) 25 MG TABLET    Take one tablet daily each morning and two tablets at bedtime for sleep  and behavior   SENNA (SENOKOT) 8.6 MG TABS TABLET    Take 1 tablet (8.6 mg total) by mouth 2 (two) times daily.   SIMVASTATIN (ZOCOR) 20 MG TABLET    TAKE 1 TABLET BY MOUTH EVERY NIGHT AT BEDTIME   TRIMETHOPRIM (TRIMPEX) 100 MG TABLET    Take 1 tablet (100 mg total) by mouth 2 (two) times daily.   TRINTELLIX 10 MG TABS    TAKE 1 TABLET(10 MG) BY MOUTH DAILY  Modified Medications   No medications on file  Discontinued Medications   AMOXICILLIN (AMOXIL) 500 MG TABLET    Take 1 tablet (500 mg total) by mouth 2 (two) times daily.     Physical Exam:  Vitals:   03/21/17 1050  BP: 124/76  Pulse: 63  Resp: 17  Temp: 98.2 F (36.8 C)  TempSrc: Oral  SpO2: 96%   There is no height or weight on file to calculate BMI.  Physical Exam  Constitutional:  No distress.  Frail elderly female  HENT:  Nose: Nose normal.  Mouth/Throat: No oropharyngeal exudate.  Neck: Neck supple.  Cardiovascular: Normal rate, regular rhythm and normal heart sounds.   Pulmonary/Chest: Effort normal and breath sounds normal. No respiratory distress.  Abdominal: Soft. Bowel sounds are normal. She exhibits no distension. There is no tenderness.  Musculoskeletal: She exhibits no edema.  Wheelchair bound  Neurological: She is alert.  Skin: Skin is warm and dry.  Psychiatric: Cognition and memory are impaired. She exhibits abnormal recent memory and abnormal remote memory.  nonverbal She is inattentive.    Labs reviewed: Basic Metabolic Panel:  Recent Labs  01/10/17 1250 01/10/17 2131 01/11/17 0645 02/02/17 1007  NA 138  --  142 139  K 4.1  --  4.2 4.9  CL 104  --  113* 105  CO2 25  --  23 16*  GLUCOSE 153*  --  104* 161*  BUN 18  --  12 21  CREATININE 0.76  --  0.70 0.86  CALCIUM 9.1  --  8.2* 9.1  MG  --   --  1.9  --   PHOS  --   --  4.3  --   TSH  --  6.043*  --   --    Liver Function Tests:  Recent Labs  01/10/17 1250 01/10/17 2131 01/11/17 0645  AST 26 20 22   ALT 33 25 20  ALKPHOS 110 90 77  BILITOT 0.7 0.3 0.5  PROT 7.3 5.9* 5.6*  ALBUMIN 3.5 2.9* 2.7*   No results for input(s): LIPASE, AMYLASE in the last 8760 hours.  Recent Labs  01/10/17 1700  AMMONIA 28   CBC:  Recent Labs  01/10/17 1250 01/11/17 0645  WBC 6.7 5.7  HGB 13.1 10.7*  HCT 40.9 33.1*  MCV 94.5 93.5  PLT 174 128*   Lipid Panel: No results for input(s): CHOL, HDL, LDLCALC, TRIG, CHOLHDL, LDLDIRECT in the last 8760 hours. TSH:  Recent Labs  01/10/17 2131  TSH 6.043*   A1C: Lab Results  Component Value Date   HGBA1C 5.9 (H) 03/18/2013     Assessment/Plan 1. PPD screening test - PPD  2. Need for immunization against influenza - Flu vaccine HIGH DOSE PF (Fluzone High dose)  3. Bowel and bladder incontinence Cont good hygiene and  frequent changes  4. Pseudobulbar affect Stable on neudexta  5. Late onset Alzheimer's disease with behavioral disturbance -progressive decline, now needing to move to assisted living memory unit since her husband  is moving away. FL2 form and paper work completed. She will be requiring total care and daughter reports caregivers will be going with her to help with the transition.   6. Essential hypertension Stable on lisinopril   7. Anxiety and depression Ongoing anxiety and depression due to progressive dementia, will cont current regimen  Next appt: as scheduled, sooner if needed Shandon Burlingame K. Harle Battiest  [facility] [phone] 8 am - 5 pm) [phone] (after hours)

## 2017-03-21 NOTE — Patient Instructions (Signed)
Follow up TB skin test in 2 days

## 2017-03-23 ENCOUNTER — Ambulatory Visit: Payer: Medicare Other

## 2017-03-23 LAB — TB SKIN TEST
INDURATION: 0 mm
TB SKIN TEST: NEGATIVE

## 2017-04-16 DIAGNOSIS — M6281 Muscle weakness (generalized): Secondary | ICD-10-CM | POA: Diagnosis not present

## 2017-04-16 DIAGNOSIS — R262 Difficulty in walking, not elsewhere classified: Secondary | ICD-10-CM | POA: Diagnosis not present

## 2017-04-16 DIAGNOSIS — R2681 Unsteadiness on feet: Secondary | ICD-10-CM | POA: Diagnosis not present

## 2017-04-17 DIAGNOSIS — R2681 Unsteadiness on feet: Secondary | ICD-10-CM | POA: Diagnosis not present

## 2017-04-17 DIAGNOSIS — M6281 Muscle weakness (generalized): Secondary | ICD-10-CM | POA: Diagnosis not present

## 2017-04-17 DIAGNOSIS — R262 Difficulty in walking, not elsewhere classified: Secondary | ICD-10-CM | POA: Diagnosis not present

## 2017-04-18 DIAGNOSIS — R262 Difficulty in walking, not elsewhere classified: Secondary | ICD-10-CM | POA: Diagnosis not present

## 2017-04-18 DIAGNOSIS — M6281 Muscle weakness (generalized): Secondary | ICD-10-CM | POA: Diagnosis not present

## 2017-04-18 DIAGNOSIS — R2681 Unsteadiness on feet: Secondary | ICD-10-CM | POA: Diagnosis not present

## 2017-04-19 DIAGNOSIS — R2681 Unsteadiness on feet: Secondary | ICD-10-CM | POA: Diagnosis not present

## 2017-04-19 DIAGNOSIS — R262 Difficulty in walking, not elsewhere classified: Secondary | ICD-10-CM | POA: Diagnosis not present

## 2017-04-19 DIAGNOSIS — M6281 Muscle weakness (generalized): Secondary | ICD-10-CM | POA: Diagnosis not present

## 2017-04-23 DIAGNOSIS — B351 Tinea unguium: Secondary | ICD-10-CM | POA: Diagnosis not present

## 2017-04-24 DIAGNOSIS — R262 Difficulty in walking, not elsewhere classified: Secondary | ICD-10-CM | POA: Diagnosis not present

## 2017-04-24 DIAGNOSIS — M6281 Muscle weakness (generalized): Secondary | ICD-10-CM | POA: Diagnosis not present

## 2017-04-24 DIAGNOSIS — R2681 Unsteadiness on feet: Secondary | ICD-10-CM | POA: Diagnosis not present

## 2017-04-25 DIAGNOSIS — M6281 Muscle weakness (generalized): Secondary | ICD-10-CM | POA: Diagnosis not present

## 2017-04-25 DIAGNOSIS — R262 Difficulty in walking, not elsewhere classified: Secondary | ICD-10-CM | POA: Diagnosis not present

## 2017-04-25 DIAGNOSIS — R2681 Unsteadiness on feet: Secondary | ICD-10-CM | POA: Diagnosis not present

## 2017-04-26 DIAGNOSIS — R2681 Unsteadiness on feet: Secondary | ICD-10-CM | POA: Diagnosis not present

## 2017-04-26 DIAGNOSIS — R262 Difficulty in walking, not elsewhere classified: Secondary | ICD-10-CM | POA: Diagnosis not present

## 2017-04-26 DIAGNOSIS — M6281 Muscle weakness (generalized): Secondary | ICD-10-CM | POA: Diagnosis not present

## 2017-05-01 DIAGNOSIS — M6281 Muscle weakness (generalized): Secondary | ICD-10-CM | POA: Diagnosis not present

## 2017-05-01 DIAGNOSIS — R262 Difficulty in walking, not elsewhere classified: Secondary | ICD-10-CM | POA: Diagnosis not present

## 2017-05-01 DIAGNOSIS — R2681 Unsteadiness on feet: Secondary | ICD-10-CM | POA: Diagnosis not present

## 2017-05-08 DIAGNOSIS — M6281 Muscle weakness (generalized): Secondary | ICD-10-CM | POA: Diagnosis not present

## 2017-05-08 DIAGNOSIS — R262 Difficulty in walking, not elsewhere classified: Secondary | ICD-10-CM | POA: Diagnosis not present

## 2017-05-08 DIAGNOSIS — R2681 Unsteadiness on feet: Secondary | ICD-10-CM | POA: Diagnosis not present

## 2017-05-09 DIAGNOSIS — M6281 Muscle weakness (generalized): Secondary | ICD-10-CM | POA: Diagnosis not present

## 2017-05-09 DIAGNOSIS — R262 Difficulty in walking, not elsewhere classified: Secondary | ICD-10-CM | POA: Diagnosis not present

## 2017-05-09 DIAGNOSIS — R2681 Unsteadiness on feet: Secondary | ICD-10-CM | POA: Diagnosis not present

## 2017-05-10 DIAGNOSIS — R262 Difficulty in walking, not elsewhere classified: Secondary | ICD-10-CM | POA: Diagnosis not present

## 2017-05-10 DIAGNOSIS — M6281 Muscle weakness (generalized): Secondary | ICD-10-CM | POA: Diagnosis not present

## 2017-05-10 DIAGNOSIS — R2681 Unsteadiness on feet: Secondary | ICD-10-CM | POA: Diagnosis not present

## 2017-05-15 DIAGNOSIS — M6281 Muscle weakness (generalized): Secondary | ICD-10-CM | POA: Diagnosis not present

## 2017-05-15 DIAGNOSIS — R2681 Unsteadiness on feet: Secondary | ICD-10-CM | POA: Diagnosis not present

## 2017-05-15 DIAGNOSIS — R262 Difficulty in walking, not elsewhere classified: Secondary | ICD-10-CM | POA: Diagnosis not present

## 2017-05-18 DIAGNOSIS — R262 Difficulty in walking, not elsewhere classified: Secondary | ICD-10-CM | POA: Diagnosis not present

## 2017-05-18 DIAGNOSIS — R2681 Unsteadiness on feet: Secondary | ICD-10-CM | POA: Diagnosis not present

## 2017-05-18 DIAGNOSIS — M6281 Muscle weakness (generalized): Secondary | ICD-10-CM | POA: Diagnosis not present

## 2017-05-21 ENCOUNTER — Ambulatory Visit: Payer: Medicare Other | Admitting: Internal Medicine

## 2017-05-23 DIAGNOSIS — R2681 Unsteadiness on feet: Secondary | ICD-10-CM | POA: Diagnosis not present

## 2017-05-23 DIAGNOSIS — R262 Difficulty in walking, not elsewhere classified: Secondary | ICD-10-CM | POA: Diagnosis not present

## 2017-05-23 DIAGNOSIS — M6281 Muscle weakness (generalized): Secondary | ICD-10-CM | POA: Diagnosis not present

## 2017-05-24 DIAGNOSIS — M6281 Muscle weakness (generalized): Secondary | ICD-10-CM | POA: Diagnosis not present

## 2017-05-24 DIAGNOSIS — R262 Difficulty in walking, not elsewhere classified: Secondary | ICD-10-CM | POA: Diagnosis not present

## 2017-05-24 DIAGNOSIS — R2681 Unsteadiness on feet: Secondary | ICD-10-CM | POA: Diagnosis not present

## 2017-05-26 DIAGNOSIS — M6281 Muscle weakness (generalized): Secondary | ICD-10-CM | POA: Diagnosis not present

## 2017-05-26 DIAGNOSIS — R2681 Unsteadiness on feet: Secondary | ICD-10-CM | POA: Diagnosis not present

## 2017-05-26 DIAGNOSIS — R262 Difficulty in walking, not elsewhere classified: Secondary | ICD-10-CM | POA: Diagnosis not present

## 2017-05-28 DIAGNOSIS — R262 Difficulty in walking, not elsewhere classified: Secondary | ICD-10-CM | POA: Diagnosis not present

## 2017-05-28 DIAGNOSIS — R2681 Unsteadiness on feet: Secondary | ICD-10-CM | POA: Diagnosis not present

## 2017-05-28 DIAGNOSIS — M6281 Muscle weakness (generalized): Secondary | ICD-10-CM | POA: Diagnosis not present

## 2017-05-29 DIAGNOSIS — R2681 Unsteadiness on feet: Secondary | ICD-10-CM | POA: Diagnosis not present

## 2017-05-29 DIAGNOSIS — M6281 Muscle weakness (generalized): Secondary | ICD-10-CM | POA: Diagnosis not present

## 2017-05-29 DIAGNOSIS — R262 Difficulty in walking, not elsewhere classified: Secondary | ICD-10-CM | POA: Diagnosis not present

## 2017-05-31 DIAGNOSIS — R262 Difficulty in walking, not elsewhere classified: Secondary | ICD-10-CM | POA: Diagnosis not present

## 2017-05-31 DIAGNOSIS — M6281 Muscle weakness (generalized): Secondary | ICD-10-CM | POA: Diagnosis not present

## 2017-05-31 DIAGNOSIS — R2681 Unsteadiness on feet: Secondary | ICD-10-CM | POA: Diagnosis not present

## 2017-06-01 DIAGNOSIS — M6281 Muscle weakness (generalized): Secondary | ICD-10-CM | POA: Diagnosis not present

## 2017-06-01 DIAGNOSIS — R2681 Unsteadiness on feet: Secondary | ICD-10-CM | POA: Diagnosis not present

## 2017-06-01 DIAGNOSIS — R262 Difficulty in walking, not elsewhere classified: Secondary | ICD-10-CM | POA: Diagnosis not present

## 2017-06-04 DIAGNOSIS — M6281 Muscle weakness (generalized): Secondary | ICD-10-CM | POA: Diagnosis not present

## 2017-06-04 DIAGNOSIS — R262 Difficulty in walking, not elsewhere classified: Secondary | ICD-10-CM | POA: Diagnosis not present

## 2017-06-04 DIAGNOSIS — R2681 Unsteadiness on feet: Secondary | ICD-10-CM | POA: Diagnosis not present

## 2017-06-06 DIAGNOSIS — R262 Difficulty in walking, not elsewhere classified: Secondary | ICD-10-CM | POA: Diagnosis not present

## 2017-06-06 DIAGNOSIS — R2681 Unsteadiness on feet: Secondary | ICD-10-CM | POA: Diagnosis not present

## 2017-06-06 DIAGNOSIS — M6281 Muscle weakness (generalized): Secondary | ICD-10-CM | POA: Diagnosis not present

## 2017-06-07 DIAGNOSIS — M6281 Muscle weakness (generalized): Secondary | ICD-10-CM | POA: Diagnosis not present

## 2017-06-07 DIAGNOSIS — R2681 Unsteadiness on feet: Secondary | ICD-10-CM | POA: Diagnosis not present

## 2017-06-07 DIAGNOSIS — R262 Difficulty in walking, not elsewhere classified: Secondary | ICD-10-CM | POA: Diagnosis not present

## 2017-06-13 DIAGNOSIS — R278 Other lack of coordination: Secondary | ICD-10-CM | POA: Diagnosis not present

## 2017-06-13 DIAGNOSIS — M6281 Muscle weakness (generalized): Secondary | ICD-10-CM | POA: Diagnosis not present

## 2017-06-13 DIAGNOSIS — R2681 Unsteadiness on feet: Secondary | ICD-10-CM | POA: Diagnosis not present

## 2017-06-13 DIAGNOSIS — R262 Difficulty in walking, not elsewhere classified: Secondary | ICD-10-CM | POA: Diagnosis not present

## 2017-06-13 DIAGNOSIS — R293 Abnormal posture: Secondary | ICD-10-CM | POA: Diagnosis not present

## 2017-06-14 DIAGNOSIS — R293 Abnormal posture: Secondary | ICD-10-CM | POA: Diagnosis not present

## 2017-06-14 DIAGNOSIS — R262 Difficulty in walking, not elsewhere classified: Secondary | ICD-10-CM | POA: Diagnosis not present

## 2017-06-14 DIAGNOSIS — R2681 Unsteadiness on feet: Secondary | ICD-10-CM | POA: Diagnosis not present

## 2017-06-14 DIAGNOSIS — R278 Other lack of coordination: Secondary | ICD-10-CM | POA: Diagnosis not present

## 2017-06-14 DIAGNOSIS — M6281 Muscle weakness (generalized): Secondary | ICD-10-CM | POA: Diagnosis not present

## 2017-06-15 DIAGNOSIS — M6281 Muscle weakness (generalized): Secondary | ICD-10-CM | POA: Diagnosis not present

## 2017-06-15 DIAGNOSIS — R2681 Unsteadiness on feet: Secondary | ICD-10-CM | POA: Diagnosis not present

## 2017-06-15 DIAGNOSIS — R293 Abnormal posture: Secondary | ICD-10-CM | POA: Diagnosis not present

## 2017-06-15 DIAGNOSIS — R278 Other lack of coordination: Secondary | ICD-10-CM | POA: Diagnosis not present

## 2017-06-15 DIAGNOSIS — R262 Difficulty in walking, not elsewhere classified: Secondary | ICD-10-CM | POA: Diagnosis not present

## 2017-06-18 DIAGNOSIS — R278 Other lack of coordination: Secondary | ICD-10-CM | POA: Diagnosis not present

## 2017-06-18 DIAGNOSIS — M6281 Muscle weakness (generalized): Secondary | ICD-10-CM | POA: Diagnosis not present

## 2017-06-18 DIAGNOSIS — R293 Abnormal posture: Secondary | ICD-10-CM | POA: Diagnosis not present

## 2017-06-18 DIAGNOSIS — R262 Difficulty in walking, not elsewhere classified: Secondary | ICD-10-CM | POA: Diagnosis not present

## 2017-06-18 DIAGNOSIS — R2681 Unsteadiness on feet: Secondary | ICD-10-CM | POA: Diagnosis not present

## 2017-06-19 DIAGNOSIS — R262 Difficulty in walking, not elsewhere classified: Secondary | ICD-10-CM | POA: Diagnosis not present

## 2017-06-19 DIAGNOSIS — R293 Abnormal posture: Secondary | ICD-10-CM | POA: Diagnosis not present

## 2017-06-19 DIAGNOSIS — R2681 Unsteadiness on feet: Secondary | ICD-10-CM | POA: Diagnosis not present

## 2017-06-19 DIAGNOSIS — M6281 Muscle weakness (generalized): Secondary | ICD-10-CM | POA: Diagnosis not present

## 2017-06-19 DIAGNOSIS — R278 Other lack of coordination: Secondary | ICD-10-CM | POA: Diagnosis not present

## 2017-06-20 DIAGNOSIS — R278 Other lack of coordination: Secondary | ICD-10-CM | POA: Diagnosis not present

## 2017-06-20 DIAGNOSIS — R262 Difficulty in walking, not elsewhere classified: Secondary | ICD-10-CM | POA: Diagnosis not present

## 2017-06-20 DIAGNOSIS — R2681 Unsteadiness on feet: Secondary | ICD-10-CM | POA: Diagnosis not present

## 2017-06-20 DIAGNOSIS — R293 Abnormal posture: Secondary | ICD-10-CM | POA: Diagnosis not present

## 2017-06-20 DIAGNOSIS — M6281 Muscle weakness (generalized): Secondary | ICD-10-CM | POA: Diagnosis not present

## 2017-06-21 DIAGNOSIS — R262 Difficulty in walking, not elsewhere classified: Secondary | ICD-10-CM | POA: Diagnosis not present

## 2017-06-21 DIAGNOSIS — R2681 Unsteadiness on feet: Secondary | ICD-10-CM | POA: Diagnosis not present

## 2017-06-21 DIAGNOSIS — R278 Other lack of coordination: Secondary | ICD-10-CM | POA: Diagnosis not present

## 2017-06-21 DIAGNOSIS — M6281 Muscle weakness (generalized): Secondary | ICD-10-CM | POA: Diagnosis not present

## 2017-06-21 DIAGNOSIS — R293 Abnormal posture: Secondary | ICD-10-CM | POA: Diagnosis not present

## 2017-06-22 DIAGNOSIS — R2681 Unsteadiness on feet: Secondary | ICD-10-CM | POA: Diagnosis not present

## 2017-06-22 DIAGNOSIS — R278 Other lack of coordination: Secondary | ICD-10-CM | POA: Diagnosis not present

## 2017-06-22 DIAGNOSIS — M6281 Muscle weakness (generalized): Secondary | ICD-10-CM | POA: Diagnosis not present

## 2017-06-22 DIAGNOSIS — R262 Difficulty in walking, not elsewhere classified: Secondary | ICD-10-CM | POA: Diagnosis not present

## 2017-06-22 DIAGNOSIS — R293 Abnormal posture: Secondary | ICD-10-CM | POA: Diagnosis not present

## 2017-06-25 DIAGNOSIS — R293 Abnormal posture: Secondary | ICD-10-CM | POA: Diagnosis not present

## 2017-06-25 DIAGNOSIS — M6281 Muscle weakness (generalized): Secondary | ICD-10-CM | POA: Diagnosis not present

## 2017-06-25 DIAGNOSIS — R262 Difficulty in walking, not elsewhere classified: Secondary | ICD-10-CM | POA: Diagnosis not present

## 2017-06-25 DIAGNOSIS — R2681 Unsteadiness on feet: Secondary | ICD-10-CM | POA: Diagnosis not present

## 2017-06-25 DIAGNOSIS — R278 Other lack of coordination: Secondary | ICD-10-CM | POA: Diagnosis not present

## 2017-06-26 DIAGNOSIS — R278 Other lack of coordination: Secondary | ICD-10-CM | POA: Diagnosis not present

## 2017-06-26 DIAGNOSIS — M6281 Muscle weakness (generalized): Secondary | ICD-10-CM | POA: Diagnosis not present

## 2017-06-26 DIAGNOSIS — R293 Abnormal posture: Secondary | ICD-10-CM | POA: Diagnosis not present

## 2017-06-26 DIAGNOSIS — R2681 Unsteadiness on feet: Secondary | ICD-10-CM | POA: Diagnosis not present

## 2017-06-26 DIAGNOSIS — R262 Difficulty in walking, not elsewhere classified: Secondary | ICD-10-CM | POA: Diagnosis not present

## 2017-06-27 DIAGNOSIS — R278 Other lack of coordination: Secondary | ICD-10-CM | POA: Diagnosis not present

## 2017-06-27 DIAGNOSIS — R2681 Unsteadiness on feet: Secondary | ICD-10-CM | POA: Diagnosis not present

## 2017-06-27 DIAGNOSIS — M6281 Muscle weakness (generalized): Secondary | ICD-10-CM | POA: Diagnosis not present

## 2017-06-27 DIAGNOSIS — R293 Abnormal posture: Secondary | ICD-10-CM | POA: Diagnosis not present

## 2017-06-27 DIAGNOSIS — R262 Difficulty in walking, not elsewhere classified: Secondary | ICD-10-CM | POA: Diagnosis not present

## 2017-06-28 DIAGNOSIS — R278 Other lack of coordination: Secondary | ICD-10-CM | POA: Diagnosis not present

## 2017-06-28 DIAGNOSIS — M6281 Muscle weakness (generalized): Secondary | ICD-10-CM | POA: Diagnosis not present

## 2017-06-28 DIAGNOSIS — R293 Abnormal posture: Secondary | ICD-10-CM | POA: Diagnosis not present

## 2017-06-28 DIAGNOSIS — R2681 Unsteadiness on feet: Secondary | ICD-10-CM | POA: Diagnosis not present

## 2017-06-28 DIAGNOSIS — R262 Difficulty in walking, not elsewhere classified: Secondary | ICD-10-CM | POA: Diagnosis not present

## 2017-06-29 DIAGNOSIS — M6281 Muscle weakness (generalized): Secondary | ICD-10-CM | POA: Diagnosis not present

## 2017-06-29 DIAGNOSIS — R278 Other lack of coordination: Secondary | ICD-10-CM | POA: Diagnosis not present

## 2017-06-29 DIAGNOSIS — R262 Difficulty in walking, not elsewhere classified: Secondary | ICD-10-CM | POA: Diagnosis not present

## 2017-06-29 DIAGNOSIS — R293 Abnormal posture: Secondary | ICD-10-CM | POA: Diagnosis not present

## 2017-06-29 DIAGNOSIS — R2681 Unsteadiness on feet: Secondary | ICD-10-CM | POA: Diagnosis not present

## 2017-07-02 DIAGNOSIS — R293 Abnormal posture: Secondary | ICD-10-CM | POA: Diagnosis not present

## 2017-07-02 DIAGNOSIS — R262 Difficulty in walking, not elsewhere classified: Secondary | ICD-10-CM | POA: Diagnosis not present

## 2017-07-02 DIAGNOSIS — R278 Other lack of coordination: Secondary | ICD-10-CM | POA: Diagnosis not present

## 2017-07-02 DIAGNOSIS — R2681 Unsteadiness on feet: Secondary | ICD-10-CM | POA: Diagnosis not present

## 2017-07-02 DIAGNOSIS — M6281 Muscle weakness (generalized): Secondary | ICD-10-CM | POA: Diagnosis not present

## 2017-07-03 DIAGNOSIS — R2681 Unsteadiness on feet: Secondary | ICD-10-CM | POA: Diagnosis not present

## 2017-07-03 DIAGNOSIS — R293 Abnormal posture: Secondary | ICD-10-CM | POA: Diagnosis not present

## 2017-07-03 DIAGNOSIS — M6281 Muscle weakness (generalized): Secondary | ICD-10-CM | POA: Diagnosis not present

## 2017-07-03 DIAGNOSIS — R262 Difficulty in walking, not elsewhere classified: Secondary | ICD-10-CM | POA: Diagnosis not present

## 2017-07-03 DIAGNOSIS — R278 Other lack of coordination: Secondary | ICD-10-CM | POA: Diagnosis not present

## 2017-07-04 DIAGNOSIS — M6281 Muscle weakness (generalized): Secondary | ICD-10-CM | POA: Diagnosis not present

## 2017-07-04 DIAGNOSIS — R278 Other lack of coordination: Secondary | ICD-10-CM | POA: Diagnosis not present

## 2017-07-04 DIAGNOSIS — R293 Abnormal posture: Secondary | ICD-10-CM | POA: Diagnosis not present

## 2017-07-04 DIAGNOSIS — R262 Difficulty in walking, not elsewhere classified: Secondary | ICD-10-CM | POA: Diagnosis not present

## 2017-07-04 DIAGNOSIS — R2681 Unsteadiness on feet: Secondary | ICD-10-CM | POA: Diagnosis not present

## 2017-07-05 DIAGNOSIS — R2681 Unsteadiness on feet: Secondary | ICD-10-CM | POA: Diagnosis not present

## 2017-07-05 DIAGNOSIS — R293 Abnormal posture: Secondary | ICD-10-CM | POA: Diagnosis not present

## 2017-07-05 DIAGNOSIS — M6281 Muscle weakness (generalized): Secondary | ICD-10-CM | POA: Diagnosis not present

## 2017-07-05 DIAGNOSIS — R278 Other lack of coordination: Secondary | ICD-10-CM | POA: Diagnosis not present

## 2017-07-05 DIAGNOSIS — R262 Difficulty in walking, not elsewhere classified: Secondary | ICD-10-CM | POA: Diagnosis not present

## 2017-07-06 DIAGNOSIS — R278 Other lack of coordination: Secondary | ICD-10-CM | POA: Diagnosis not present

## 2017-07-06 DIAGNOSIS — R293 Abnormal posture: Secondary | ICD-10-CM | POA: Diagnosis not present

## 2017-07-06 DIAGNOSIS — R262 Difficulty in walking, not elsewhere classified: Secondary | ICD-10-CM | POA: Diagnosis not present

## 2017-07-06 DIAGNOSIS — M6281 Muscle weakness (generalized): Secondary | ICD-10-CM | POA: Diagnosis not present

## 2017-07-06 DIAGNOSIS — R2681 Unsteadiness on feet: Secondary | ICD-10-CM | POA: Diagnosis not present

## 2017-07-09 DIAGNOSIS — R262 Difficulty in walking, not elsewhere classified: Secondary | ICD-10-CM | POA: Diagnosis not present

## 2017-07-09 DIAGNOSIS — M6281 Muscle weakness (generalized): Secondary | ICD-10-CM | POA: Diagnosis not present

## 2017-07-09 DIAGNOSIS — R2681 Unsteadiness on feet: Secondary | ICD-10-CM | POA: Diagnosis not present

## 2017-07-09 DIAGNOSIS — R278 Other lack of coordination: Secondary | ICD-10-CM | POA: Diagnosis not present

## 2017-07-09 DIAGNOSIS — R293 Abnormal posture: Secondary | ICD-10-CM | POA: Diagnosis not present

## 2017-07-10 DIAGNOSIS — R2681 Unsteadiness on feet: Secondary | ICD-10-CM | POA: Diagnosis not present

## 2017-07-10 DIAGNOSIS — R278 Other lack of coordination: Secondary | ICD-10-CM | POA: Diagnosis not present

## 2017-07-10 DIAGNOSIS — R293 Abnormal posture: Secondary | ICD-10-CM | POA: Diagnosis not present

## 2017-07-10 DIAGNOSIS — R262 Difficulty in walking, not elsewhere classified: Secondary | ICD-10-CM | POA: Diagnosis not present

## 2017-07-10 DIAGNOSIS — M6281 Muscle weakness (generalized): Secondary | ICD-10-CM | POA: Diagnosis not present

## 2017-07-11 DIAGNOSIS — R2681 Unsteadiness on feet: Secondary | ICD-10-CM | POA: Diagnosis not present

## 2017-07-11 DIAGNOSIS — M6281 Muscle weakness (generalized): Secondary | ICD-10-CM | POA: Diagnosis not present

## 2017-07-11 DIAGNOSIS — R262 Difficulty in walking, not elsewhere classified: Secondary | ICD-10-CM | POA: Diagnosis not present

## 2017-07-11 DIAGNOSIS — R278 Other lack of coordination: Secondary | ICD-10-CM | POA: Diagnosis not present

## 2017-07-11 DIAGNOSIS — R293 Abnormal posture: Secondary | ICD-10-CM | POA: Diagnosis not present

## 2017-07-12 DIAGNOSIS — R278 Other lack of coordination: Secondary | ICD-10-CM | POA: Diagnosis not present

## 2017-07-12 DIAGNOSIS — R262 Difficulty in walking, not elsewhere classified: Secondary | ICD-10-CM | POA: Diagnosis not present

## 2017-07-12 DIAGNOSIS — R2681 Unsteadiness on feet: Secondary | ICD-10-CM | POA: Diagnosis not present

## 2017-07-12 DIAGNOSIS — R293 Abnormal posture: Secondary | ICD-10-CM | POA: Diagnosis not present

## 2017-07-12 DIAGNOSIS — M6281 Muscle weakness (generalized): Secondary | ICD-10-CM | POA: Diagnosis not present

## 2017-07-13 DIAGNOSIS — R278 Other lack of coordination: Secondary | ICD-10-CM | POA: Diagnosis not present

## 2017-07-13 DIAGNOSIS — M6281 Muscle weakness (generalized): Secondary | ICD-10-CM | POA: Diagnosis not present

## 2017-07-13 DIAGNOSIS — R293 Abnormal posture: Secondary | ICD-10-CM | POA: Diagnosis not present

## 2017-07-13 DIAGNOSIS — R262 Difficulty in walking, not elsewhere classified: Secondary | ICD-10-CM | POA: Diagnosis not present

## 2017-07-13 DIAGNOSIS — R2681 Unsteadiness on feet: Secondary | ICD-10-CM | POA: Diagnosis not present

## 2017-07-16 DIAGNOSIS — R293 Abnormal posture: Secondary | ICD-10-CM | POA: Diagnosis not present

## 2017-07-16 DIAGNOSIS — M6281 Muscle weakness (generalized): Secondary | ICD-10-CM | POA: Diagnosis not present

## 2017-07-16 DIAGNOSIS — R278 Other lack of coordination: Secondary | ICD-10-CM | POA: Diagnosis not present

## 2017-07-16 DIAGNOSIS — R2681 Unsteadiness on feet: Secondary | ICD-10-CM | POA: Diagnosis not present

## 2017-07-16 DIAGNOSIS — R262 Difficulty in walking, not elsewhere classified: Secondary | ICD-10-CM | POA: Diagnosis not present

## 2017-07-17 DIAGNOSIS — R278 Other lack of coordination: Secondary | ICD-10-CM | POA: Diagnosis not present

## 2017-07-17 DIAGNOSIS — M6281 Muscle weakness (generalized): Secondary | ICD-10-CM | POA: Diagnosis not present

## 2017-07-17 DIAGNOSIS — R262 Difficulty in walking, not elsewhere classified: Secondary | ICD-10-CM | POA: Diagnosis not present

## 2017-07-17 DIAGNOSIS — R2681 Unsteadiness on feet: Secondary | ICD-10-CM | POA: Diagnosis not present

## 2017-07-17 DIAGNOSIS — R293 Abnormal posture: Secondary | ICD-10-CM | POA: Diagnosis not present

## 2017-07-18 DIAGNOSIS — M6281 Muscle weakness (generalized): Secondary | ICD-10-CM | POA: Diagnosis not present

## 2017-07-18 DIAGNOSIS — R2681 Unsteadiness on feet: Secondary | ICD-10-CM | POA: Diagnosis not present

## 2017-07-18 DIAGNOSIS — R293 Abnormal posture: Secondary | ICD-10-CM | POA: Diagnosis not present

## 2017-07-18 DIAGNOSIS — R262 Difficulty in walking, not elsewhere classified: Secondary | ICD-10-CM | POA: Diagnosis not present

## 2017-07-18 DIAGNOSIS — R278 Other lack of coordination: Secondary | ICD-10-CM | POA: Diagnosis not present

## 2017-07-19 DIAGNOSIS — R293 Abnormal posture: Secondary | ICD-10-CM | POA: Diagnosis not present

## 2017-07-19 DIAGNOSIS — R278 Other lack of coordination: Secondary | ICD-10-CM | POA: Diagnosis not present

## 2017-07-19 DIAGNOSIS — M6281 Muscle weakness (generalized): Secondary | ICD-10-CM | POA: Diagnosis not present

## 2017-07-19 DIAGNOSIS — R2681 Unsteadiness on feet: Secondary | ICD-10-CM | POA: Diagnosis not present

## 2017-07-19 DIAGNOSIS — R262 Difficulty in walking, not elsewhere classified: Secondary | ICD-10-CM | POA: Diagnosis not present

## 2017-07-23 DIAGNOSIS — R278 Other lack of coordination: Secondary | ICD-10-CM | POA: Diagnosis not present

## 2017-07-23 DIAGNOSIS — R293 Abnormal posture: Secondary | ICD-10-CM | POA: Diagnosis not present

## 2017-07-23 DIAGNOSIS — R2681 Unsteadiness on feet: Secondary | ICD-10-CM | POA: Diagnosis not present

## 2017-07-23 DIAGNOSIS — M6281 Muscle weakness (generalized): Secondary | ICD-10-CM | POA: Diagnosis not present

## 2017-07-23 DIAGNOSIS — R262 Difficulty in walking, not elsewhere classified: Secondary | ICD-10-CM | POA: Diagnosis not present

## 2017-07-24 DIAGNOSIS — M6281 Muscle weakness (generalized): Secondary | ICD-10-CM | POA: Diagnosis not present

## 2017-07-24 DIAGNOSIS — R293 Abnormal posture: Secondary | ICD-10-CM | POA: Diagnosis not present

## 2017-07-24 DIAGNOSIS — R262 Difficulty in walking, not elsewhere classified: Secondary | ICD-10-CM | POA: Diagnosis not present

## 2017-07-24 DIAGNOSIS — R2681 Unsteadiness on feet: Secondary | ICD-10-CM | POA: Diagnosis not present

## 2017-07-24 DIAGNOSIS — R278 Other lack of coordination: Secondary | ICD-10-CM | POA: Diagnosis not present

## 2017-07-25 DIAGNOSIS — R262 Difficulty in walking, not elsewhere classified: Secondary | ICD-10-CM | POA: Diagnosis not present

## 2017-07-25 DIAGNOSIS — R2681 Unsteadiness on feet: Secondary | ICD-10-CM | POA: Diagnosis not present

## 2017-07-25 DIAGNOSIS — R278 Other lack of coordination: Secondary | ICD-10-CM | POA: Diagnosis not present

## 2017-07-25 DIAGNOSIS — R293 Abnormal posture: Secondary | ICD-10-CM | POA: Diagnosis not present

## 2017-07-25 DIAGNOSIS — M6281 Muscle weakness (generalized): Secondary | ICD-10-CM | POA: Diagnosis not present

## 2017-07-26 DIAGNOSIS — M6281 Muscle weakness (generalized): Secondary | ICD-10-CM | POA: Diagnosis not present

## 2017-07-26 DIAGNOSIS — R262 Difficulty in walking, not elsewhere classified: Secondary | ICD-10-CM | POA: Diagnosis not present

## 2017-07-26 DIAGNOSIS — R278 Other lack of coordination: Secondary | ICD-10-CM | POA: Diagnosis not present

## 2017-07-26 DIAGNOSIS — R2681 Unsteadiness on feet: Secondary | ICD-10-CM | POA: Diagnosis not present

## 2017-07-26 DIAGNOSIS — R293 Abnormal posture: Secondary | ICD-10-CM | POA: Diagnosis not present

## 2017-07-30 DIAGNOSIS — R278 Other lack of coordination: Secondary | ICD-10-CM | POA: Diagnosis not present

## 2017-07-30 DIAGNOSIS — M6281 Muscle weakness (generalized): Secondary | ICD-10-CM | POA: Diagnosis not present

## 2017-07-30 DIAGNOSIS — R293 Abnormal posture: Secondary | ICD-10-CM | POA: Diagnosis not present

## 2017-07-30 DIAGNOSIS — R262 Difficulty in walking, not elsewhere classified: Secondary | ICD-10-CM | POA: Diagnosis not present

## 2017-07-30 DIAGNOSIS — R2681 Unsteadiness on feet: Secondary | ICD-10-CM | POA: Diagnosis not present

## 2017-07-31 DIAGNOSIS — R2681 Unsteadiness on feet: Secondary | ICD-10-CM | POA: Diagnosis not present

## 2017-07-31 DIAGNOSIS — R293 Abnormal posture: Secondary | ICD-10-CM | POA: Diagnosis not present

## 2017-07-31 DIAGNOSIS — R278 Other lack of coordination: Secondary | ICD-10-CM | POA: Diagnosis not present

## 2017-07-31 DIAGNOSIS — R262 Difficulty in walking, not elsewhere classified: Secondary | ICD-10-CM | POA: Diagnosis not present

## 2017-07-31 DIAGNOSIS — M6281 Muscle weakness (generalized): Secondary | ICD-10-CM | POA: Diagnosis not present

## 2017-08-01 DIAGNOSIS — R293 Abnormal posture: Secondary | ICD-10-CM | POA: Diagnosis not present

## 2017-08-01 DIAGNOSIS — M6281 Muscle weakness (generalized): Secondary | ICD-10-CM | POA: Diagnosis not present

## 2017-08-01 DIAGNOSIS — R278 Other lack of coordination: Secondary | ICD-10-CM | POA: Diagnosis not present

## 2017-08-01 DIAGNOSIS — R262 Difficulty in walking, not elsewhere classified: Secondary | ICD-10-CM | POA: Diagnosis not present

## 2017-08-01 DIAGNOSIS — R2681 Unsteadiness on feet: Secondary | ICD-10-CM | POA: Diagnosis not present

## 2017-08-02 DIAGNOSIS — R2681 Unsteadiness on feet: Secondary | ICD-10-CM | POA: Diagnosis not present

## 2017-08-02 DIAGNOSIS — R262 Difficulty in walking, not elsewhere classified: Secondary | ICD-10-CM | POA: Diagnosis not present

## 2017-08-02 DIAGNOSIS — R278 Other lack of coordination: Secondary | ICD-10-CM | POA: Diagnosis not present

## 2017-08-02 DIAGNOSIS — R293 Abnormal posture: Secondary | ICD-10-CM | POA: Diagnosis not present

## 2017-08-02 DIAGNOSIS — M6281 Muscle weakness (generalized): Secondary | ICD-10-CM | POA: Diagnosis not present

## 2017-08-05 DIAGNOSIS — L603 Nail dystrophy: Secondary | ICD-10-CM | POA: Diagnosis not present

## 2017-08-06 DIAGNOSIS — R278 Other lack of coordination: Secondary | ICD-10-CM | POA: Diagnosis not present

## 2017-08-06 DIAGNOSIS — M6281 Muscle weakness (generalized): Secondary | ICD-10-CM | POA: Diagnosis not present

## 2017-08-06 DIAGNOSIS — R262 Difficulty in walking, not elsewhere classified: Secondary | ICD-10-CM | POA: Diagnosis not present

## 2017-08-06 DIAGNOSIS — R293 Abnormal posture: Secondary | ICD-10-CM | POA: Diagnosis not present

## 2017-08-06 DIAGNOSIS — R2681 Unsteadiness on feet: Secondary | ICD-10-CM | POA: Diagnosis not present

## 2017-08-07 DIAGNOSIS — R278 Other lack of coordination: Secondary | ICD-10-CM | POA: Diagnosis not present

## 2017-08-07 DIAGNOSIS — R2681 Unsteadiness on feet: Secondary | ICD-10-CM | POA: Diagnosis not present

## 2017-08-07 DIAGNOSIS — R293 Abnormal posture: Secondary | ICD-10-CM | POA: Diagnosis not present

## 2017-08-07 DIAGNOSIS — R262 Difficulty in walking, not elsewhere classified: Secondary | ICD-10-CM | POA: Diagnosis not present

## 2017-08-07 DIAGNOSIS — M6281 Muscle weakness (generalized): Secondary | ICD-10-CM | POA: Diagnosis not present

## 2017-08-08 DIAGNOSIS — R2681 Unsteadiness on feet: Secondary | ICD-10-CM | POA: Diagnosis not present

## 2017-08-08 DIAGNOSIS — M6281 Muscle weakness (generalized): Secondary | ICD-10-CM | POA: Diagnosis not present

## 2017-08-08 DIAGNOSIS — R293 Abnormal posture: Secondary | ICD-10-CM | POA: Diagnosis not present

## 2017-08-08 DIAGNOSIS — R262 Difficulty in walking, not elsewhere classified: Secondary | ICD-10-CM | POA: Diagnosis not present

## 2017-08-08 DIAGNOSIS — R278 Other lack of coordination: Secondary | ICD-10-CM | POA: Diagnosis not present

## 2017-08-09 DIAGNOSIS — R278 Other lack of coordination: Secondary | ICD-10-CM | POA: Diagnosis not present

## 2017-08-09 DIAGNOSIS — R293 Abnormal posture: Secondary | ICD-10-CM | POA: Diagnosis not present

## 2017-08-09 DIAGNOSIS — M6281 Muscle weakness (generalized): Secondary | ICD-10-CM | POA: Diagnosis not present

## 2017-08-09 DIAGNOSIS — R262 Difficulty in walking, not elsewhere classified: Secondary | ICD-10-CM | POA: Diagnosis not present

## 2017-08-09 DIAGNOSIS — R2681 Unsteadiness on feet: Secondary | ICD-10-CM | POA: Diagnosis not present

## 2017-08-13 DIAGNOSIS — R293 Abnormal posture: Secondary | ICD-10-CM | POA: Diagnosis not present

## 2017-08-13 DIAGNOSIS — R278 Other lack of coordination: Secondary | ICD-10-CM | POA: Diagnosis not present

## 2017-08-13 DIAGNOSIS — M6281 Muscle weakness (generalized): Secondary | ICD-10-CM | POA: Diagnosis not present

## 2017-08-13 DIAGNOSIS — R2681 Unsteadiness on feet: Secondary | ICD-10-CM | POA: Diagnosis not present

## 2017-08-14 DIAGNOSIS — M6281 Muscle weakness (generalized): Secondary | ICD-10-CM | POA: Diagnosis not present

## 2017-08-14 DIAGNOSIS — R293 Abnormal posture: Secondary | ICD-10-CM | POA: Diagnosis not present

## 2017-08-14 DIAGNOSIS — R2681 Unsteadiness on feet: Secondary | ICD-10-CM | POA: Diagnosis not present

## 2017-08-14 DIAGNOSIS — R278 Other lack of coordination: Secondary | ICD-10-CM | POA: Diagnosis not present

## 2017-08-16 DIAGNOSIS — R2681 Unsteadiness on feet: Secondary | ICD-10-CM | POA: Diagnosis not present

## 2017-08-16 DIAGNOSIS — R278 Other lack of coordination: Secondary | ICD-10-CM | POA: Diagnosis not present

## 2017-08-16 DIAGNOSIS — M6281 Muscle weakness (generalized): Secondary | ICD-10-CM | POA: Diagnosis not present

## 2017-08-16 DIAGNOSIS — R293 Abnormal posture: Secondary | ICD-10-CM | POA: Diagnosis not present

## 2017-08-21 DIAGNOSIS — R2681 Unsteadiness on feet: Secondary | ICD-10-CM | POA: Diagnosis not present

## 2017-08-21 DIAGNOSIS — R278 Other lack of coordination: Secondary | ICD-10-CM | POA: Diagnosis not present

## 2017-08-21 DIAGNOSIS — R293 Abnormal posture: Secondary | ICD-10-CM | POA: Diagnosis not present

## 2017-08-21 DIAGNOSIS — M6281 Muscle weakness (generalized): Secondary | ICD-10-CM | POA: Diagnosis not present

## 2017-08-22 DIAGNOSIS — R278 Other lack of coordination: Secondary | ICD-10-CM | POA: Diagnosis not present

## 2017-08-22 DIAGNOSIS — R293 Abnormal posture: Secondary | ICD-10-CM | POA: Diagnosis not present

## 2017-08-22 DIAGNOSIS — M6281 Muscle weakness (generalized): Secondary | ICD-10-CM | POA: Diagnosis not present

## 2017-08-22 DIAGNOSIS — R2681 Unsteadiness on feet: Secondary | ICD-10-CM | POA: Diagnosis not present

## 2017-08-24 DIAGNOSIS — M6281 Muscle weakness (generalized): Secondary | ICD-10-CM | POA: Diagnosis not present

## 2017-08-24 DIAGNOSIS — R278 Other lack of coordination: Secondary | ICD-10-CM | POA: Diagnosis not present

## 2017-08-24 DIAGNOSIS — R2681 Unsteadiness on feet: Secondary | ICD-10-CM | POA: Diagnosis not present

## 2017-08-24 DIAGNOSIS — R293 Abnormal posture: Secondary | ICD-10-CM | POA: Diagnosis not present

## 2017-08-28 DIAGNOSIS — M6281 Muscle weakness (generalized): Secondary | ICD-10-CM | POA: Diagnosis not present

## 2017-08-28 DIAGNOSIS — R278 Other lack of coordination: Secondary | ICD-10-CM | POA: Diagnosis not present

## 2017-08-28 DIAGNOSIS — R293 Abnormal posture: Secondary | ICD-10-CM | POA: Diagnosis not present

## 2017-08-28 DIAGNOSIS — R2681 Unsteadiness on feet: Secondary | ICD-10-CM | POA: Diagnosis not present

## 2017-08-29 DIAGNOSIS — R293 Abnormal posture: Secondary | ICD-10-CM | POA: Diagnosis not present

## 2017-08-29 DIAGNOSIS — R2681 Unsteadiness on feet: Secondary | ICD-10-CM | POA: Diagnosis not present

## 2017-08-29 DIAGNOSIS — R278 Other lack of coordination: Secondary | ICD-10-CM | POA: Diagnosis not present

## 2017-08-29 DIAGNOSIS — M6281 Muscle weakness (generalized): Secondary | ICD-10-CM | POA: Diagnosis not present

## 2017-08-30 DIAGNOSIS — M6281 Muscle weakness (generalized): Secondary | ICD-10-CM | POA: Diagnosis not present

## 2017-08-30 DIAGNOSIS — R278 Other lack of coordination: Secondary | ICD-10-CM | POA: Diagnosis not present

## 2017-08-30 DIAGNOSIS — R293 Abnormal posture: Secondary | ICD-10-CM | POA: Diagnosis not present

## 2017-08-30 DIAGNOSIS — R2681 Unsteadiness on feet: Secondary | ICD-10-CM | POA: Diagnosis not present

## 2017-08-31 DIAGNOSIS — R293 Abnormal posture: Secondary | ICD-10-CM | POA: Diagnosis not present

## 2017-08-31 DIAGNOSIS — R278 Other lack of coordination: Secondary | ICD-10-CM | POA: Diagnosis not present

## 2017-08-31 DIAGNOSIS — M6281 Muscle weakness (generalized): Secondary | ICD-10-CM | POA: Diagnosis not present

## 2017-08-31 DIAGNOSIS — R2681 Unsteadiness on feet: Secondary | ICD-10-CM | POA: Diagnosis not present

## 2017-09-04 DIAGNOSIS — R278 Other lack of coordination: Secondary | ICD-10-CM | POA: Diagnosis not present

## 2017-09-04 DIAGNOSIS — M6281 Muscle weakness (generalized): Secondary | ICD-10-CM | POA: Diagnosis not present

## 2017-09-04 DIAGNOSIS — R293 Abnormal posture: Secondary | ICD-10-CM | POA: Diagnosis not present

## 2017-09-04 DIAGNOSIS — R2681 Unsteadiness on feet: Secondary | ICD-10-CM | POA: Diagnosis not present

## 2017-09-05 DIAGNOSIS — M6281 Muscle weakness (generalized): Secondary | ICD-10-CM | POA: Diagnosis not present

## 2017-09-05 DIAGNOSIS — R2681 Unsteadiness on feet: Secondary | ICD-10-CM | POA: Diagnosis not present

## 2017-09-05 DIAGNOSIS — R278 Other lack of coordination: Secondary | ICD-10-CM | POA: Diagnosis not present

## 2017-09-05 DIAGNOSIS — R293 Abnormal posture: Secondary | ICD-10-CM | POA: Diagnosis not present

## 2017-09-06 DIAGNOSIS — R278 Other lack of coordination: Secondary | ICD-10-CM | POA: Diagnosis not present

## 2017-09-06 DIAGNOSIS — M6281 Muscle weakness (generalized): Secondary | ICD-10-CM | POA: Diagnosis not present

## 2017-09-06 DIAGNOSIS — R2681 Unsteadiness on feet: Secondary | ICD-10-CM | POA: Diagnosis not present

## 2017-09-06 DIAGNOSIS — R293 Abnormal posture: Secondary | ICD-10-CM | POA: Diagnosis not present

## 2017-09-07 DIAGNOSIS — R2681 Unsteadiness on feet: Secondary | ICD-10-CM | POA: Diagnosis not present

## 2017-09-07 DIAGNOSIS — R278 Other lack of coordination: Secondary | ICD-10-CM | POA: Diagnosis not present

## 2017-09-07 DIAGNOSIS — M6281 Muscle weakness (generalized): Secondary | ICD-10-CM | POA: Diagnosis not present

## 2017-09-07 DIAGNOSIS — R293 Abnormal posture: Secondary | ICD-10-CM | POA: Diagnosis not present

## 2017-09-10 DIAGNOSIS — R293 Abnormal posture: Secondary | ICD-10-CM | POA: Diagnosis not present

## 2017-09-10 DIAGNOSIS — R278 Other lack of coordination: Secondary | ICD-10-CM | POA: Diagnosis not present

## 2017-09-10 DIAGNOSIS — M6281 Muscle weakness (generalized): Secondary | ICD-10-CM | POA: Diagnosis not present

## 2017-09-10 DIAGNOSIS — R2681 Unsteadiness on feet: Secondary | ICD-10-CM | POA: Diagnosis not present

## 2017-09-11 DIAGNOSIS — R293 Abnormal posture: Secondary | ICD-10-CM | POA: Diagnosis not present

## 2017-09-11 DIAGNOSIS — R278 Other lack of coordination: Secondary | ICD-10-CM | POA: Diagnosis not present

## 2017-09-11 DIAGNOSIS — R2681 Unsteadiness on feet: Secondary | ICD-10-CM | POA: Diagnosis not present

## 2017-09-11 DIAGNOSIS — M6281 Muscle weakness (generalized): Secondary | ICD-10-CM | POA: Diagnosis not present

## 2017-09-13 DIAGNOSIS — R293 Abnormal posture: Secondary | ICD-10-CM | POA: Diagnosis not present

## 2017-09-13 DIAGNOSIS — R278 Other lack of coordination: Secondary | ICD-10-CM | POA: Diagnosis not present

## 2017-09-13 DIAGNOSIS — M6281 Muscle weakness (generalized): Secondary | ICD-10-CM | POA: Diagnosis not present

## 2017-09-13 DIAGNOSIS — R2681 Unsteadiness on feet: Secondary | ICD-10-CM | POA: Diagnosis not present

## 2017-09-14 DIAGNOSIS — R2681 Unsteadiness on feet: Secondary | ICD-10-CM | POA: Diagnosis not present

## 2017-09-14 DIAGNOSIS — R293 Abnormal posture: Secondary | ICD-10-CM | POA: Diagnosis not present

## 2017-09-14 DIAGNOSIS — M6281 Muscle weakness (generalized): Secondary | ICD-10-CM | POA: Diagnosis not present

## 2017-09-14 DIAGNOSIS — R278 Other lack of coordination: Secondary | ICD-10-CM | POA: Diagnosis not present

## 2017-09-18 DIAGNOSIS — R278 Other lack of coordination: Secondary | ICD-10-CM | POA: Diagnosis not present

## 2017-09-18 DIAGNOSIS — M6281 Muscle weakness (generalized): Secondary | ICD-10-CM | POA: Diagnosis not present

## 2017-09-18 DIAGNOSIS — R2681 Unsteadiness on feet: Secondary | ICD-10-CM | POA: Diagnosis not present

## 2017-09-18 DIAGNOSIS — R293 Abnormal posture: Secondary | ICD-10-CM | POA: Diagnosis not present

## 2017-09-19 DIAGNOSIS — R278 Other lack of coordination: Secondary | ICD-10-CM | POA: Diagnosis not present

## 2017-09-19 DIAGNOSIS — R293 Abnormal posture: Secondary | ICD-10-CM | POA: Diagnosis not present

## 2017-09-19 DIAGNOSIS — R2681 Unsteadiness on feet: Secondary | ICD-10-CM | POA: Diagnosis not present

## 2017-09-19 DIAGNOSIS — M6281 Muscle weakness (generalized): Secondary | ICD-10-CM | POA: Diagnosis not present

## 2017-09-24 DIAGNOSIS — R293 Abnormal posture: Secondary | ICD-10-CM | POA: Diagnosis not present

## 2017-09-24 DIAGNOSIS — R2681 Unsteadiness on feet: Secondary | ICD-10-CM | POA: Diagnosis not present

## 2017-09-24 DIAGNOSIS — R278 Other lack of coordination: Secondary | ICD-10-CM | POA: Diagnosis not present

## 2017-09-24 DIAGNOSIS — M6281 Muscle weakness (generalized): Secondary | ICD-10-CM | POA: Diagnosis not present

## 2017-09-26 DIAGNOSIS — R2681 Unsteadiness on feet: Secondary | ICD-10-CM | POA: Diagnosis not present

## 2017-09-26 DIAGNOSIS — M6281 Muscle weakness (generalized): Secondary | ICD-10-CM | POA: Diagnosis not present

## 2017-09-26 DIAGNOSIS — R293 Abnormal posture: Secondary | ICD-10-CM | POA: Diagnosis not present

## 2017-09-26 DIAGNOSIS — R278 Other lack of coordination: Secondary | ICD-10-CM | POA: Diagnosis not present

## 2018-05-01 DIAGNOSIS — Z23 Encounter for immunization: Secondary | ICD-10-CM | POA: Diagnosis not present

## 2018-07-15 DIAGNOSIS — K5900 Constipation, unspecified: Secondary | ICD-10-CM | POA: Diagnosis not present

## 2018-07-15 DIAGNOSIS — F418 Other specified anxiety disorders: Secondary | ICD-10-CM | POA: Diagnosis not present

## 2018-07-15 DIAGNOSIS — G309 Alzheimer's disease, unspecified: Secondary | ICD-10-CM | POA: Diagnosis not present

## 2018-07-15 DIAGNOSIS — E785 Hyperlipidemia, unspecified: Secondary | ICD-10-CM | POA: Diagnosis not present

## 2018-07-15 DIAGNOSIS — N390 Urinary tract infection, site not specified: Secondary | ICD-10-CM | POA: Diagnosis not present

## 2018-07-15 DIAGNOSIS — F0281 Dementia in other diseases classified elsewhere with behavioral disturbance: Secondary | ICD-10-CM | POA: Diagnosis not present

## 2018-07-15 DIAGNOSIS — I10 Essential (primary) hypertension: Secondary | ICD-10-CM | POA: Diagnosis not present

## 2018-07-15 DIAGNOSIS — F482 Pseudobulbar affect: Secondary | ICD-10-CM | POA: Diagnosis not present

## 2018-07-16 DIAGNOSIS — E785 Hyperlipidemia, unspecified: Secondary | ICD-10-CM | POA: Diagnosis not present

## 2018-07-16 DIAGNOSIS — I10 Essential (primary) hypertension: Secondary | ICD-10-CM | POA: Diagnosis not present

## 2018-07-16 DIAGNOSIS — F418 Other specified anxiety disorders: Secondary | ICD-10-CM | POA: Diagnosis not present

## 2018-07-16 DIAGNOSIS — G309 Alzheimer's disease, unspecified: Secondary | ICD-10-CM | POA: Diagnosis not present

## 2018-07-16 DIAGNOSIS — F482 Pseudobulbar affect: Secondary | ICD-10-CM | POA: Diagnosis not present

## 2018-07-16 DIAGNOSIS — F0281 Dementia in other diseases classified elsewhere with behavioral disturbance: Secondary | ICD-10-CM | POA: Diagnosis not present

## 2018-07-17 DIAGNOSIS — F482 Pseudobulbar affect: Secondary | ICD-10-CM | POA: Diagnosis not present

## 2018-07-17 DIAGNOSIS — G309 Alzheimer's disease, unspecified: Secondary | ICD-10-CM | POA: Diagnosis not present

## 2018-07-17 DIAGNOSIS — E785 Hyperlipidemia, unspecified: Secondary | ICD-10-CM | POA: Diagnosis not present

## 2018-07-17 DIAGNOSIS — I10 Essential (primary) hypertension: Secondary | ICD-10-CM | POA: Diagnosis not present

## 2018-07-17 DIAGNOSIS — F0281 Dementia in other diseases classified elsewhere with behavioral disturbance: Secondary | ICD-10-CM | POA: Diagnosis not present

## 2018-07-17 DIAGNOSIS — F418 Other specified anxiety disorders: Secondary | ICD-10-CM | POA: Diagnosis not present

## 2018-07-19 DIAGNOSIS — F482 Pseudobulbar affect: Secondary | ICD-10-CM | POA: Diagnosis not present

## 2018-07-19 DIAGNOSIS — G309 Alzheimer's disease, unspecified: Secondary | ICD-10-CM | POA: Diagnosis not present

## 2018-07-19 DIAGNOSIS — F418 Other specified anxiety disorders: Secondary | ICD-10-CM | POA: Diagnosis not present

## 2018-07-19 DIAGNOSIS — I10 Essential (primary) hypertension: Secondary | ICD-10-CM | POA: Diagnosis not present

## 2018-07-19 DIAGNOSIS — E785 Hyperlipidemia, unspecified: Secondary | ICD-10-CM | POA: Diagnosis not present

## 2018-07-19 DIAGNOSIS — F0281 Dementia in other diseases classified elsewhere with behavioral disturbance: Secondary | ICD-10-CM | POA: Diagnosis not present

## 2018-07-22 DIAGNOSIS — F482 Pseudobulbar affect: Secondary | ICD-10-CM | POA: Diagnosis not present

## 2018-07-22 DIAGNOSIS — F418 Other specified anxiety disorders: Secondary | ICD-10-CM | POA: Diagnosis not present

## 2018-07-22 DIAGNOSIS — F0281 Dementia in other diseases classified elsewhere with behavioral disturbance: Secondary | ICD-10-CM | POA: Diagnosis not present

## 2018-07-22 DIAGNOSIS — G309 Alzheimer's disease, unspecified: Secondary | ICD-10-CM | POA: Diagnosis not present

## 2018-07-22 DIAGNOSIS — E785 Hyperlipidemia, unspecified: Secondary | ICD-10-CM | POA: Diagnosis not present

## 2018-07-22 DIAGNOSIS — I10 Essential (primary) hypertension: Secondary | ICD-10-CM | POA: Diagnosis not present

## 2018-07-23 DIAGNOSIS — G309 Alzheimer's disease, unspecified: Secondary | ICD-10-CM | POA: Diagnosis not present

## 2018-07-23 DIAGNOSIS — F418 Other specified anxiety disorders: Secondary | ICD-10-CM | POA: Diagnosis not present

## 2018-07-23 DIAGNOSIS — I10 Essential (primary) hypertension: Secondary | ICD-10-CM | POA: Diagnosis not present

## 2018-07-23 DIAGNOSIS — F482 Pseudobulbar affect: Secondary | ICD-10-CM | POA: Diagnosis not present

## 2018-07-23 DIAGNOSIS — E785 Hyperlipidemia, unspecified: Secondary | ICD-10-CM | POA: Diagnosis not present

## 2018-07-23 DIAGNOSIS — F0281 Dementia in other diseases classified elsewhere with behavioral disturbance: Secondary | ICD-10-CM | POA: Diagnosis not present

## 2018-07-24 DIAGNOSIS — G309 Alzheimer's disease, unspecified: Secondary | ICD-10-CM | POA: Diagnosis not present

## 2018-07-24 DIAGNOSIS — F418 Other specified anxiety disorders: Secondary | ICD-10-CM | POA: Diagnosis not present

## 2018-07-24 DIAGNOSIS — F0281 Dementia in other diseases classified elsewhere with behavioral disturbance: Secondary | ICD-10-CM | POA: Diagnosis not present

## 2018-07-24 DIAGNOSIS — I10 Essential (primary) hypertension: Secondary | ICD-10-CM | POA: Diagnosis not present

## 2018-07-24 DIAGNOSIS — E785 Hyperlipidemia, unspecified: Secondary | ICD-10-CM | POA: Diagnosis not present

## 2018-07-24 DIAGNOSIS — F482 Pseudobulbar affect: Secondary | ICD-10-CM | POA: Diagnosis not present

## 2018-08-11 DEATH — deceased

## 2019-01-09 ENCOUNTER — Other Ambulatory Visit: Payer: Self-pay
